# Patient Record
Sex: Male | Born: 1940
Health system: Southern US, Community
[De-identification: ages and names within clinical notes are randomized; demographics above are authoritative.]

## PROBLEM LIST (undated history)

## (undated) DIAGNOSIS — K219 Gastro-esophageal reflux disease without esophagitis: Secondary | ICD-10-CM

## (undated) DIAGNOSIS — E8809 Other disorders of plasma-protein metabolism, not elsewhere classified: Secondary | ICD-10-CM

## (undated) DIAGNOSIS — Z8719 Personal history of other diseases of the digestive system: Secondary | ICD-10-CM

## (undated) DIAGNOSIS — K2270 Barrett's esophagus without dysplasia: Secondary | ICD-10-CM

## (undated) DIAGNOSIS — R3915 Urgency of urination: Secondary | ICD-10-CM

## (undated) DIAGNOSIS — R3129 Other microscopic hematuria: Secondary | ICD-10-CM

## (undated) DIAGNOSIS — E785 Hyperlipidemia, unspecified: Secondary | ICD-10-CM

## (undated) DIAGNOSIS — R1319 Other dysphagia: Secondary | ICD-10-CM

## (undated) DIAGNOSIS — D099 Carcinoma in situ, unspecified: Secondary | ICD-10-CM

## (undated) DIAGNOSIS — R9389 Abnormal findings on diagnostic imaging of other specified body structures: Secondary | ICD-10-CM

## (undated) DIAGNOSIS — F0390 Unspecified dementia without behavioral disturbance: Secondary | ICD-10-CM

## (undated) DIAGNOSIS — N289 Disorder of kidney and ureter, unspecified: Secondary | ICD-10-CM

## (undated) DIAGNOSIS — D801 Nonfamilial hypogammaglobulinemia: Secondary | ICD-10-CM

## (undated) DIAGNOSIS — D494 Neoplasm of unspecified behavior of bladder: Secondary | ICD-10-CM

## (undated) DIAGNOSIS — C4491 Basal cell carcinoma of skin, unspecified: Secondary | ICD-10-CM

## (undated) DIAGNOSIS — R351 Nocturia: Secondary | ICD-10-CM

## (undated) DIAGNOSIS — Z972 Presence of dental prosthetic device (complete) (partial): Secondary | ICD-10-CM

## (undated) DIAGNOSIS — E538 Deficiency of other specified B group vitamins: Secondary | ICD-10-CM

## (undated) DIAGNOSIS — B370 Candidal stomatitis: Secondary | ICD-10-CM

## (undated) HISTORY — PX: ESOPHAGOGASTRODUODENOSCOPY: SHX1529

## (undated) HISTORY — DX: Nonfamilial hypogammaglobulinemia: D80.1

## (undated) HISTORY — PX: KNEE ARTHROSCOPY: SUR90

## (undated) HISTORY — DX: Gastro-esophageal reflux disease without esophagitis: K21.9

## (undated) HISTORY — PX: COLONOSCOPY: SHX174

## (undated) HISTORY — DX: Basal cell carcinoma of skin, unspecified: C44.91

## (undated) HISTORY — DX: Unspecified dementia, unspecified severity, without behavioral disturbance, psychotic disturbance, mood disturbance, and anxiety: F03.90

## (undated) HISTORY — DX: Hyperlipidemia, unspecified: E78.5

## (undated) HISTORY — DX: Other disorders of plasma-protein metabolism, not elsewhere classified: E88.09

## (undated) HISTORY — DX: Barrett's esophagus without dysplasia: K22.70

## (undated) HISTORY — PX: TONSILLECTOMY: SUR1361

## (undated) HISTORY — DX: Carcinoma in situ, unspecified: D09.9

## (undated) HISTORY — DX: Candidal stomatitis: B37.0

## (undated) HISTORY — DX: Disorder of kidney and ureter, unspecified: N28.9

## (undated) HISTORY — DX: Presence of dental prosthetic device (complete) (partial): Z97.2

---

## 2000-02-19 ENCOUNTER — Encounter (INDEPENDENT_AMBULATORY_CARE_PROVIDER_SITE_OTHER): Payer: Self-pay | Admitting: Specialist

## 2000-02-19 ENCOUNTER — Ambulatory Visit (HOSPITAL_COMMUNITY): Admission: RE | Admit: 2000-02-19 | Discharge: 2000-02-19 | Payer: Self-pay | Admitting: Gastroenterology

## 2000-05-13 ENCOUNTER — Emergency Department (HOSPITAL_COMMUNITY): Admission: EM | Admit: 2000-05-13 | Discharge: 2000-05-13 | Payer: Self-pay | Admitting: Emergency Medicine

## 2000-05-15 ENCOUNTER — Encounter: Payer: Self-pay | Admitting: Family Medicine

## 2000-05-15 ENCOUNTER — Encounter: Admission: RE | Admit: 2000-05-15 | Discharge: 2000-05-15 | Payer: Self-pay | Admitting: Family Medicine

## 2001-12-20 ENCOUNTER — Encounter: Payer: Self-pay | Admitting: Family Medicine

## 2001-12-20 ENCOUNTER — Encounter: Admission: RE | Admit: 2001-12-20 | Discharge: 2001-12-20 | Payer: Self-pay | Admitting: Family Medicine

## 2005-09-02 ENCOUNTER — Encounter (INDEPENDENT_AMBULATORY_CARE_PROVIDER_SITE_OTHER): Payer: Self-pay | Admitting: Specialist

## 2005-09-02 ENCOUNTER — Ambulatory Visit (HOSPITAL_COMMUNITY): Admission: RE | Admit: 2005-09-02 | Discharge: 2005-09-02 | Payer: Self-pay | Admitting: Gastroenterology

## 2006-10-06 ENCOUNTER — Encounter: Admission: RE | Admit: 2006-10-06 | Discharge: 2006-10-06 | Payer: Self-pay | Admitting: Family Medicine

## 2006-10-08 ENCOUNTER — Encounter: Admission: RE | Admit: 2006-10-08 | Discharge: 2006-10-08 | Payer: Self-pay | Admitting: Family Medicine

## 2006-10-14 ENCOUNTER — Encounter: Admission: RE | Admit: 2006-10-14 | Discharge: 2006-10-14 | Payer: Self-pay | Admitting: Family Medicine

## 2006-10-16 ENCOUNTER — Ambulatory Visit: Payer: Self-pay | Admitting: Critical Care Medicine

## 2006-11-02 ENCOUNTER — Ambulatory Visit: Payer: Self-pay | Admitting: Critical Care Medicine

## 2006-11-12 ENCOUNTER — Encounter: Admission: RE | Admit: 2006-11-12 | Discharge: 2006-11-12 | Payer: Self-pay | Admitting: Family Medicine

## 2006-12-07 ENCOUNTER — Ambulatory Visit (HOSPITAL_COMMUNITY): Admission: RE | Admit: 2006-12-07 | Discharge: 2006-12-07 | Payer: Self-pay | Admitting: Gastroenterology

## 2008-01-26 ENCOUNTER — Encounter: Admission: RE | Admit: 2008-01-26 | Discharge: 2008-01-26 | Payer: Self-pay | Admitting: Family Medicine

## 2009-05-02 ENCOUNTER — Ambulatory Visit (HOSPITAL_COMMUNITY): Admission: RE | Admit: 2009-05-02 | Discharge: 2009-05-02 | Payer: Self-pay | Admitting: Neurology

## 2009-05-02 ENCOUNTER — Ambulatory Visit: Payer: Self-pay | Admitting: Vascular Surgery

## 2009-05-02 ENCOUNTER — Encounter (INDEPENDENT_AMBULATORY_CARE_PROVIDER_SITE_OTHER): Payer: Self-pay | Admitting: Neurology

## 2010-09-01 ENCOUNTER — Encounter: Payer: Self-pay | Admitting: Neurology

## 2010-12-27 NOTE — Op Note (Signed)
NAMELANORRIS, KALISZ             ACCOUNT NO.:  [id number]   MEDICAL RECORD NO.:  [id number]          PATIENT TYPE:  AMB   LOCATION:  ENDO                         FACILITY:  [facility]   PHYSICIAN:  Petra Kuba, M.D.    DATE OF BIRTH:  [birth year]   DATE OF PROCEDURE:  09/02/2005  DATE OF DISCHARGE:                                 OPERATIVE REPORT   PROCEDURE:  Colonoscopy with biopsy.   INDICATIONS:  Family history of colon cancer.  Personal history of colon  polyps.  Abdominal pain.  Due for colonic screening.  Consent was signed  after risks, benefits, options were thoroughly discussed in the office on  multiple occasions.   PREMEDICATIONS:  Fentanyl  75 mcg, Versed 5 mg.   DESCRIPTION OF PROCEDURE:  Rectal inspection pertinent for external  hemorrhoids, small.  Digital exam was negative.  Video pediatric adjustable  colonoscope was inserted and easily advanced around the colon to the mid  ascending. At that point the scope began to loop.  With abdominal pressure  and rolling him on his back, we were able to advance to the cecum.  Interestingly he had a few right-sided diverticula on insertion as well as a  few on the left, but no other abnormalities on insertion.  Cecum was  identified by the appendiceal orifice and the ileocecal valve.  In fact, the  scope was inserted a short ways into the terminal ileum which was normal.  Further documentation was obtained.  The scope was slowly withdrawn.  Prep  was adequate.  There was some liquid stool that required washing and  suctioning.  In the cecal pole, a questionable __________ small bowel was  seen and was cold biopsied x3 and put in the first container.  The  diverticula were primarily in the cecum and the proximal ascending.  There  were no diverticula seen until the sigmoid.  He did have two other  questionable tiny polyps in both the mid descending and transverse, both of  which were cold biopsied and put in the second  container.  No other  abnormalities were seen.  We slowly withdrew back to the rectum.  Anorectal  pull-through and retroflexion confirmed some small hemorrhoids.  The scope  was straightened and readvanced a short ways up the left side of the colon.  Air was suctioned and the scope removed.  The patient tolerated the  procedure well.  There were no obvious immediate complications.   ENDOSCOPIC DIAGNOSES:  1.  Small internal and external hemorrhoids.  2.  Left sigmoid few diverticula.  3.  Cecal and ascending proximal, a few diverticula.  4.  A few questionable tiny to small polyps in the descending, transverse      and cecum, all cold biopsied.  5.  Otherwise within normal limits to the cecum and a quick look at the      terminal ileum.   PLAN:  Await pathology.  Will probably recheck colon screening in five  years.  Consider CT scan next if his pain continues either by me or Dr.  Artis Flock.  Happy to see him  back p.r.n.           ______________________________  Petra Kuba, M.D.     MEM/MEDQ  D:  09/02/2005  T:  09/02/2005  Job:  [id number]   cc:   Quita Skye. Artis Flock, M.D.  Fax: [phone]

## 2010-12-27 NOTE — Procedures (Signed)
[facility]  Patient:    Andrew Osborne, Andrew Osborne                    MRN: [id number] Proc. Date: 02/19/00 Adm. Date:  [id number] Disc. Date: [id number] Attending:  Nelda Marseille CC:         Fayne Norrie, M.D.                           Procedure Report  PROCEDURE:  Colonoscopy with biopsy.  INDICATIONS:  Family history of colon cancer and colon polyps due for colonic screening.  Consent was signed after risks, benefits, methods, and options were thoroughly discussed in the office.  MEDICINES USED:  Demerol 75, Versed 6.  DESCRIPTION OF PROCEDURE:  Rectal inspection was pertinent for external hemorrhoids.  Digital exam was negative.  Video colonoscope was inserted and easily advanced around the colon to the cecum.  This did not require any abdominal pressure or any position changes.  The cecum was identified by the appendiceal orifice and the ileocecal valve.  In fact, the scope was inserted a short ways into the terminal ileum which was normal on insertion in the distal sigmoid.  A tiny 1-2 mm polyp was seen and was cold biopsied x 2. Other than some rare left-sided and right-sided diverticula, no other abnormalities were seen.  The scope was slowly withdrawn.  The terminal ileum was normal. Prep was adequate.  There was some liquid stool and bubbles that required washing and suctioning.  On slow withdrawal back to the sigmoid, no additional findings were seen other than the diverticula.  There was an additional tiny sigmoid polyp which was cold-biopsied x 1 and put in the same container.  Once back in the rectum, the scope was retroflexed.  Pertinent for some internal hemorrhoids.  The scope was then straightened and advanced a short ways up the sigmoid.  Air was suctioned and the scope removed.  The patient tolerated the procedure well.  There was no obvious immediate complications.  ENDOSCOPIC DIAGNOSES: 1. Internal and external  hemorrhoids. 2. Occasional left and right diverticula. 3. Two tiny distal sigmoid polyps, status-post cold biopsy. 4. Otherwise within normal limits to the cecum and terminal ileum.  PLAN:  Yearly rectals and guaiacs per Dr. Ardyth Gal.  I will be happy to see pack p.r.n. but probably recheck in five years pending pathology.  I have rediscussed Gilberts with him and tried to explain it better.  If you would like for me to continue to be sure that is what he has, although that is from about 99.9% sure.  I have asked him to call Dr. Artis Flock and ask him to fax whatever old liver tests he has to me for my evaluation.  I will be happy to see back sooner, as above p.r.n. DD:  02/19/00 TD:  02/19/00 Job: [id number]

## 2010-12-27 NOTE — Assessment & Plan Note (Signed)
[facility]                             PULMONARY OFFICE NOTE   NAME:CONNOLLYBonner, Larue                    MRN:          [id number]  DATE:11/02/2006                            DOB:          11/02/1940    Mr. Palardy is a 70 year old white male who has a history of shortness  of breath, cough and dysphagia. During his last visit, we were concerned  he was perhaps micro aspirating. He was placed on a course of Cleocin  for 10 days at 300 mg three times daily and pulsed prednisone with taper  and Advair 500/50 one spray b.i.d. In the interim, his cough has  cleared. Mucus has cleared and his shortness of breath is better. He has  seen Dr. Leary Roca who investigating malabsorption issues and vitamin  B12 issues, but did not specifically address the dysphagia as we had  hoped for. We had originally referred him to [facility], but he has been referred instead to Dr. Ewing Schlein. I  will attempt to communicate to Dr. Ewing Schlein our concerns about his  dysphagia.   PHYSICAL EXAMINATION:  Temperature 98.0, blood pressure 128/84, pulse  86. Saturation is 94% on room air.  CHEST: Showed to be clear without evidence wheeze or rhonchi.  CARDIAC: Showed a regular rate and rhythm without S3. Normal S1, S2.  ABDOMEN: Soft, nontender.  EXTREMITIES: Showed no edema or clubbing or venous disease.  SKIN: Was clear.  NEUROLOGIC: Was intact.   Chest x-ray obtained today showed complete clearance of bilateral lower  lobe infiltrates.   IMPRESSION:  Is that of micro aspiration from chronic dysphagia. I worry  about esophageal stricture or some other primary esophageal disorder  with resultant chronic aspiration. He has certainly cleared his airways  now and no further indication for bronchoscopy is here.   PLAN:  The plan is to maintain Advair as currently dosed and no further  antibiotics or prednisone is necessary. Will see the patient back in a  months'  time. We will communicate our concerns with Dr. Ewing Schlein.     Charlcie Cradle Delford Field, MD, [facility]  Electronically Signed    PEW/MedQ  DD: 11/03/2006  DT: 11/03/2006  Job #: [id number]   cc:   Petra Kuba, M.D.  Quita Skye Artis Flock, M.D.

## 2011-07-15 HISTORY — PX: CATARACT EXTRACTION W/ INTRAOCULAR LENS IMPLANT: SHX1309

## 2011-08-27 DIAGNOSIS — R7301 Impaired fasting glucose: Secondary | ICD-10-CM | POA: Diagnosis not present

## 2011-08-27 DIAGNOSIS — E785 Hyperlipidemia, unspecified: Secondary | ICD-10-CM | POA: Diagnosis not present

## 2011-08-27 DIAGNOSIS — Z125 Encounter for screening for malignant neoplasm of prostate: Secondary | ICD-10-CM | POA: Diagnosis not present

## 2011-09-03 DIAGNOSIS — E785 Hyperlipidemia, unspecified: Secondary | ICD-10-CM | POA: Diagnosis not present

## 2011-09-03 DIAGNOSIS — R7301 Impaired fasting glucose: Secondary | ICD-10-CM | POA: Diagnosis not present

## 2011-09-03 DIAGNOSIS — K219 Gastro-esophageal reflux disease without esophagitis: Secondary | ICD-10-CM | POA: Diagnosis not present

## 2011-09-03 DIAGNOSIS — N183 Chronic kidney disease, stage 3 unspecified: Secondary | ICD-10-CM | POA: Diagnosis not present

## 2011-09-03 DIAGNOSIS — E538 Deficiency of other specified B group vitamins: Secondary | ICD-10-CM | POA: Diagnosis not present

## 2011-10-06 DIAGNOSIS — E538 Deficiency of other specified B group vitamins: Secondary | ICD-10-CM | POA: Diagnosis not present

## 2011-10-06 DIAGNOSIS — R609 Edema, unspecified: Secondary | ICD-10-CM | POA: Diagnosis not present

## 2011-11-11 DIAGNOSIS — E538 Deficiency of other specified B group vitamins: Secondary | ICD-10-CM | POA: Diagnosis not present

## 2011-11-17 ENCOUNTER — Other Ambulatory Visit: Payer: Self-pay | Admitting: Dermatology

## 2011-11-17 DIAGNOSIS — D485 Neoplasm of uncertain behavior of skin: Secondary | ICD-10-CM | POA: Diagnosis not present

## 2011-11-17 DIAGNOSIS — L851 Acquired keratosis [keratoderma] palmaris et plantaris: Secondary | ICD-10-CM | POA: Diagnosis not present

## 2011-11-17 DIAGNOSIS — D239 Other benign neoplasm of skin, unspecified: Secondary | ICD-10-CM | POA: Diagnosis not present

## 2011-12-12 DIAGNOSIS — E538 Deficiency of other specified B group vitamins: Secondary | ICD-10-CM | POA: Diagnosis not present

## 2012-01-09 DIAGNOSIS — E538 Deficiency of other specified B group vitamins: Secondary | ICD-10-CM | POA: Diagnosis not present

## 2012-02-11 DIAGNOSIS — E538 Deficiency of other specified B group vitamins: Secondary | ICD-10-CM | POA: Diagnosis not present

## 2012-03-10 DIAGNOSIS — E538 Deficiency of other specified B group vitamins: Secondary | ICD-10-CM | POA: Diagnosis not present

## 2012-03-12 DIAGNOSIS — R7301 Impaired fasting glucose: Secondary | ICD-10-CM | POA: Diagnosis not present

## 2012-03-12 DIAGNOSIS — E785 Hyperlipidemia, unspecified: Secondary | ICD-10-CM | POA: Diagnosis not present

## 2012-03-12 DIAGNOSIS — E538 Deficiency of other specified B group vitamins: Secondary | ICD-10-CM | POA: Diagnosis not present

## 2012-03-24 DIAGNOSIS — E785 Hyperlipidemia, unspecified: Secondary | ICD-10-CM | POA: Diagnosis not present

## 2012-03-24 DIAGNOSIS — R7301 Impaired fasting glucose: Secondary | ICD-10-CM | POA: Diagnosis not present

## 2012-03-24 DIAGNOSIS — K219 Gastro-esophageal reflux disease without esophagitis: Secondary | ICD-10-CM | POA: Diagnosis not present

## 2012-03-24 DIAGNOSIS — N183 Chronic kidney disease, stage 3 unspecified: Secondary | ICD-10-CM | POA: Diagnosis not present

## 2012-04-02 DIAGNOSIS — H04129 Dry eye syndrome of unspecified lacrimal gland: Secondary | ICD-10-CM | POA: Diagnosis not present

## 2012-04-02 DIAGNOSIS — H04209 Unspecified epiphora, unspecified lacrimal gland: Secondary | ICD-10-CM | POA: Diagnosis not present

## 2012-04-02 DIAGNOSIS — T8529XA Other mechanical complication of intraocular lens, initial encounter: Secondary | ICD-10-CM | POA: Diagnosis not present

## 2012-04-02 DIAGNOSIS — H11829 Conjunctivochalasis, unspecified eye: Secondary | ICD-10-CM | POA: Diagnosis not present

## 2012-04-02 DIAGNOSIS — H02839 Dermatochalasis of unspecified eye, unspecified eyelid: Secondary | ICD-10-CM | POA: Diagnosis not present

## 2012-04-02 DIAGNOSIS — H35379 Puckering of macula, unspecified eye: Secondary | ICD-10-CM | POA: Diagnosis not present

## 2012-04-02 DIAGNOSIS — H26499 Other secondary cataract, unspecified eye: Secondary | ICD-10-CM | POA: Diagnosis not present

## 2012-04-02 DIAGNOSIS — H2640 Unspecified secondary cataract: Secondary | ICD-10-CM | POA: Diagnosis not present

## 2012-04-02 DIAGNOSIS — H2589 Other age-related cataract: Secondary | ICD-10-CM | POA: Diagnosis not present

## 2012-04-02 DIAGNOSIS — H0289 Other specified disorders of eyelid: Secondary | ICD-10-CM | POA: Diagnosis not present

## 2012-04-21 ENCOUNTER — Other Ambulatory Visit: Payer: Self-pay | Admitting: Dermatology

## 2012-04-21 DIAGNOSIS — L259 Unspecified contact dermatitis, unspecified cause: Secondary | ICD-10-CM | POA: Diagnosis not present

## 2012-04-21 DIAGNOSIS — R1310 Dysphagia, unspecified: Secondary | ICD-10-CM | POA: Diagnosis not present

## 2012-04-21 DIAGNOSIS — K219 Gastro-esophageal reflux disease without esophagitis: Secondary | ICD-10-CM | POA: Diagnosis not present

## 2012-04-21 DIAGNOSIS — D485 Neoplasm of uncertain behavior of skin: Secondary | ICD-10-CM | POA: Diagnosis not present

## 2012-04-21 DIAGNOSIS — L570 Actinic keratosis: Secondary | ICD-10-CM | POA: Diagnosis not present

## 2012-04-21 DIAGNOSIS — L851 Acquired keratosis [keratoderma] palmaris et plantaris: Secondary | ICD-10-CM | POA: Diagnosis not present

## 2012-04-22 ENCOUNTER — Other Ambulatory Visit: Payer: Self-pay | Admitting: Otolaryngology

## 2012-04-22 DIAGNOSIS — R1310 Dysphagia, unspecified: Secondary | ICD-10-CM

## 2012-04-29 ENCOUNTER — Ambulatory Visit
Admission: RE | Admit: 2012-04-29 | Discharge: 2012-04-29 | Disposition: A | Discharge status: Home / Self Care | Payer: Medicare Other | Source: Ambulatory Visit | Attending: Otolaryngology | Admitting: Otolaryngology

## 2012-04-29 DIAGNOSIS — K449 Diaphragmatic hernia without obstruction or gangrene: Secondary | ICD-10-CM | POA: Diagnosis not present

## 2012-04-29 DIAGNOSIS — R1310 Dysphagia, unspecified: Secondary | ICD-10-CM

## 2012-05-07 DIAGNOSIS — R5381 Other malaise: Secondary | ICD-10-CM | POA: Diagnosis not present

## 2012-05-07 DIAGNOSIS — J069 Acute upper respiratory infection, unspecified: Secondary | ICD-10-CM | POA: Diagnosis not present

## 2012-05-07 DIAGNOSIS — R5383 Other fatigue: Secondary | ICD-10-CM | POA: Diagnosis not present

## 2012-05-07 DIAGNOSIS — R05 Cough: Secondary | ICD-10-CM | POA: Diagnosis not present

## 2012-05-07 DIAGNOSIS — J029 Acute pharyngitis, unspecified: Secondary | ICD-10-CM | POA: Diagnosis not present

## 2012-05-07 DIAGNOSIS — R059 Cough, unspecified: Secondary | ICD-10-CM | POA: Diagnosis not present

## 2012-05-20 ENCOUNTER — Other Ambulatory Visit: Payer: Self-pay | Admitting: Gastroenterology

## 2012-05-20 DIAGNOSIS — R1310 Dysphagia, unspecified: Secondary | ICD-10-CM | POA: Diagnosis not present

## 2012-05-20 DIAGNOSIS — K2100 Gastro-esophageal reflux disease with esophagitis, without bleeding: Secondary | ICD-10-CM | POA: Diagnosis not present

## 2012-05-20 DIAGNOSIS — K449 Diaphragmatic hernia without obstruction or gangrene: Secondary | ICD-10-CM | POA: Diagnosis not present

## 2012-05-20 DIAGNOSIS — Z86010 Personal history of colonic polyps: Secondary | ICD-10-CM | POA: Diagnosis not present

## 2012-05-20 DIAGNOSIS — Z09 Encounter for follow-up examination after completed treatment for conditions other than malignant neoplasm: Secondary | ICD-10-CM | POA: Diagnosis not present

## 2012-05-20 DIAGNOSIS — K5730 Diverticulosis of large intestine without perforation or abscess without bleeding: Secondary | ICD-10-CM | POA: Diagnosis not present

## 2012-05-20 DIAGNOSIS — Q398 Other congenital malformations of esophagus: Secondary | ICD-10-CM | POA: Diagnosis not present

## 2012-05-20 DIAGNOSIS — K2940 Chronic atrophic gastritis without bleeding: Secondary | ICD-10-CM | POA: Diagnosis not present

## 2012-05-24 DIAGNOSIS — E538 Deficiency of other specified B group vitamins: Secondary | ICD-10-CM | POA: Diagnosis not present

## 2012-06-23 DIAGNOSIS — E538 Deficiency of other specified B group vitamins: Secondary | ICD-10-CM | POA: Diagnosis not present

## 2012-07-26 DIAGNOSIS — E538 Deficiency of other specified B group vitamins: Secondary | ICD-10-CM | POA: Diagnosis not present

## 2012-07-26 DIAGNOSIS — Z23 Encounter for immunization: Secondary | ICD-10-CM | POA: Diagnosis not present

## 2012-08-17 DIAGNOSIS — L259 Unspecified contact dermatitis, unspecified cause: Secondary | ICD-10-CM | POA: Diagnosis not present

## 2012-08-17 DIAGNOSIS — D239 Other benign neoplasm of skin, unspecified: Secondary | ICD-10-CM | POA: Diagnosis not present

## 2012-08-25 DIAGNOSIS — E538 Deficiency of other specified B group vitamins: Secondary | ICD-10-CM | POA: Diagnosis not present

## 2012-08-27 DIAGNOSIS — N390 Urinary tract infection, site not specified: Secondary | ICD-10-CM | POA: Diagnosis not present

## 2012-08-27 DIAGNOSIS — E663 Overweight: Secondary | ICD-10-CM | POA: Diagnosis not present

## 2012-08-27 DIAGNOSIS — R7301 Impaired fasting glucose: Secondary | ICD-10-CM | POA: Diagnosis not present

## 2012-08-27 DIAGNOSIS — Z0000 Encounter for general adult medical examination without abnormal findings: Secondary | ICD-10-CM | POA: Diagnosis not present

## 2012-08-27 DIAGNOSIS — E538 Deficiency of other specified B group vitamins: Secondary | ICD-10-CM | POA: Diagnosis not present

## 2012-08-27 DIAGNOSIS — Z125 Encounter for screening for malignant neoplasm of prostate: Secondary | ICD-10-CM | POA: Diagnosis not present

## 2012-08-27 DIAGNOSIS — E785 Hyperlipidemia, unspecified: Secondary | ICD-10-CM | POA: Diagnosis not present

## 2012-09-08 ENCOUNTER — Other Ambulatory Visit: Payer: Self-pay | Admitting: Gastroenterology

## 2012-09-08 DIAGNOSIS — R7301 Impaired fasting glucose: Secondary | ICD-10-CM | POA: Diagnosis not present

## 2012-09-08 DIAGNOSIS — K219 Gastro-esophageal reflux disease without esophagitis: Secondary | ICD-10-CM | POA: Diagnosis not present

## 2012-09-08 DIAGNOSIS — R109 Unspecified abdominal pain: Secondary | ICD-10-CM | POA: Diagnosis not present

## 2012-09-08 DIAGNOSIS — R079 Chest pain, unspecified: Secondary | ICD-10-CM

## 2012-09-08 DIAGNOSIS — E785 Hyperlipidemia, unspecified: Secondary | ICD-10-CM | POA: Diagnosis not present

## 2012-09-08 DIAGNOSIS — E538 Deficiency of other specified B group vitamins: Secondary | ICD-10-CM | POA: Diagnosis not present

## 2012-09-08 DIAGNOSIS — R1310 Dysphagia, unspecified: Secondary | ICD-10-CM | POA: Diagnosis not present

## 2012-09-08 DIAGNOSIS — R82998 Other abnormal findings in urine: Secondary | ICD-10-CM | POA: Diagnosis not present

## 2012-09-09 ENCOUNTER — Ambulatory Visit
Admission: RE | Admit: 2012-09-09 | Discharge: 2012-09-09 | Disposition: A | Discharge status: Home / Self Care | Payer: Medicare Other | Source: Ambulatory Visit | Attending: Gastroenterology | Admitting: Gastroenterology

## 2012-09-09 DIAGNOSIS — J479 Bronchiectasis, uncomplicated: Secondary | ICD-10-CM | POA: Diagnosis not present

## 2012-09-09 DIAGNOSIS — K8020 Calculus of gallbladder without cholecystitis without obstruction: Secondary | ICD-10-CM | POA: Diagnosis not present

## 2012-09-09 DIAGNOSIS — R079 Chest pain, unspecified: Secondary | ICD-10-CM

## 2012-09-09 MED ORDER — IOHEXOL 300 MG/ML  SOLN
100.0000 mL | Freq: Once | INTRAMUSCULAR | Status: AC | PRN
  Administered 2012-09-09: 100 mL via INTRAVENOUS

## 2012-09-15 ENCOUNTER — Encounter (INDEPENDENT_AMBULATORY_CARE_PROVIDER_SITE_OTHER): Payer: Self-pay | Admitting: Surgery

## 2012-09-21 ENCOUNTER — Ambulatory Visit (INDEPENDENT_AMBULATORY_CARE_PROVIDER_SITE_OTHER): Payer: Medicare Other | Admitting: Surgery

## 2012-09-21 ENCOUNTER — Encounter (INDEPENDENT_AMBULATORY_CARE_PROVIDER_SITE_OTHER): Payer: Self-pay | Admitting: Surgery

## 2012-09-21 VITALS — BP 120/76 | HR 65 | Temp 99.2°F | Resp 18 | Ht 67.0 in | Wt 166.8 lb

## 2012-09-21 DIAGNOSIS — K8010 Calculus of gallbladder with chronic cholecystitis without obstruction: Secondary | ICD-10-CM | POA: Diagnosis not present

## 2012-09-21 DIAGNOSIS — E538 Deficiency of other specified B group vitamins: Secondary | ICD-10-CM | POA: Diagnosis not present

## 2012-09-21 NOTE — Progress Notes (Signed)
General [facility]  Chief Complaint  Patient presents with  . New Evaluation    eval gallstones - referral from Dr. Marc Magod    HISTORY: Patient is a 72-year-old white male referred by his gastroenterologist for evaluation for cholecystectomy for management of symptomatic cholelithiasis. Patient is symptomatic frequently with meals. While he does have some issues with dysphagia and gastroesophageal reflux, the patient describes epigastric abdominal pain. He denies nausea or vomiting. He denies jaundice or acholic stools. He has had no previous hepatobiliary or pancreatic disease. Patient has not had prior abdominal surgery.  Patient is under medical care by his gastroenterologist. He takes Prilosec twice daily. He has had an upper endoscopy and a barium swallow and a CT scan of the abdomen and pelvis. CT demonstrates multiple gallstones which have increased in number since the previous study in 2008.  Of note, the patient's brother recently underwent cholecystectomy.  Past Medical History  Diagnosis Date  . GERD (gastroesophageal reflux disease)   . Barrett's esophagus   . Hyperlipidemia      Current Outpatient Prescriptions  Medication Sig Dispense Refill  . omeprazole (PRILOSEC) 40 MG capsule Take 40 mg by mouth daily.      . sucralfate (CARAFATE) 1 G tablet Take 1 g by mouth 4 (four) times daily.      . Tamsulosin HCl (FLOMAX) 0.4 MG CAPS Take 0.4 mg by mouth daily.      . vitamin B-12 (CYANOCOBALAMIN) 1000 MCG tablet Take 1,000 mcg by mouth daily.      . aspirin 81 MG tablet Take 81 mg by mouth daily.      . fish oil-omega-3 fatty acids 1000 MG capsule Take 2 g by mouth daily.      . niacin 500 MG tablet Take 500 mg by mouth daily with breakfast.       No current facility-administered medications for this visit.     Allergies  Allergen Reactions  . Nizoral (Ketoconazole)      Family History  Problem Relation Age of Onset  . Cancer  Mother     Liver,Colon Cancer  . Alzheimer's disease Father      History   Social History  . Marital Status: Married    Spouse Name: N/A    Number of Children: N/A  . Years of Education: N/A   Social History Main Topics  . Smoking status: Never Smoker   . Smokeless tobacco: Never Used  . Alcohol Use: Yes  . Drug Use: No  . Sexually Active: None   Other Topics Concern  . None   Social History Narrative  . None     REVIEW OF SYSTEMS - PERTINENT POSITIVES ONLY: Denies jaundice. Denies acholic stools. Denies previous hepatobiliary or pancreatic disease.  EXAM: Filed Vitals:   09/21/12 0855  BP: 120/76  Pulse: 65  Temp: 99.2 F (37.3 C)  Resp: 18    HEENT: normocephalic; pupils equal and reactive; sclerae clear; dentition good; mucous membranes moist NECK:  symmetric on extension; no palpable anterior or posterior cervical lymphadenopathy; no supraclavicular masses; no tenderness CHEST: clear to auscultation bilaterally without rales, rhonchi, or wheezes CARDIAC: regular rate and rhythm without significant murmur; peripheral pulses are full ABDOMEN: soft without distension; bowel sounds present; no mass; no hepatosplenomegaly; no hernia EXT:  non-tender without edema; no deformity NEURO: no gross focal deficits; no sign of tremor   LABORATORY RESULTS: See [facility] (CHL-Epic) for most recent results   RADIOLOGY RESULTS: See   [facility] (CHL-Epic) for most recent results   IMPRESSION: #1 symptomatic cholelithiasis, probable chronic cholecystitis #2 gastroesophageal reflux disease #3 dysphagia  PLAN: The patient and I discussed the above findings at length. We reviewed his CT scan results. I provided him with written literature to review regarding cholecystectomy. We discussed the laparoscopic technique at length. We discussed his hospital stay to be anticipated. We discussed the possibility of symptom improvement but with persistent reflux  symptoms. Patient understands and wishes to proceed with laparoscopic cholecystectomy with intraoperative cholangiography in the near future. We will make arrangements for surgery.  The risks and benefits of the procedure have been discussed at length with the patient.  The patient understands the proposed procedure, potential alternative treatments, and the course of recovery to be expected.  All of the patient's questions have been answered at this time.  The patient wishes to proceed with surgery.  Latavia Goga M. Mell Mellott, MD, FACS General & Endocrine Surgery [facility]   Visit Diagnoses: 1. Cholelithiasis with cholecystitis     Primary Care Physician: MACKENZIE,BRIAN, MD   

## 2012-09-21 NOTE — Patient Instructions (Signed)
  [facility]  LAPAROSCOPIC SURGERY - POST-OP INSTRUCTIONS  Always review your discharge instruction sheet given to you by the facility where your surgery was performed.  A prescription for pain medication may be given to you upon discharge.  Take your pain medication as prescribed.  If narcotic pain medicine is not needed, then you may take acetaminophen (Tylenol) or ibuprofen (Advil) as needed.  Take your usually prescribed medications unless otherwise directed.  If you need a refill on your pain medication, please contact your pharmacy.  They will contact our office to request authorization. Prescriptions will not be filled after 5 P.M. or on weekends.  You should follow a light diet the first few days after arrival home, such as soup and crackers or toast.  Be sure to include plenty of fluids daily.  Most patients will experience some swelling and bruising in the area of the incisions.  Ice packs will help.  Swelling and bruising can take several days to resolve.   It is common to experience some constipation if taking pain medication after surgery.  Increasing fluid intake and taking a stool softener (such as Colace) will usually help or prevent this problem from occurring.  A mild laxative (Milk of Magnesia or Miralax) should be taken according to package instructions if there are no bowel movements after 48 hours.  Unless discharge instructions indicate otherwise, you may remove your bandages 24-48 hours after surgery, and you may shower at that time.  You may have steri-strips (small skin tapes) in place directly over the incision.  These strips should be left on the skin for 7-10 days.  If your surgeon used skin glue on the incision, you may shower in 24 hours.  The glue will flake off over the next 2-3 weeks.  Any sutures or staples will be removed at the office during your follow-up visit.  ACTIVITIES:  You may resume regular (light) daily activities beginning the  next day-such as daily self-care, walking, climbing stairs-gradually increasing activities as tolerated.  You may have sexual intercourse when it is comfortable.  Refrain from any heavy lifting or straining until approved by your doctor.  You may drive when you are no longer taking prescription pain medication, you can comfortably wear a seatbelt, and you can safely maneuver your car and apply brakes.  You should see your doctor in the office for a follow-up appointment approximately 2-3 weeks after your surgery.  Make sure that you call for this appointment within a day or two after you arrive home to insure a convenient appointment time.  WHEN TO CALL YOUR DOCTOR: 1. Fever over 101.0 2. Inability to urinate 3. Continued bleeding from incision 4. Increased pain, redness, or drainage from the incision 5. Increasing abdominal pain  The clinic staff is available to answer your questions during regular business hours.  Please don't hesitate to call and ask to speak to one of the nurses for clinical concerns.  If you have a medical emergency, go to the nearest emergency room or call 911.  A surgeon from [facility] is always on call for the hospital.  Andrew Osborne M. Andrew Herschberger, MD, FACS [facility] Office: [phone] Toll Free:  [phone] FAX [phone]  Web site: [email or web address] 

## 2012-09-28 DIAGNOSIS — R1310 Dysphagia, unspecified: Secondary | ICD-10-CM | POA: Diagnosis not present

## 2012-10-04 ENCOUNTER — Encounter (HOSPITAL_COMMUNITY): Payer: Self-pay | Admitting: Pharmacy Technician

## 2012-10-04 NOTE — Patient Instructions (Addendum)
20 Andrew Osborne  10/04/2012   Your procedure is scheduled on:  3-11 -2014  Report to [facility] at    0530    AM .  Call this number if you have problems the morning of surgery: [phone]  Or Presurgical Testing [phone](Shreeya Recendiz)      Do not eat food:After Midnight.    Take these medicines the morning of surgery with A SIP OF WATER: Omeprazole(Prilosec). Use  Systane eye drop.   Do not wear jewelry, make-up or nail polish.  Do not wear lotions, powders, or perfumes. You may wear deodorant.  Do not shave 12 hours prior to first CHG shower(legs and under arms).(face and neck okay.)  Do not bring valuables to the hospital.  Contacts, dentures or bridgework,body piercing,  may not be worn into surgery.  Leave suitcase in the car. After surgery it may be brought to your room.  For patients admitted to the hospital, checkout time is 11:00 AM the day of discharge.   Patients discharged the day of surgery will not be allowed to drive home. Must have responsible person with you x 24 hours once discharged.  Name and phone number of your driver: Rufus Beske, spouse [phone] cell  Special Instructions: CHG(Chlorhedine 4%-"Hibiclens","Betasept","Aplicare") Shower Use Special Wash: see special instructions.(avoid face and genitals)   Please read over the following fact sheets that you were given: MRSA Information.    Failure to follow these instructions may result in Cancellation of your surgery.   Patient signature_______________________________________________________

## 2012-10-05 ENCOUNTER — Encounter (HOSPITAL_COMMUNITY)
Admission: RE | Admit: 2012-10-05 | Discharge: 2012-10-05 | Disposition: A | Discharge status: Home / Self Care | Payer: Medicare Other | Source: Ambulatory Visit | Attending: Surgery | Admitting: Surgery

## 2012-10-05 ENCOUNTER — Telehealth (INDEPENDENT_AMBULATORY_CARE_PROVIDER_SITE_OTHER): Payer: Self-pay

## 2012-10-05 ENCOUNTER — Encounter (HOSPITAL_COMMUNITY): Payer: Self-pay

## 2012-10-05 ENCOUNTER — Ambulatory Visit (HOSPITAL_COMMUNITY)
Admission: RE | Admit: 2012-10-05 | Discharge: 2012-10-05 | Disposition: A | Discharge status: Home / Self Care | Payer: Medicare Other | Source: Ambulatory Visit | Attending: Surgery | Admitting: Surgery

## 2012-10-05 DIAGNOSIS — Z01812 Encounter for preprocedural laboratory examination: Secondary | ICD-10-CM | POA: Insufficient documentation

## 2012-10-05 DIAGNOSIS — E785 Hyperlipidemia, unspecified: Secondary | ICD-10-CM

## 2012-10-05 DIAGNOSIS — R0602 Shortness of breath: Secondary | ICD-10-CM | POA: Diagnosis not present

## 2012-10-05 DIAGNOSIS — Z01818 Encounter for other preprocedural examination: Secondary | ICD-10-CM | POA: Insufficient documentation

## 2012-10-05 DIAGNOSIS — R0609 Other forms of dyspnea: Secondary | ICD-10-CM | POA: Diagnosis not present

## 2012-10-05 DIAGNOSIS — R059 Cough, unspecified: Secondary | ICD-10-CM | POA: Diagnosis not present

## 2012-10-05 HISTORY — PX: CATARACT EXTRACTION: SUR2

## 2012-10-05 HISTORY — DX: Hyperlipidemia, unspecified: E78.5

## 2012-10-05 HISTORY — DX: Abnormal findings on diagnostic imaging of other specified body structures: R93.89

## 2012-10-05 LAB — CBC
Hemoglobin: 15.7 g/dL (ref 13.0–17.0)
MCH: 31.2 pg (ref 26.0–34.0)
RBC: 5.04 MIL/uL (ref 4.22–5.81)

## 2012-10-05 LAB — COMPREHENSIVE METABOLIC PANEL
ALT: 13 U/L (ref 0–53)
AST: 23 U/L (ref 0–37)
Albumin: 3.4 g/dL — ABNORMAL LOW (ref 3.5–5.2)
CO2: 27 mEq/L (ref 19–32)
Calcium: 8.8 mg/dL (ref 8.4–10.5)
Chloride: 104 mEq/L (ref 96–112)
GFR calc non Af Amer: 54 mL/min — ABNORMAL LOW (ref >90)
Sodium: 139 mEq/L (ref 135–145)

## 2012-10-05 LAB — SURGICAL PCR SCREEN
MRSA, PCR: NEGATIVE
Staphylococcus aureus: NEGATIVE

## 2012-10-05 NOTE — Pre-Procedure Instructions (Addendum)
CT Chest 1'14-noted in Epic-results reviewed by Dr. Okey Dupre. States have CXR done today-inform Dr. Gerrit Friends PFT needed preop prior to surgery. Marland Kitchen Spoke with Dr. Cathrine Muster office" Farley Ly". WKennon Portela 10-05-12 Additional note sent to Dr. Ardine Eng in basket- making him aware Dr. Okey Dupre desires PFT done prior to surgery planned 10-19-12. WKennon Portela 10-08-12 1600 2nd note to dr. Ardine Eng in basket-preop PFT desired by anesthesia preop.W. Jordi Kamm,RN 10-12-12 1630- PFT report received and placed with patients chart. W. Kennon Portela

## 2012-10-05 NOTE — Progress Notes (Signed)
Quick Note:  These results are acceptable for scheduled surgery.  Natalee Tomkiewicz M. Jadriel Saxer, MD, FACS [facility] Office: [phone]   ______ 

## 2012-10-05 NOTE — Telephone Encounter (Signed)
Per Leafy Half at [facility] pre surg called stating Dr Okey Dupre requires pt to have pulmonary function test prior to his GB surgery on 10-19-12. I called Dr Dimas Millin office since he is pts PCP. I spoke with Trula Ore at his office and she states they do this test in their office. She is sending msg to Dr Dimas Millin nurse re: setting up test asap. Pt notified of need for test prior to surgery and is awaiting call back with date and time of test.

## 2012-10-05 NOTE — Progress Notes (Signed)
10-05-12 -Saw your note Dr. Gerrit Friends- Be aware Dr. Okey Dupre, anesthesia desires patient to have a Pulmonary Function test before having surgery on 10-19-12. W. Kennon Portela

## 2012-10-05 NOTE — Telephone Encounter (Signed)
Received call from Marylene Land at Dr. Dimas Millin office; stated pt "happened to walk-in" to their office and they are performing his PFTs this afternoon.

## 2012-10-06 ENCOUNTER — Encounter (INDEPENDENT_AMBULATORY_CARE_PROVIDER_SITE_OTHER): Payer: Self-pay

## 2012-10-08 NOTE — Progress Notes (Signed)
10-08-12 Dr. Gerrit Friends- Pulmonary Function test needs to be scheduled by your office preop -Dr. Sandford Craze -desires patient to have this preop prior to 10-19-12 surgery. W.Kalup Jaquith,RN

## 2012-10-12 DIAGNOSIS — R350 Frequency of micturition: Secondary | ICD-10-CM | POA: Diagnosis not present

## 2012-10-12 DIAGNOSIS — R3129 Other microscopic hematuria: Secondary | ICD-10-CM | POA: Diagnosis not present

## 2012-10-12 DIAGNOSIS — R82998 Other abnormal findings in urine: Secondary | ICD-10-CM | POA: Diagnosis not present

## 2012-10-12 DIAGNOSIS — N402 Nodular prostate without lower urinary tract symptoms: Secondary | ICD-10-CM | POA: Diagnosis not present

## 2012-10-18 NOTE — Anesthesia Preprocedure Evaluation (Addendum)
Anesthesia Evaluation  Patient identified by MRN, date of birth, ID band Patient awake    Reviewed: Allergy & Precautions, H&P , NPO status , Patient's Chart, lab work & pertinent test results, reviewed documented beta blocker date and time   Airway Mallampati: II TM Distance: >3 FB Neck ROM: full    Dental no notable dental hx.    Pulmonary neg pulmonary ROS,  breath sounds clear to auscultation  Pulmonary exam normal       Cardiovascular Exercise Tolerance: Good negative cardio ROS  Rhythm:regular Rate:Normal     Neuro/Psych negative neurological ROS  negative psych ROS   GI/Hepatic negative GI ROS, Neg liver ROS, GERD-  Medicated,  Endo/Other  negative endocrine ROS  Renal/GU Renal diseaseBright's disease.  negative genitourinary   Musculoskeletal   Abdominal   Peds  Hematology negative hematology ROS (+)   Anesthesia Other Findings   Reproductive/Obstetrics negative OB ROS                           Anesthesia Physical Anesthesia Plan  ASA: II  Anesthesia Plan: General ETT   Post-op Pain Management:    Induction: Intravenous  Airway Management Planned:   Additional Equipment:   Intra-op Plan:   Post-operative Plan: Extubation in OR  Informed Consent: I have reviewed the patients History and Physical, chart, labs and discussed the procedure including the risks, benefits and alternatives for the proposed anesthesia with the patient or authorized representative who has indicated his/her understanding and acceptance.   Dental Advisory Given  Plan Discussed with: CRNA  Anesthesia Plan Comments:         Anesthesia Quick Evaluation

## 2012-10-19 ENCOUNTER — Ambulatory Visit (HOSPITAL_COMMUNITY): Payer: Medicare Other

## 2012-10-19 ENCOUNTER — Encounter (HOSPITAL_COMMUNITY): Payer: Self-pay | Admitting: *Deleted

## 2012-10-19 ENCOUNTER — Ambulatory Visit (HOSPITAL_COMMUNITY): Payer: Medicare Other | Admitting: Anesthesiology

## 2012-10-19 ENCOUNTER — Encounter (HOSPITAL_COMMUNITY): Payer: Self-pay | Admitting: Anesthesiology

## 2012-10-19 ENCOUNTER — Encounter (HOSPITAL_COMMUNITY)
Admission: RE | Disposition: A | Discharge status: Home / Self Care | Payer: Self-pay | Source: Ambulatory Visit | Attending: Surgery

## 2012-10-19 ENCOUNTER — Observation Stay (HOSPITAL_COMMUNITY)
Admission: RE | Admit: 2012-10-19 | Discharge: 2012-10-19 | Disposition: A | Discharge status: Home / Self Care | Payer: Medicare Other | Source: Ambulatory Visit | Attending: Surgery | Admitting: Surgery

## 2012-10-19 DIAGNOSIS — Z79899 Other long term (current) drug therapy: Secondary | ICD-10-CM | POA: Diagnosis not present

## 2012-10-19 DIAGNOSIS — K219 Gastro-esophageal reflux disease without esophagitis: Secondary | ICD-10-CM | POA: Diagnosis not present

## 2012-10-19 DIAGNOSIS — K8020 Calculus of gallbladder without cholecystitis without obstruction: Secondary | ICD-10-CM | POA: Diagnosis not present

## 2012-10-19 DIAGNOSIS — K2270 Barrett's esophagus without dysplasia: Secondary | ICD-10-CM | POA: Diagnosis not present

## 2012-10-19 DIAGNOSIS — R1310 Dysphagia, unspecified: Secondary | ICD-10-CM | POA: Insufficient documentation

## 2012-10-19 DIAGNOSIS — K8010 Calculus of gallbladder with chronic cholecystitis without obstruction: Secondary | ICD-10-CM | POA: Diagnosis not present

## 2012-10-19 DIAGNOSIS — N059 Unspecified nephritic syndrome with unspecified morphologic changes: Secondary | ICD-10-CM | POA: Insufficient documentation

## 2012-10-19 DIAGNOSIS — R1011 Right upper quadrant pain: Secondary | ICD-10-CM | POA: Diagnosis not present

## 2012-10-19 DIAGNOSIS — E785 Hyperlipidemia, unspecified: Secondary | ICD-10-CM | POA: Diagnosis not present

## 2012-10-19 HISTORY — PX: CHOLECYSTECTOMY: SHX55

## 2012-10-19 SURGERY — LAPAROSCOPIC CHOLECYSTECTOMY WITH INTRAOPERATIVE CHOLANGIOGRAM
Anesthesia: General | Site: Abdomen | Wound class: Clean

## 2012-10-19 MED ORDER — HYDROMORPHONE HCL PF 1 MG/ML IJ SOLN
0.2500 mg | INTRAMUSCULAR | Status: DC | PRN
  Administered 2012-10-19 (×2): 0.5 mg via INTRAVENOUS

## 2012-10-19 MED ORDER — FENTANYL CITRATE 0.05 MG/ML IJ SOLN
INTRAMUSCULAR | Status: DC | PRN
  Administered 2012-10-19 (×3): 50 ug via INTRAVENOUS

## 2012-10-19 MED ORDER — LIDOCAINE HCL (CARDIAC) 20 MG/ML IV SOLN
INTRAVENOUS | Status: DC | PRN
  Administered 2012-10-19: 80 mg via INTRAVENOUS

## 2012-10-19 MED ORDER — EPHEDRINE SULFATE 50 MG/ML IJ SOLN
INTRAMUSCULAR | Status: DC | PRN
  Administered 2012-10-19: 5 mg via INTRAVENOUS

## 2012-10-19 MED ORDER — HYDROCODONE-ACETAMINOPHEN 5-325 MG PO TABS: 1.0000 | ORAL_TABLET | ORAL | Status: DC | PRN

## 2012-10-19 MED ORDER — LACTATED RINGERS IV SOLN
INTRAVENOUS | Status: DC | PRN
  Administered 2012-10-19: 07:00:00 via INTRAVENOUS

## 2012-10-19 MED ORDER — PROMETHAZINE HCL 25 MG/ML IJ SOLN: 12.5000 mg | INTRAMUSCULAR | Status: DC | PRN

## 2012-10-19 MED ORDER — GLYCOPYRROLATE 0.2 MG/ML IJ SOLN
INTRAMUSCULAR | Status: DC | PRN
  Administered 2012-10-19: 0.6 mg via INTRAVENOUS

## 2012-10-19 MED ORDER — TAMSULOSIN HCL 0.4 MG PO CAPS
0.8000 mg | ORAL_CAPSULE | Freq: Every day | ORAL | Status: DC
  Filled 2012-10-19 (×2): qty 2

## 2012-10-19 MED ORDER — MIDAZOLAM HCL 5 MG/5ML IJ SOLN
INTRAMUSCULAR | Status: DC | PRN
  Administered 2012-10-19: 2 mg via INTRAVENOUS

## 2012-10-19 MED ORDER — ACETAMINOPHEN 10 MG/ML IV SOLN
INTRAVENOUS | Status: DC | PRN
  Administered 2012-10-19: 1000 mg via INTRAVENOUS

## 2012-10-19 MED ORDER — PROMETHAZINE HCL 25 MG/ML IJ SOLN: 6.2500 mg | INTRAMUSCULAR | Status: DC | PRN

## 2012-10-19 MED ORDER — HYDROMORPHONE HCL PF 1 MG/ML IJ SOLN: 1.0000 mg | INTRAMUSCULAR | Status: DC | PRN

## 2012-10-19 MED ORDER — PROPOFOL 10 MG/ML IV BOLUS
INTRAVENOUS | Status: DC | PRN
  Administered 2012-10-19: 160 mg via INTRAVENOUS

## 2012-10-19 MED ORDER — PANTOPRAZOLE SODIUM 40 MG PO TBEC
40.0000 mg | DELAYED_RELEASE_TABLET | Freq: Every day | ORAL | Status: DC
  Filled 2012-10-19: qty 1

## 2012-10-19 MED ORDER — BUPIVACAINE-EPINEPHRINE 0.5% -1:200000 IJ SOLN
INTRAMUSCULAR | Status: DC | PRN
  Administered 2012-10-19: 20 mL

## 2012-10-19 MED ORDER — ONDANSETRON HCL 4 MG/2ML IJ SOLN
INTRAMUSCULAR | Status: DC | PRN
  Administered 2012-10-19: 4 mg via INTRAVENOUS

## 2012-10-19 MED ORDER — BUPIVACAINE-EPINEPHRINE (PF) 0.5% -1:200000 IJ SOLN
INTRAMUSCULAR | Status: AC
  Filled 2012-10-19: qty 10

## 2012-10-19 MED ORDER — ACETAMINOPHEN 325 MG PO TABS: 650.0000 mg | ORAL_TABLET | ORAL | Status: DC | PRN

## 2012-10-19 MED ORDER — LACTATED RINGERS IR SOLN
Status: DC | PRN
  Administered 2012-10-19: 1000 mL

## 2012-10-19 MED ORDER — HYDROMORPHONE HCL PF 1 MG/ML IJ SOLN
INTRAMUSCULAR | Status: AC
  Filled 2012-10-19: qty 1

## 2012-10-19 MED ORDER — NEOSTIGMINE METHYLSULFATE 1 MG/ML IJ SOLN
INTRAMUSCULAR | Status: DC | PRN
  Administered 2012-10-19: 4 mg via INTRAVENOUS

## 2012-10-19 MED ORDER — 0.9 % SODIUM CHLORIDE (POUR BTL) OPTIME
TOPICAL | Status: DC | PRN
  Administered 2012-10-19: 1000 mL

## 2012-10-19 MED ORDER — IOHEXOL 300 MG/ML  SOLN
INTRAMUSCULAR | Status: AC
  Filled 2012-10-19: qty 1

## 2012-10-19 MED ORDER — IOHEXOL 300 MG/ML  SOLN
INTRAMUSCULAR | Status: DC | PRN
  Administered 2012-10-19: 6.5 mL via INTRAVENOUS

## 2012-10-19 MED ORDER — ROCURONIUM BROMIDE 100 MG/10ML IV SOLN
INTRAVENOUS | Status: DC | PRN
  Administered 2012-10-19: 10 mg via INTRAVENOUS
  Administered 2012-10-19: 40 mg via INTRAVENOUS

## 2012-10-19 MED ORDER — KCL IN DEXTROSE-NACL 20-5-0.45 MEQ/L-%-% IV SOLN
INTRAVENOUS | Status: DC
  Administered 2012-10-19: 12:00:00 via INTRAVENOUS
  Filled 2012-10-19: qty 1000

## 2012-10-19 MED ORDER — CEFAZOLIN SODIUM-DEXTROSE 2-3 GM-% IV SOLR
2.0000 g | INTRAVENOUS | Status: AC
  Administered 2012-10-19: 2 g via INTRAVENOUS

## 2012-10-19 SURGICAL SUPPLY — 41 items
APL SKNCLS STERI-STRIP NONHPOA (GAUZE/BANDAGES/DRESSINGS) ×1
APPLIER CLIP ROT 10 11.4 M/L (STAPLE) ×2
APR CLP MED LRG 11.4X10 (STAPLE) ×1
BAG SPEC RTRVL LRG 6X4 10 (ENDOMECHANICALS) ×1
BENZOIN TINCTURE PRP APPL 2/3 (GAUZE/BANDAGES/DRESSINGS) ×2 IMPLANT
CABLE HIGH FREQUENCY MONO STRZ (ELECTRODE) ×2 IMPLANT
CANISTER SUCTION 2500CC (MISCELLANEOUS) ×2 IMPLANT
CHLORAPREP W/TINT 26ML (MISCELLANEOUS) ×2 IMPLANT
CLIP APPLIE ROT 10 11.4 M/L (STAPLE) ×1 IMPLANT
CLOTH BEACON ORANGE TIMEOUT ST (SAFETY) ×2 IMPLANT
COVER MAYO STAND STRL (DRAPES) ×2 IMPLANT
DECANTER SPIKE VIAL GLASS SM (MISCELLANEOUS) ×2 IMPLANT
DRAPE C-ARM 42X72 X-RAY (DRAPES) ×2 IMPLANT
DRAPE LAPAROSCOPIC ABDOMINAL (DRAPES) ×2 IMPLANT
DRAPE UTILITY 15X26 (DRAPE) ×2 IMPLANT
ELECT REM PT RETURN 9FT ADLT (ELECTROSURGICAL) ×2
ELECTRODE REM PT RTRN 9FT ADLT (ELECTROSURGICAL) ×1 IMPLANT
GAUZE SPONGE 2X2 8PLY STRL LF (GAUZE/BANDAGES/DRESSINGS) IMPLANT
GLOVE BIOGEL PI IND STRL 7.0 (GLOVE) ×1 IMPLANT
GLOVE BIOGEL PI INDICATOR 7.0 (GLOVE) ×1
GLOVE SURG ORTHO 8.0 STRL STRW (GLOVE) ×2 IMPLANT
GOWN STRL NON-REIN LRG LVL3 (GOWN DISPOSABLE) ×2 IMPLANT
GOWN STRL REIN XL XLG (GOWN DISPOSABLE) ×4 IMPLANT
HEMOSTAT SURGICEL 4X8 (HEMOSTASIS) IMPLANT
KIT BASIN OR (CUSTOM PROCEDURE TRAY) ×2 IMPLANT
NS IRRIG 1000ML POUR BTL (IV SOLUTION) ×2 IMPLANT
POUCH SPECIMEN RETRIEVAL 10MM (ENDOMECHANICALS) ×2 IMPLANT
SCISSORS LAP 5X35 DISP (ENDOMECHANICALS) ×1 IMPLANT
SET CHOLANGIOGRAPH MIX (MISCELLANEOUS) ×2 IMPLANT
SET IRRIG TUBING LAPAROSCOPIC (IRRIGATION / IRRIGATOR) ×2 IMPLANT
SLEEVE Z-THREAD 5X100MM (TROCAR) ×2 IMPLANT
SOLUTION ANTI FOG 6CC (MISCELLANEOUS) ×2 IMPLANT
SPONGE GAUZE 2X2 STER 10/PKG (GAUZE/BANDAGES/DRESSINGS) ×1
STRIP CLOSURE SKIN 1/2X4 (GAUZE/BANDAGES/DRESSINGS) ×2 IMPLANT
SUT MNCRL AB 4-0 PS2 18 (SUTURE) ×2 IMPLANT
TOWEL OR 17X26 10 PK STRL BLUE (TOWEL DISPOSABLE) ×6 IMPLANT
TRAY LAP CHOLE (CUSTOM PROCEDURE TRAY) ×2 IMPLANT
TROCAR XCEL BLUNT TIP 100MML (ENDOMECHANICALS) ×2 IMPLANT
TROCAR Z-THREAD FIOS 11X100 BL (TROCAR) ×2 IMPLANT
TROCAR Z-THREAD FIOS 5X100MM (TROCAR) ×4 IMPLANT
TUBING INSUFFLATION 10FT LAP (TUBING) ×2 IMPLANT

## 2012-10-19 NOTE — Anesthesia Postprocedure Evaluation (Signed)
  Anesthesia Post-op Note  Patient: Andrew Osborne  Procedure(s) Performed: Procedure(s) (LRB): LAPAROSCOPIC CHOLECYSTECTOMY WITH INTRAOPERATIVE CHOLANGIOGRAM (N/A)  Patient Location: PACU  Anesthesia Type: General  Level of Consciousness: awake and alert   Airway and Oxygen Therapy: Patient Spontanous Breathing  Post-op Pain: mild  Post-op Assessment: Post-op Vital signs reviewed, Patient's Cardiovascular Status Stable, Respiratory Function Stable, Patent Airway and No signs of Nausea or vomiting  Last Vitals:  Filed Vitals:   10/19/12 1000  BP: 121/74  Pulse: 59  Temp: 36.4 C  Resp: 14    Post-op Vital Signs: stable   Complications: No apparent anesthesia complications

## 2012-10-19 NOTE — H&P (View-Only) (Signed)
General Surgery [facility]  Chief Complaint  Patient presents with  . New Evaluation    eval gallstones - referral from Dr. Vida Rigger    HISTORY: Patient is a 72 year old white male referred by his gastroenterologist for evaluation for cholecystectomy for management of symptomatic cholelithiasis. Patient is symptomatic frequently with meals. While he does have some issues with dysphagia and gastroesophageal reflux, the patient describes epigastric abdominal pain. He denies nausea or vomiting. He denies jaundice or acholic stools. He has had no previous hepatobiliary or pancreatic disease. Patient has not had prior abdominal surgery.  Patient is under medical care by his gastroenterologist. He takes Prilosec twice daily. He has had an upper endoscopy and a barium swallow and a CT scan of the abdomen and pelvis. CT demonstrates multiple gallstones which have increased in number since the previous study in 2008.  Of note, the patient's brother recently underwent cholecystectomy.  Past Medical History  Diagnosis Date  . GERD (gastroesophageal reflux disease)   . Barrett's esophagus   . Hyperlipidemia      Current Outpatient Prescriptions  Medication Sig Dispense Refill  . omeprazole (PRILOSEC) 40 MG capsule Take 40 mg by mouth daily.      . sucralfate (CARAFATE) 1 G tablet Take 1 g by mouth 4 (four) times daily.      . Tamsulosin HCl (FLOMAX) 0.4 MG CAPS Take 0.4 mg by mouth daily.      . vitamin B-12 (CYANOCOBALAMIN) 1000 MCG tablet Take 1,000 mcg by mouth daily.      Marland Kitchen aspirin 81 MG tablet Take 81 mg by mouth daily.      . fish oil-omega-3 fatty acids 1000 MG capsule Take 2 g by mouth daily.      . niacin 500 MG tablet Take 500 mg by mouth daily with breakfast.       No current facility-administered medications for this visit.     Allergies  Allergen Reactions  . Nizoral (Ketoconazole)      Family History  Problem Relation Age of Onset  . Cancer  Mother     Liver,Colon Cancer  . Alzheimer's disease Father      History   Social History  . Marital Status: Married    Spouse Name: N/A    Number of Children: N/A  . Years of Education: N/A   Social History Main Topics  . Smoking status: Never Smoker   . Smokeless tobacco: Never Used  . Alcohol Use: Yes  . Drug Use: No  . Sexually Active: None   Other Topics Concern  . None   Social History Narrative  . None     REVIEW OF SYSTEMS - PERTINENT POSITIVES ONLY: Denies jaundice. Denies acholic stools. Denies previous hepatobiliary or pancreatic disease.  EXAM: Filed Vitals:   09/21/12 0855  BP: 120/76  Pulse: 65  Temp: 99.2 F (37.3 C)  Resp: 18    HEENT: normocephalic; pupils equal and reactive; sclerae clear; dentition good; mucous membranes moist NECK:  symmetric on extension; no palpable anterior or posterior cervical lymphadenopathy; no supraclavicular masses; no tenderness CHEST: clear to auscultation bilaterally without rales, rhonchi, or wheezes CARDIAC: regular rate and rhythm without significant murmur; peripheral pulses are full ABDOMEN: soft without distension; bowel sounds present; no mass; no hepatosplenomegaly; no hernia EXT:  non-tender without edema; no deformity NEURO: no gross focal deficits; no sign of tremor   LABORATORY RESULTS: See [facility] (CHL-Epic) for most recent results   RADIOLOGY RESULTS: See  [facility] (CHL-Epic) for most recent results   IMPRESSION: #1 symptomatic cholelithiasis, probable chronic cholecystitis #2 gastroesophageal reflux disease #3 dysphagia  PLAN: The patient and I discussed the above findings at length. We reviewed his CT scan results. I provided him with written literature to review regarding cholecystectomy. We discussed the laparoscopic technique at length. We discussed his hospital stay to be anticipated. We discussed the possibility of symptom improvement but with persistent reflux  symptoms. Patient understands and wishes to proceed with laparoscopic cholecystectomy with intraoperative cholangiography in the near future. We will make arrangements for surgery.  The risks and benefits of the procedure have been discussed at length with the patient.  The patient understands the proposed procedure, potential alternative treatments, and the course of recovery to be expected.  All of the patient's questions have been answered at this time.  The patient wishes to proceed with surgery.  Velora Heckler, MD, FACS General & Endocrine Surgery [facility]   Visit Diagnoses: 1. Cholelithiasis with cholecystitis     Primary Care Physician: Thayer Headings, MD

## 2012-10-19 NOTE — Progress Notes (Signed)
Patient states he feels good and requested to be discharged. Dr. Carolynne Edouard paged but is not on this campus and unable to come at this time. Awaiting call from PA or MD for discharge orders/instructions. Informed patient about delay.C.Foecking,RN

## 2012-10-19 NOTE — Op Note (Signed)
Procedure Note  Pre-operative Diagnosis: Calculus of gallbladder with other cholecystitis, without mention of obstruction  Post-operative Diagnosis: Same  Surgeon:  Velora Heckler, MD, FACS  Procedure:  Laparoscopic cholecystectomy with intra-operative cholangiography  Assistant:  None   Anesthesia:  General  Indications: This patient presents with symptomatic gallbladder disease and will undergo laparoscopic cholecystectomy with intraoperative cholangiography.  Procedure Details: The patient was seen in the pre-op holding area. The risks, benefits, complications, treatment options, and expected outcomes have been discussed with the patient. The patient and/or family agreed with the proposed plan and signed the informed consent form.  The patient was taken to Operating Room, identified as Andrew Osborne and the procedure verified as Laparoscopic Cholecystectomy with Intraoperative Cholangiogram. A "time out" was completed and the above information confirmed.  Prior to the induction of general anesthesia, antibiotic prophylaxis was administered. General endotracheal anesthesia was then administered and tolerated well. After the induction, the abdomen was prepped in the usual strict aseptic fashion. The patient was in the supine position.  An incision was made in the skin near the umbilicus. The midline fascia was incised and the peritoneal cavity entered and the Hasson canula was introduced under direct vision. Hasson canula was secured with a pursestring 0-Vicryl suture. Pneumoperitoneum was then established with carbon dioxide and tolerated well without any adverse changes in the patient's vital signs. Additional trocars were introduced under direct vision along the right costal margin in the midline, mid-clavicular line, and anterior axillary line.  The gallbladder was identified and the fundus grasped and retracted cephalad. Adhesions were taken down bluntly and with the  electrocautery as needed, taking care not to injure any adjacent structures. The infundibulum was grasped and retracted laterally, exposing the peritoneum overlying the triangle of Calot. This was incised and structures exposed in a blunt fashion. The cystic duct was clearly identified and bluntly dissected circumferentially and clipped at the neck of the gallbladder.  An incision was made in the cystic duct and the cholangiogram catheter introduced. The catheter was secured using an ligaclip.  Real-time cholangiography was performed using the C-arm.  There was rapid filling of a normal caliber common bile duct.  There was reflux of contrast into the left and right hepatic ductal systems.  There was free flow distally into the duodenum without filling defect or obstruction.  Catheter was removed from the peritoneal cavity.  The cystic duct was then triply ligated with surgical clips and divided. The cystic artery was identified, dissected circumferentially, ligated with ligaclips, and divided.   The gallbladder was dissected from the liver bed with the electrocautery used for hemostasis. The gallbladder was completely removed and placed into an endocatch bag. The right upper quadrant was irrigated and inspected. Hemostasis was achieved with the electrocautery. Warm saline irrigation was utilized and was repeatedly aspirated until clear.  Pneumoperitoneum was released after viewing removal of the trocars with good hemostasis noted. The umbilical wound was irrigated and the fascia was then closed with the pursestring suture.  The skin was then closed with 4-0 Monocril subcuticular sutures and sterile dressings were applied.  Instrument, sponge, and needle counts were correct at the conclusion of the case.  The patient tolerated the procedure well.  Estimated Blood Loss: Minimal         Drains: None         Specimens: Gallbladder to pathology         Disposition: PACU - hemodynamically stable.  Condition: stable   Velora Heckler, MD, [facility] Office: [phone]

## 2012-10-19 NOTE — Transfer of Care (Signed)
Immediate Anesthesia Transfer of Care Note  Patient: Andrew Osborne  Procedure(s) Performed: Procedure(s): LAPAROSCOPIC CHOLECYSTECTOMY WITH INTRAOPERATIVE CHOLANGIOGRAM (N/A)  Patient Location: PACU  Anesthesia Type:General  Level of Consciousness: awake, alert , oriented and patient cooperative  Airway & Oxygen Therapy: Patient Spontanous Breathing and Patient connected to face mask oxygen  Post-op Assessment: Report given to PACU RN, Post -op Vital signs reviewed and stable and Patient moving all extremities  Post vital signs: Reviewed and stable  Complications: No apparent anesthesia complications

## 2012-10-19 NOTE — Preoperative (Signed)
Beta Blockers   Reason not to administer Beta Blockers:Not Applicable 

## 2012-10-19 NOTE — Progress Notes (Signed)
Patient discharged home. Verbalized d/c instructions. Left via wheelchair. IV d/c'd cath intact.

## 2012-10-19 NOTE — Interval H&P Note (Signed)
History and Physical Interval Note:  10/19/2012 7:51 AM  Andrew Osborne  has presented today for surgery, with the diagnosis of symptomatic cholelithiasis, chronic cholecystitis.  The various methods of treatment have been discussed with the patient and family. After consideration of risks, benefits and other options for treatment, the patient has consented to    Procedure(s): LAPAROSCOPIC CHOLECYSTECTOMY WITH INTRAOPERATIVE CHOLANGIOGRAM (N/A) as a surgical intervention .    The patient's history has been reviewed, patient examined, no change in status, stable for surgery.  I have reviewed the patient's chart and labs.  Questions were answered to the patient's satisfaction.    Velora Heckler, MD, [facility] Office: [phone]    GERKIN,[name]

## 2012-10-20 ENCOUNTER — Encounter (HOSPITAL_COMMUNITY): Payer: Self-pay | Admitting: Surgery

## 2012-10-21 NOTE — Discharge Summary (Signed)
Physician Discharge Summary [facility]  Patient ID: Andrew Osborne MRN: [id number] DOB/AGE: May 17, 1941 72 y.o.  Admit date: 10/19/2012 Discharge date: 10/19/2012   Admission Diagnoses:  Symptomatic cholelithiasis  Discharge Diagnoses:  Active Problems:   * No active hospital problems. *   Discharged Condition: good  Hospital Course: patient admitted for observation after lap chole with IOC.  Did well.  Tolerated diet.  Ambulated.  Voided.  Prepared for discharge on day of surgery.  Consults: None  Significant Diagnostic Studies: none  Treatments: surgery: lap chole with IOC  Discharge Exam: Blood pressure 128/74, pulse 57, temperature 97.6 F (36.4 C), temperature source Oral, resp. rate 20, height 5\' 7"  (1.702 m), weight 167 lb 8 oz (75.978 kg), SpO2 97.00%. HEENT - clear Chest - clear Abd - dressings dry and intact  Disposition: Home with family  Discharge Orders   Future Appointments Provider Department Dept Phone   11/03/2012 9:30 AM Velora Heckler, MD [facility], [place] [phone]   Future Orders Complete By Expires     Call MD for:  difficulty breathing, headache or visual disturbances  As directed     Call MD for:  extreme fatigue  As directed     Call MD for:  hives  As directed     Call MD for:  persistant dizziness or light-headedness  As directed     Call MD for:  persistant nausea and vomiting  As directed     Call MD for:  redness, tenderness, or signs of infection (pain, swelling, redness, odor or green/yellow discharge around incision site)  As directed     Call MD for:  severe uncontrolled pain  As directed     Call MD for:  temperature >100.4  As directed     Diet - low sodium heart healthy  As directed     Increase activity slowly  As directed     No wound care  As directed         Medication List    TAKE these medications       aspirin 81 MG tablet  Take 81 mg by mouth daily.     FISH OIL PO  Take 2  capsules by mouth 2 (two) times daily.     naproxen sodium 220 MG tablet  Commonly known as:  ANAPROX  Take 220 mg by mouth as needed (pain).     niacin 500 MG tablet  Take 500 mg by mouth daily with breakfast.     omeprazole 40 MG capsule  Commonly known as:  PRILOSEC  Take 40 mg by mouth 2 (two) times daily.     sucralfate 1 G tablet  Commonly known as:  CARAFATE  Take 1 g by mouth 4 (four) times daily.     SYSTANE BALANCE 0.6 % Soln  Generic drug:  Propylene Glycol  Place 1 drop into both eyes daily.     tamsulosin 0.4 MG Caps  Commonly known as:  FLOMAX  Take 0.8 mg by mouth daily after supper.     VITAMIN B-12 IJ  Inject as directed every 21 ( twenty-one) days.           Follow-up Information   Follow up with Velora Heckler, MD. Schedule an appointment as soon as possible for a visit in 3 weeks.   Contact information:   [address] [place] [address] [phone]       Velora Heckler, MD,  [facility] Office: [phone]   Signed: Velora Heckler 10/21/2012, 11:48 AM

## 2012-10-26 ENCOUNTER — Other Ambulatory Visit: Payer: Self-pay | Admitting: Gastroenterology

## 2012-10-26 DIAGNOSIS — R1310 Dysphagia, unspecified: Secondary | ICD-10-CM

## 2012-10-27 ENCOUNTER — Ambulatory Visit
Admission: RE | Admit: 2012-10-27 | Discharge: 2012-10-27 | Disposition: A | Discharge status: Home / Self Care | Payer: Medicare Other | Source: Ambulatory Visit | Attending: Gastroenterology | Admitting: Gastroenterology

## 2012-10-27 DIAGNOSIS — E538 Deficiency of other specified B group vitamins: Secondary | ICD-10-CM | POA: Diagnosis not present

## 2012-10-27 DIAGNOSIS — R1310 Dysphagia, unspecified: Secondary | ICD-10-CM

## 2012-10-27 DIAGNOSIS — K219 Gastro-esophageal reflux disease without esophagitis: Secondary | ICD-10-CM | POA: Diagnosis not present

## 2012-10-28 DIAGNOSIS — N323 Diverticulum of bladder: Secondary | ICD-10-CM | POA: Diagnosis not present

## 2012-10-28 DIAGNOSIS — R82998 Other abnormal findings in urine: Secondary | ICD-10-CM | POA: Diagnosis not present

## 2012-10-28 DIAGNOSIS — R3129 Other microscopic hematuria: Secondary | ICD-10-CM | POA: Diagnosis not present

## 2012-10-28 DIAGNOSIS — D414 Neoplasm of uncertain behavior of bladder: Secondary | ICD-10-CM | POA: Diagnosis not present

## 2012-10-29 ENCOUNTER — Other Ambulatory Visit: Payer: Medicare Other

## 2012-10-29 ENCOUNTER — Other Ambulatory Visit: Payer: Self-pay | Admitting: Urology

## 2012-11-03 ENCOUNTER — Encounter (INDEPENDENT_AMBULATORY_CARE_PROVIDER_SITE_OTHER): Payer: Self-pay | Admitting: Surgery

## 2012-11-03 ENCOUNTER — Ambulatory Visit (INDEPENDENT_AMBULATORY_CARE_PROVIDER_SITE_OTHER): Payer: Medicare Other | Admitting: Surgery

## 2012-11-03 VITALS — BP 128/72 | HR 59 | Temp 98.9°F | Resp 18 | Ht 67.0 in | Wt 160.6 lb

## 2012-11-03 DIAGNOSIS — K8010 Calculus of gallbladder with chronic cholecystitis without obstruction: Secondary | ICD-10-CM

## 2012-11-03 NOTE — Patient Instructions (Signed)
  COCOA BUTTER & VITAMIN E CREAM  (Palmer's or other brand)  Apply cocoa butter/vitamin E cream to your incision 2 - 3 times daily.  Massage cream into incision for one minute with each application.  Use sunscreen (50 SPF or higher) for first 6 months after surgery if area is exposed to sun.  You may substitute Mederma or other scar reducing creams as desired.   

## 2012-11-03 NOTE — Progress Notes (Signed)
General Surgery [facility]  Visit Diagnoses: 1. Cholelithiasis with cholecystitis     HISTORY: The patient returns for first postoperative visit having undergone laparoscopic cholecystectomy with intraoperative cholangiography 2 weeks ago. Her postoperative recovery has been uneventful. He continues to have symptoms of gastroesophageal reflux and will see his gastroenterologist about this tomorrow. Final pathology showed chronic cholecystitis and cholelithiasis.  EXAM: Abdomen is soft non-tender without distention. Surgical wounds are healing nicely. No sign of infection. No sign of herniation. Right upper quadrant is soft and non-tender.  IMPRESSION: Status post laparoscopic cholecystectomy with intraoperative cholangiography  PLAN: The patient is released to full activity without restriction. He will begin applying topical creams on his incisions. He will return as needed.  Velora Heckler, MD, FACS General & Endocrine Surgery [facility]

## 2012-11-04 ENCOUNTER — Other Ambulatory Visit: Payer: Self-pay | Admitting: Gastroenterology

## 2012-11-04 ENCOUNTER — Other Ambulatory Visit (HOSPITAL_COMMUNITY)
Admission: RE | Admit: 2012-11-04 | Discharge: 2012-11-04 | Disposition: A | Discharge status: Home / Self Care | Payer: Medicare Other | Source: Ambulatory Visit | Attending: Gastroenterology | Admitting: Gastroenterology

## 2012-11-04 DIAGNOSIS — K2940 Chronic atrophic gastritis without bleeding: Secondary | ICD-10-CM | POA: Diagnosis not present

## 2012-11-04 DIAGNOSIS — R1310 Dysphagia, unspecified: Secondary | ICD-10-CM | POA: Diagnosis not present

## 2012-11-04 DIAGNOSIS — N401 Enlarged prostate with lower urinary tract symptoms: Secondary | ICD-10-CM | POA: Diagnosis not present

## 2012-11-04 DIAGNOSIS — B3781 Candidal esophagitis: Secondary | ICD-10-CM | POA: Diagnosis not present

## 2012-11-04 DIAGNOSIS — N323 Diverticulum of bladder: Secondary | ICD-10-CM | POA: Diagnosis not present

## 2012-11-04 DIAGNOSIS — N281 Cyst of kidney, acquired: Secondary | ICD-10-CM | POA: Diagnosis not present

## 2012-11-04 DIAGNOSIS — K219 Gastro-esophageal reflux disease without esophagitis: Secondary | ICD-10-CM | POA: Diagnosis not present

## 2012-11-04 DIAGNOSIS — D414 Neoplasm of uncertain behavior of bladder: Secondary | ICD-10-CM | POA: Diagnosis not present

## 2012-11-10 ENCOUNTER — Encounter (HOSPITAL_BASED_OUTPATIENT_CLINIC_OR_DEPARTMENT_OTHER): Payer: Self-pay | Admitting: *Deleted

## 2012-11-10 NOTE — Progress Notes (Signed)
Npo after mn. Arrives at 0615. Needs hg. Will take prilosec am of surg w/ sip of water.

## 2012-11-11 ENCOUNTER — Encounter (HOSPITAL_BASED_OUTPATIENT_CLINIC_OR_DEPARTMENT_OTHER): Payer: Self-pay | Admitting: *Deleted

## 2012-11-15 NOTE — H&P (Signed)
History of Present Illness        F/u abnormal urinalysis referred by Dr. Thea Silversmith March 2014. Patient was seen for routine care January 2014 one urinalysis was noted to contain 30-50 white cells. Culture was negative. Repeat urinalysis showed 3-5 white cells and 3-5 red cells per high-powered field. He had no dysuria or gross hematuria. He was not treated with abx for the bladder. He did finish an abx recently for sinuses.   He has nocturia x 2-3 for many years. He has an adequate stream and occasionally doesnt feel empty. He has frequency and urgency. He drinks a lot of tea.   He has a h/o of BPH and is on tamsulosin. He has no stones or GU surgery. No smoking history.   Labs January 2014: PSA 1.55, TSH 2.7, BUN 20, creatinine 1.4, LFTs and alkaline phosphatase normal apart from elevated total bilirubin    Interval Hx He had his gallbladder removed recently.    Past Medical History Problems  1. History of  Esophageal Reflux 530.81 2. History of  Hypercholesterolemia 272.0 3. History of  Vitamin B12 Deficiency 266.2  Surgical History Problems  1. History of  Knee Surgery Left 2. History of  Tonsillectomy  Current Meds 1. Aspirin 81 MG Oral Tablet; Therapy: (Recorded:04Mar2014) to 2. Fish Oil CAPS; Therapy: (Recorded:04Mar2014) to 3. Niacin ER (Antihyperlipidemic) 500 MG Oral Tablet Extended Release; Therapy: 19Aug2013 to 4. Omeprazole 40 MG Oral Capsule Delayed Release; Therapy: (Recorded:04Mar2014) to 5. Tamsulosin HCl 0.4 MG Oral Capsule; Therapy: (Recorded:04Mar2014) to 6. Vitamin B-12 SOLN; Therapy: (Recorded:04Mar2014) to  Allergies Medication  1. Ketoconazole CREA  Family History Problems  1. Paternal history of  Alzheimer's Disease 2. Maternal history of  Colon Cancer V16.0 3. Family history of  Death In The Family Father 4. Family history of  Death In The Family Mother 5. Family history of  Family Health Status Number Of Children 1 son 1 daughter  Social  History Problems  1. Alcohol Use 2. Caffeine Use 3-4 per day 3. Marital History - Currently Married 4. Never A Smoker 5. Occupation: Airline pilot  Physical Exam Constitutional: Well nourished and well developed . No acute distress.  Pulmonary: No respiratory distress and normal respiratory rhythm and effort.  Cardiovascular: Heart rate and rhythm are normal . No peripheral edema.  Neuro/Psych:. Mood and affect are appropriate.    Results/Data Urine [Data Includes: Last 1 Day]   20Mar2014  COLOR AMBER   APPEARANCE CLEAR   SPECIFIC GRAVITY 1.025   pH 6.0   GLUCOSE NEG mg/dL  BILIRUBIN NEG   KETONE NEG mg/dL  BLOOD NEG   PROTEIN NEG mg/dL  UROBILINOGEN 1 mg/dL  NITRITE NEG   LEUKOCYTE ESTERASE NEG    Procedure  Procedure: Cystoscopy   Indication: Hematuria.  Informed Consent: Risks, benefits, and potential adverse events were discussed and informed consent was obtained from the patient.  Prep: The patient was prepped with betadine.  Procedure Note:  Urethral meatus:. No abnormalities.  Anterior urethra: No abnormalities.  Prostatic urethra: No abnormalities . There was visual obstruction of the prostatic urethra. The lateral prostatic lobes were enlarged.  Bladder: Visulization was clear. The ureteral orifices were in the normal anatomic position bilaterally and had clear efflux of urine. A systematic survey of the bladder demonstrated no bladder tumors or stones. The mucosa was smooth without abnormalities. Examination of the bladder demonstrated mild trabeculation and a diverticulum located on the posterior aspect, on the right side of the bladder.  The interior  of the diverticulum had quite a bit of erythema, possibly a raised area. The patient tolerated the procedure well.  He had a rather large bladder capacity and had urge incontinence after the cystoscopy.  Complications: None.    Assessment Assessed  1. Pyuria 791.9 2. Microscopic Hematuria 599.72 3. 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 AU CT-HEMATURIA PROTOCOL  Requested for: 20Mar2014 2. Follow-up Schedule Surgery Office  Follow-up  Requested for: 20Mar2014 Health Maintenance (V70.0)  3. UA With REFLEX  Done: 20Mar2014 11:09AM Microscopic Hematuria (599.72)  4. URINE CYTOLOGY  Requested for: 20Mar2014  Discussion/Summary        I discussed the cystoscopic findings with the patient I would recommend we changed the CT to a CT hematuria protocol with delayed imaging and proceed to the OR for cystoscopy bladder biopsy and fulguration. We discussed the nature risks benefits and alternatives to these evaluations. All questions answered. he elects to proceed. urine sent for Cytology.   PSA and BMP sent with pre-op labs.       Signatures Electronically signed by : Jerilee Field, M.D.; Oct 28 2012  1:05PM  Cytology negative PSA 1.55 CT scan with IV contrast showed 2 right, posterior bladder diverticulum and no mass.right renal cyst. No worrisome findings.

## 2012-11-16 ENCOUNTER — Ambulatory Visit (HOSPITAL_BASED_OUTPATIENT_CLINIC_OR_DEPARTMENT_OTHER)
Admission: RE | Admit: 2012-11-16 | Discharge: 2012-11-16 | Disposition: A | Discharge status: Home / Self Care | Payer: Medicare Other | Source: Ambulatory Visit | Attending: Urology | Admitting: Urology

## 2012-11-16 ENCOUNTER — Encounter (HOSPITAL_BASED_OUTPATIENT_CLINIC_OR_DEPARTMENT_OTHER)
Admission: RE | Disposition: A | Discharge status: Home / Self Care | Payer: Self-pay | Source: Ambulatory Visit | Attending: Urology

## 2012-11-16 ENCOUNTER — Encounter (HOSPITAL_BASED_OUTPATIENT_CLINIC_OR_DEPARTMENT_OTHER): Payer: Self-pay | Admitting: Anesthesiology

## 2012-11-16 ENCOUNTER — Ambulatory Visit (HOSPITAL_BASED_OUTPATIENT_CLINIC_OR_DEPARTMENT_OTHER): Payer: Medicare Other | Admitting: Anesthesiology

## 2012-11-16 DIAGNOSIS — D494 Neoplasm of unspecified behavior of bladder: Secondary | ICD-10-CM | POA: Insufficient documentation

## 2012-11-16 DIAGNOSIS — N3080 Other cystitis without hematuria: Secondary | ICD-10-CM | POA: Diagnosis not present

## 2012-11-16 DIAGNOSIS — N323 Diverticulum of bladder: Secondary | ICD-10-CM | POA: Diagnosis not present

## 2012-11-16 DIAGNOSIS — K219 Gastro-esophageal reflux disease without esophagitis: Secondary | ICD-10-CM | POA: Insufficient documentation

## 2012-11-16 DIAGNOSIS — N3289 Other specified disorders of bladder: Secondary | ICD-10-CM | POA: Insufficient documentation

## 2012-11-16 DIAGNOSIS — E538 Deficiency of other specified B group vitamins: Secondary | ICD-10-CM | POA: Insufficient documentation

## 2012-11-16 DIAGNOSIS — R351 Nocturia: Secondary | ICD-10-CM | POA: Diagnosis not present

## 2012-11-16 DIAGNOSIS — E7800 Pure hypercholesterolemia, unspecified: Secondary | ICD-10-CM | POA: Diagnosis not present

## 2012-11-16 DIAGNOSIS — Z79899 Other long term (current) drug therapy: Secondary | ICD-10-CM | POA: Diagnosis not present

## 2012-11-16 DIAGNOSIS — R3129 Other microscopic hematuria: Secondary | ICD-10-CM | POA: Diagnosis not present

## 2012-11-16 DIAGNOSIS — N400 Enlarged prostate without lower urinary tract symptoms: Secondary | ICD-10-CM | POA: Insufficient documentation

## 2012-11-16 HISTORY — DX: Deficiency of other specified B group vitamins: E53.8

## 2012-11-16 HISTORY — DX: Personal history of other diseases of the digestive system: Z87.19

## 2012-11-16 HISTORY — DX: Gilbert syndrome: E80.4

## 2012-11-16 HISTORY — DX: Urgency of urination: R39.15

## 2012-11-16 HISTORY — PX: CYSTOSCOPY WITH BIOPSY: SHX5122

## 2012-11-16 HISTORY — DX: Nocturia: R35.1

## 2012-11-16 HISTORY — DX: Other microscopic hematuria: R31.29

## 2012-11-16 HISTORY — DX: Neoplasm of unspecified behavior of bladder: D49.4

## 2012-11-16 HISTORY — DX: Other dysphagia: R13.19

## 2012-11-16 LAB — POCT HEMOGLOBIN-HEMACUE: Hemoglobin: 15.6 g/dL (ref 13.0–17.0)

## 2012-11-16 SURGERY — CYSTOSCOPY, WITH BIOPSY
Anesthesia: General | Site: Bladder | Wound class: Clean Contaminated

## 2012-11-16 MED ORDER — CEFAZOLIN SODIUM 1-5 GM-% IV SOLN
1.0000 g | INTRAVENOUS | Status: DC
  Filled 2012-11-16: qty 50

## 2012-11-16 MED ORDER — KETOROLAC TROMETHAMINE 30 MG/ML IJ SOLN
INTRAMUSCULAR | Status: DC | PRN
  Administered 2012-11-16: 30 mg via INTRAVENOUS

## 2012-11-16 MED ORDER — FENTANYL CITRATE 0.05 MG/ML IJ SOLN
INTRAMUSCULAR | Status: DC | PRN
  Administered 2012-11-16 (×4): 25 ug via INTRAVENOUS

## 2012-11-16 MED ORDER — PROMETHAZINE HCL 25 MG/ML IJ SOLN
6.2500 mg | INTRAMUSCULAR | Status: DC | PRN
  Filled 2012-11-16: qty 1

## 2012-11-16 MED ORDER — OXYCODONE-ACETAMINOPHEN 5-325 MG PO TABS
1.0000 | ORAL_TABLET | ORAL | Status: DC | PRN
Start: 2012-11-16 — End: 2013-01-17

## 2012-11-16 MED ORDER — CEFAZOLIN SODIUM-DEXTROSE 2-3 GM-% IV SOLR
2.0000 g | INTRAVENOUS | Status: DC
  Filled 2012-11-16: qty 50

## 2012-11-16 MED ORDER — OXYCODONE HCL 5 MG/5ML PO SOLN
5.0000 mg | Freq: Once | ORAL | Status: DC | PRN
  Filled 2012-11-16: qty 5

## 2012-11-16 MED ORDER — LIDOCAINE HCL (CARDIAC) 20 MG/ML IV SOLN
INTRAVENOUS | Status: DC | PRN
  Administered 2012-11-16: 60 mg via INTRAVENOUS

## 2012-11-16 MED ORDER — ONDANSETRON HCL 4 MG/2ML IJ SOLN
INTRAMUSCULAR | Status: DC | PRN
  Administered 2012-11-16: 4 mg via INTRAVENOUS

## 2012-11-16 MED ORDER — HYDROMORPHONE HCL PF 1 MG/ML IJ SOLN
0.2500 mg | INTRAMUSCULAR | Status: DC | PRN
  Filled 2012-11-16: qty 1

## 2012-11-16 MED ORDER — MEPERIDINE HCL 25 MG/ML IJ SOLN
6.2500 mg | INTRAMUSCULAR | Status: DC | PRN
  Filled 2012-11-16: qty 1

## 2012-11-16 MED ORDER — DEXAMETHASONE SODIUM PHOSPHATE 4 MG/ML IJ SOLN
INTRAMUSCULAR | Status: DC | PRN
  Administered 2012-11-16: 10 mg via INTRAVENOUS

## 2012-11-16 MED ORDER — OXYCODONE HCL 5 MG PO TABS
5.0000 mg | ORAL_TABLET | Freq: Once | ORAL | Status: DC | PRN
  Filled 2012-11-16: qty 1

## 2012-11-16 MED ORDER — LACTATED RINGERS IV SOLN
INTRAVENOUS | Status: DC
  Filled 2012-11-16: qty 1000

## 2012-11-16 MED ORDER — LACTATED RINGERS IV SOLN
INTRAVENOUS | Status: DC | PRN
  Administered 2012-11-16 (×2): via INTRAVENOUS

## 2012-11-16 MED ORDER — ACETAMINOPHEN 10 MG/ML IV SOLN
1000.0000 mg | Freq: Once | INTRAVENOUS | Status: DC | PRN
  Filled 2012-11-16: qty 100

## 2012-11-16 MED ORDER — PROPOFOL 10 MG/ML IV BOLUS
INTRAVENOUS | Status: DC | PRN
  Administered 2012-11-16: 160 mg via INTRAVENOUS

## 2012-11-16 MED ORDER — OXYCODONE-ACETAMINOPHEN 5-325 MG PO TABS
1.0000 | ORAL_TABLET | Freq: Four times a day (QID) | ORAL | Status: DC | PRN
  Administered 2012-11-16: 1 via ORAL
  Filled 2012-11-16: qty 2

## 2012-11-16 MED ORDER — STERILE WATER FOR IRRIGATION IR SOLN
Status: DC | PRN
  Administered 2012-11-16: 3000 mL

## 2012-11-16 MED ORDER — URIBEL 118 MG PO CAPS: 1.0000 | ORAL_CAPSULE | Freq: Four times a day (QID) | ORAL | Status: DC | PRN

## 2012-11-16 MED ORDER — CEFAZOLIN SODIUM-DEXTROSE 2-3 GM-% IV SOLR
INTRAVENOUS | Status: DC | PRN
  Administered 2012-11-16: 2 g via INTRAVENOUS

## 2012-11-16 MED ORDER — TAMSULOSIN HCL 0.4 MG PO CAPS
0.4000 mg | ORAL_CAPSULE | Freq: Every day | ORAL | Status: DC
  Administered 2012-11-16: 0.4 mg via ORAL
  Filled 2012-11-16: qty 1

## 2012-11-16 SURGICAL SUPPLY — 15 items
BAG DRAIN URO-CYSTO SKYTR STRL (DRAIN) ×2 IMPLANT
BAG DRN UROCATH (DRAIN) ×1
CANISTER SUCT LVC 12 LTR MEDI- (MISCELLANEOUS) ×1 IMPLANT
CLOTH BEACON ORANGE TIMEOUT ST (SAFETY) ×2 IMPLANT
DRAPE CAMERA CLOSED 9X96 (DRAPES) ×2 IMPLANT
ELECT REM PT RETURN 9FT ADLT (ELECTROSURGICAL) ×2
ELECTRODE REM PT RTRN 9FT ADLT (ELECTROSURGICAL) ×1 IMPLANT
GLOVE BIO SURGEON STRL SZ7.5 (GLOVE) ×2 IMPLANT
GLOVE SKINSENSE NS SZ7.0 (GLOVE) ×3
GLOVE SKINSENSE STRL SZ7.0 (GLOVE) IMPLANT
GOWN STRL REIN XL XLG (GOWN DISPOSABLE) ×3 IMPLANT
NEEDLE HYPO 22GX1.5 SAFETY (NEEDLE) IMPLANT
NS IRRIG 500ML POUR BTL (IV SOLUTION) IMPLANT
PACK CYSTOSCOPY (CUSTOM PROCEDURE TRAY) ×2 IMPLANT
WATER STERILE IRR 3000ML UROMA (IV SOLUTION) ×2 IMPLANT

## 2012-11-16 NOTE — Anesthesia Preprocedure Evaluation (Signed)
Anesthesia Evaluation  Patient identified by MRN, date of birth, ID band Patient awake    Reviewed: Allergy & Precautions, H&P , NPO status , Patient's Chart, lab work & pertinent test results, reviewed documented beta blocker date and time   Airway Mallampati: II TM Distance: >3 FB Neck ROM: full    Dental no notable dental hx. (+) Dental Advisory Given   Pulmonary neg pulmonary ROS,  breath sounds clear to auscultation  Pulmonary exam normal       Cardiovascular Exercise Tolerance: Good negative cardio ROS  Rhythm:regular Rate:Normal     Neuro/Psych negative neurological ROS  negative psych ROS   GI/Hepatic negative GI ROS, Neg liver ROS, GERD-  Medicated,  Endo/Other  negative endocrine ROS  Renal/GU Renal diseaseBright's disease.     Musculoskeletal   Abdominal   Peds  Hematology negative hematology ROS (+)   Anesthesia Other Findings   Reproductive/Obstetrics negative OB ROS                           Anesthesia Physical  Anesthesia Plan  ASA: II  Anesthesia Plan: General   Post-op Pain Management:    Induction: Intravenous  Airway Management Planned: LMA  Additional Equipment:   Intra-op Plan:   Post-operative Plan: Extubation in OR  Informed Consent: I have reviewed the patients History and Physical, chart, labs and discussed the procedure including the risks, benefits and alternatives for the proposed anesthesia with the patient or authorized representative who has indicated his/her understanding and acceptance.   Dental Advisory Given  Plan Discussed with: CRNA  Anesthesia Plan Comments:         Anesthesia Quick Evaluation

## 2012-11-16 NOTE — Anesthesia Procedure Notes (Signed)
Procedure Name: LMA Insertion Date/Time: 11/16/2012 7:48 AM Performed by: Jessica Priest Pre-anesthesia Checklist: Patient identified, Emergency Drugs available, Suction available and Patient being monitored Patient Re-evaluated:Patient Re-evaluated prior to inductionOxygen Delivery Method: Circle System Utilized Preoxygenation: Pre-oxygenation with 100% oxygen Intubation Type: IV induction Ventilation: Mask ventilation without difficulty LMA: LMA inserted LMA Size: 4.0 Number of attempts: 1 Airway Equipment and Method: bite block Placement Confirmation: positive ETCO2 Tube secured with: Tape Dental Injury: Teeth and Oropharynx as per pre-operative assessment

## 2012-11-16 NOTE — Progress Notes (Signed)
Dr. Mena Goes informed of bp 178/78 hr 55.  Pain 6/10.  No pain med ordered.  X uribel for d/c.  Script obtained for roxicet.  Pt and wife informed.

## 2012-11-16 NOTE — Op Note (Signed)
Preoperative diagnosis: Bladder diverticulum, bladder erythema/neoplasm Postoperative diagnosis: Same  Procedure: Exam under anesthesia Cystoscopy bladder biopsy and fulguration  Surgeon: Milford Cilento  Type of anesthesia: Gen.  Findings: On exam under anesthesia the penis was circumcised and normal without mass or lesion. Testicles were descended bilaterally and had no hard area or mass. On digital rectal exam the prostate at some mild BPH but was smooth without hard area or nodule.  Cystoscopy - the anterior urethra was normal. The prostatic urethra showed some mild BPH and an elevated bladder neck. The trigone and ureteral orifice these were in their normal orthotopic position and there was good clear eflux of urine.  Exam of the bladder mucosa was normal apart from the mucosa of 2 right posterior diverticula that exhibited some flat erythema. There were no papillary tumors. There were no stones or foreign bodies.  Description of procedure: After consent was obtained patient brought to the operating room. After adequate anesthesia he was placed in lithotomy position and prepped and draped in the usual sterile fashion. A timeout was performed to confirm the patient and procedure. Using sterile gloves and exam under anesthesia was performed. Gloves were changed and cystoscope was passed per urethra and the bladder examined with a 12 and 70 lens in its entirety. The 2 posterior diverticula are approximately 2 cm across in each. The mucosa of each diverticula was biopsied x2 and and fulgurated. There was good hemostasis at low-pressure. There was no evidence of any perforation.  The bladder was drained and the scope removed. The patient was awakened taken to recovery room in stable condition.  Specimens: #1 right bladder superior diverticulum biopsy #2 right bladder inferior diverticulum biopsy Specimens sent to pathology.  Complications: None  Blood loss: Minimal  Drains: None

## 2012-11-16 NOTE — Anesthesia Postprocedure Evaluation (Signed)
Anesthesia Post Note  Patient: Andrew Osborne  Procedure(s) Performed: Procedure(s) (LRB): CYSTOSCOPY WITH BLADDER BIOPSY AND FULGERATION (N/A)  Anesthesia type: General  Patient location: PACU  Post pain: Pain level controlled  Post assessment: Post-op Vital signs reviewed  Last Vitals: BP 123/86  Pulse 71  Temp(Src) 36.5 C (Oral)  Resp 13  Ht 5\' 7"  (1.702 m)  Wt 165 lb (74.844 kg)  BMI 25.84 kg/m2  SpO2 99%  Post vital signs: Reviewed  Level of consciousness: sedated  Complications: No apparent anesthesia complications

## 2012-11-16 NOTE — Interval H&P Note (Signed)
History and Physical Interval Note:  11/16/2012 7:34 AM  Andrew Osborne  has presented today for surgery, with the diagnosis of BLADDER NEOPLASM AND MICRO HEMATURIA  The various methods of treatment have been discussed with the patient and family. After consideration of risks, benefits and other options for treatment, the patient has consented to  Procedure(s): CYSTOSCOPY WITH BLADDER BIOPSY AND FULGERATION (N/A) as a surgical intervention .  The patient's history has been reviewed, patient examined, no change in status, stable for surgery.  I have reviewed the patient's chart and labs. He is here with his wife and daughter. They tell me his mother-in-law recently died from bladder cancer and naturally they have a lot of questions. We discussed [name] has no obvious tumor but erythema and we want to make sure is not something like CIS. We discussed potential benefits, risks and alternatives to bladder biopsy, including side effects of the proposed treatment, the likelihood of the patient achieving the goals of the procedure, and any potential problems that might occur during the procedure or recuperation. We discussed the thin-walled nature of bladder tics and risk of bladder perf among others. Questions were answered to the patient's satisfaction.  Pt elects to proceed.    Antony Haste

## 2012-11-16 NOTE — Transfer of Care (Signed)
Immediate Anesthesia Transfer of Care Note  Patient: Andrew Osborne  Procedure(s) Performed: Procedure(s) (LRB): CYSTOSCOPY WITH BLADDER BIOPSY AND FULGERATION (N/A)  Patient Location: PACU  Anesthesia Type: General  Level of Consciousness: awake, sedated, patient cooperative and responds to stimulation  Airway & Oxygen Therapy: Patient Spontanous Breathing and Patient connected to face mask oxygen  Post-op Assessment: Report given to PACU RN, Post -op Vital signs reviewed and stable and Patient moving all extremities  Post vital signs: Reviewed and stable  Complications: No apparent anesthesia complications

## 2012-11-17 ENCOUNTER — Encounter (HOSPITAL_BASED_OUTPATIENT_CLINIC_OR_DEPARTMENT_OTHER): Payer: Self-pay | Admitting: Urology

## 2012-11-22 DIAGNOSIS — E538 Deficiency of other specified B group vitamins: Secondary | ICD-10-CM | POA: Diagnosis not present

## 2012-12-15 DIAGNOSIS — E538 Deficiency of other specified B group vitamins: Secondary | ICD-10-CM | POA: Diagnosis not present

## 2012-12-22 DIAGNOSIS — R634 Abnormal weight loss: Secondary | ICD-10-CM | POA: Diagnosis not present

## 2012-12-22 DIAGNOSIS — R1310 Dysphagia, unspecified: Secondary | ICD-10-CM | POA: Diagnosis not present

## 2012-12-22 DIAGNOSIS — B3781 Candidal esophagitis: Secondary | ICD-10-CM | POA: Diagnosis not present

## 2013-01-10 ENCOUNTER — Ambulatory Visit: Payer: Medicare Other | Admitting: Internal Medicine

## 2013-01-10 ENCOUNTER — Encounter: Payer: Self-pay | Admitting: Internal Medicine

## 2013-01-11 ENCOUNTER — Encounter: Payer: Self-pay | Admitting: Internal Medicine

## 2013-01-11 ENCOUNTER — Ambulatory Visit (INDEPENDENT_AMBULATORY_CARE_PROVIDER_SITE_OTHER): Payer: Medicare Other | Admitting: Internal Medicine

## 2013-01-11 VITALS — BP 132/83 | HR 64 | Temp 98.0°F | Wt 164.0 lb

## 2013-01-11 DIAGNOSIS — K219 Gastro-esophageal reflux disease without esophagitis: Secondary | ICD-10-CM | POA: Insufficient documentation

## 2013-01-11 DIAGNOSIS — R634 Abnormal weight loss: Secondary | ICD-10-CM | POA: Diagnosis not present

## 2013-01-11 DIAGNOSIS — E785 Hyperlipidemia, unspecified: Secondary | ICD-10-CM | POA: Diagnosis not present

## 2013-01-11 DIAGNOSIS — R319 Hematuria, unspecified: Secondary | ICD-10-CM | POA: Insufficient documentation

## 2013-01-11 NOTE — Progress Notes (Signed)
Patient ID: Andrew Osborne, male   DOB: 1941-06-24, 72 y.o.   MRN: [id number]         [facility] for Infectious Disease  Reason for Consult: Possible recurrent esophageal candidiasis Referring Physician: Dr. Leary Roca  Patient Active Problem List   Diagnosis Date Noted  . Acid reflux 01/11/2013  . Dyslipidemia 01/11/2013  . Hematuria 01/11/2013  . Weight loss, unintentional 01/11/2013  . Cholelithiasis with cholecystitis 09/21/2012    Patient's Medications  New Prescriptions   No medications on file  Previous Medications   ASPIRIN 81 MG TABLET    Take 81 mg by mouth daily.   CYANOCOBALAMIN (VITAMIN B-12 IJ)    Inject as directed every 21 ( twenty-one) days.   METH-HYO-M BL-NA PHOS-PH SAL (URIBEL) 118 MG CAPS    Take 1 capsule (118 mg total) by mouth every 6 (six) hours as needed.   NAPROXEN SODIUM (ANAPROX) 220 MG TABLET    Take 220 mg by mouth as needed (pain).   NIACIN 500 MG TABLET    Take 500 mg by mouth daily with breakfast.   OMEGA-3 FATTY ACIDS (FISH OIL PO)    Take 2 capsules by mouth 2 (two) times daily.   OMEPRAZOLE (PRILOSEC) 40 MG CAPSULE    Take 40 mg by mouth 2 (two) times daily.    OXYCODONE-ACETAMINOPHEN (ROXICET) 5-325 MG PER TABLET    Take 1 tablet by mouth every 4 (four) hours as needed for pain.   PROPYLENE GLYCOL (SYSTANE BALANCE) 0.6 % SOLN    Place 1 drop into both eyes 2 (two) times daily.    TAMSULOSIN HCL (FLOMAX) 0.4 MG CAPS    Take 0.8 mg by mouth daily after supper.   Modified Medications   No medications on file  Discontinued Medications   No medications on file    Recommendations: 1. Continue nystatin for now 2. Obtain more complete records from Dr. Ewing Schlein and Dr. Shary Decamp  3. Followup in 2 weeks  Assessment: He does not have any known risk factors for her persistent or recurrent esophageal candidiasis. His HIV antibody studies are negative recently and he's not on any immunosuppressive therapies. Before making a final  recommendation I will need to get complete records of his endoscopy findings and complete copies of exam and blood work done by Dr. Ronne Binning, his primary care physician.  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 Ewing Schlein for evaluation of possible esophageal candidiasis. Mr. Andrew Osborne states that he cannot remember many details about his history but thinks that he had developed some difficulty swallowing 3-4 years ago. He has undergone 3 upper endoscopies and 2 dilations of esophageal strictures. He was found to have some evidence of esophageal candidiasis although I do not have any culture results for endoscopy reports. About 12 years ago he had some short of diffuse rash and was put on oral ketoconazole and developed what was thought to be a severe allergic reaction. Several years ago when he was found to have evidence of esophageal candidiasis he refuses to take fluconazole and was started on nystatin.  He has had persistent problems with swallowing difficulties that he has difficulty describing. It sounds like he has both component of dysphagia and odynophgia. He does not seem to notice any difference between solids and liquids. He is bothered by acid indigestion and has to watch what he eats. He says that he has thrush but when asked what  he means by that he says that he feels like he has thick secretions in his mouth that he has to spit out. He is not aware of ever seeing any white coating in his mouth. He states that he feels better if he takes nystatin swish and swallow on a daily basis. He has been losing his appetite and has lost some weight over the past year. This is not completely improved after his last endoscopy and dilatation several months ago or after having an cholecystectomy several months ago.  Review of Systems: Pertinent items are noted in HPI.    Past Medical History  Diagnosis Date  . GERD (gastroesophageal reflux disease)   .  Barrett's esophagus   . Hyperlipidemia 10-05-12    Gilbert's syndrome"-benign hereditary elevated bilirubin".  . Abnormal chest CT 10-05-12 -- last cxr clear    Epic 1'14-results noted by Dr. Okey Dupre.-  chronic aspiration secondary to gerd  . Gilbert syndrome     benign hereditary elevated bilirubin  . Bladder neoplasm   . Microhematuria   . Nocturia   . Urgency of urination   . Other dysphagia monitored by dr Ewing Schlein    chronic --  monitors eating habits and takes carafate  . H/O hiatal hernia   . B12 deficiency     History  Substance Use Topics  . Smoking status: Never Smoker   . Smokeless tobacco: Never Used  . Alcohol Use: 3.0 oz/week    5 Shots of liquor per week     Comment: x5 dks per week    Family History  Problem Relation Age of Onset  . Cancer Mother     Liver,Colon Cancer  . Alzheimer's disease Father    Allergies  Allergen Reactions  . Nizoral (Ketoconazole) Other (See Comments)    "Made fluid come through skin"    OBJECTIVE: Blood pressure 132/83, pulse 64, temperature 98 F (36.7 C), temperature source Oral, weight 164 lb (74.39 kg). General: He is well dressed and in good spirits Skin: No rash Oral: No oropharyngeal lesions. Dentures. Lymph nodes: No palpable adenopathy Lungs: Clear Cor: Regular S1 and S2 no murmurs Abdomen: Soft and nontender with no palpable masses Mood and affect normal Microbiology: No results found for this or any previous visit (from the past 240 hour(s)).  Cliffton Asters, MD [facility] for Infectious Disease [facility] [phone] pager   [phone] cell 01/11/2013, 5:06 PM

## 2013-01-12 DIAGNOSIS — E538 Deficiency of other specified B group vitamins: Secondary | ICD-10-CM | POA: Diagnosis not present

## 2013-01-14 ENCOUNTER — Telehealth: Payer: Self-pay | Admitting: Licensed Clinical Social Worker

## 2013-01-14 NOTE — Telephone Encounter (Signed)
Patient was seen by Dr. Orvan Falconer for oral yeast and prescribed nystatin. He currently has developed in the past 24 hours black sores on his legs, shoulders, back, and one on his abdomen. He denies drainage from the sites or pain. Per Dr. Luciana Axe he that this is something that could wait until early next week to see Dr. Orvan Falconer or another ID provider. I will notify the patient.

## 2013-01-17 ENCOUNTER — Ambulatory Visit (INDEPENDENT_AMBULATORY_CARE_PROVIDER_SITE_OTHER): Payer: Medicare Other | Admitting: Internal Medicine

## 2013-01-17 ENCOUNTER — Encounter: Payer: Self-pay | Admitting: Internal Medicine

## 2013-01-17 VITALS — BP 130/82 | HR 82 | Temp 97.6°F | Ht 67.0 in | Wt 164.4 lb

## 2013-01-17 DIAGNOSIS — Z8619 Personal history of other infectious and parasitic diseases: Secondary | ICD-10-CM | POA: Diagnosis not present

## 2013-01-17 DIAGNOSIS — R21 Rash and other nonspecific skin eruption: Secondary | ICD-10-CM

## 2013-01-17 DIAGNOSIS — L259 Unspecified contact dermatitis, unspecified cause: Secondary | ICD-10-CM | POA: Diagnosis not present

## 2013-01-17 NOTE — Progress Notes (Signed)
Patient ID: Andrew Osborne, male   DOB: 01/17/1941, 72 y.o.   MRN: [id number]         [facility] for Infectious Disease  Patient Active Problem List   Diagnosis Date Noted  . History of thrush 01/17/2013  . Rash and nonspecific skin eruption 01/17/2013  . Acid reflux 01/11/2013  . Dyslipidemia 01/11/2013  . Hematuria 01/11/2013  . Weight loss, unintentional 01/11/2013  . Cholelithiasis with cholecystitis 09/21/2012    Patient's Medications  New Prescriptions   No medications on file  Previous Medications   ASPIRIN 81 MG TABLET    Take 81 mg by mouth daily.   CYANOCOBALAMIN (VITAMIN B-12 IJ)    Inject as directed every 21 ( twenty-one) days.   NIACIN 500 MG TABLET    Take 500 mg by mouth daily with breakfast.   NYSTATIN (MYCOSTATIN) 100000 UNIT/ML SUSPENSION    Take 500,000 Units by mouth 4 (four) times daily.   OMEGA-3 FATTY ACIDS (FISH OIL PO)    Take 2 capsules by mouth 2 (two) times daily.   OMEPRAZOLE (PRILOSEC) 40 MG CAPSULE    Take 40 mg by mouth 2 (two) times daily.    PROPYLENE GLYCOL (SYSTANE BALANCE) 0.6 % SOLN    Place 1 drop into both eyes 2 (two) times daily.    TAMSULOSIN HCL (FLOMAX) 0.4 MG CAPS    Take 0.8 mg by mouth daily after supper.   Modified Medications   No medications on file  Discontinued Medications   METH-HYO-M BL-NA PHOS-PH SAL (URIBEL) 118 MG CAPS    Take 1 capsule (118 mg total) by mouth every 6 (six) hours as needed.   NAPROXEN SODIUM (ANAPROX) 220 MG TABLET    Take 220 mg by mouth as needed (pain).   OXYCODONE-ACETAMINOPHEN (ROXICET) 5-325 MG PER TABLET    Take 1 tablet by mouth every 4 (four) hours as needed for pain.    Subjective: Andrew Osborne is seen on a work in basis. He called late last week stating that he had suddenly developed a rash and was worried. He says that he noticed about 5 bad sores all of a sudden on Thursday, June 5. He says that he noticed all 5 at the same time. There was one each on his posterior calves and  several on his upper back. He says that the lesions look like "raw skin". The lesions were not painful and did not itch. He recalls that he did play tennis and golf several times last week and was outside a great deal. He is not sure he had any insect bites. Other than noting the spots he did not feel bad in any other way. He has not had any fever, chills or sweats. He says that he still notices the thick white exudate when he rinses his mouth.  Review of Systems: Pertinent items are noted in HPI.  Past Medical History  Diagnosis Date  . GERD (gastroesophageal reflux disease)   . Barrett's esophagus   . Hyperlipidemia 10-05-12    Gilbert's syndrome"-benign hereditary elevated bilirubin".  . Abnormal chest CT 10-05-12 -- last cxr clear    Epic 1'14-results noted by Dr. Okey Dupre.-  chronic aspiration secondary to gerd  . Gilbert syndrome     benign hereditary elevated bilirubin  . Bladder neoplasm   . Microhematuria   . Nocturia   . Urgency of urination   . Other dysphagia monitored by dr Ewing Schlein    chronic --  monitors eating habits and takes  carafate  . H/O hiatal hernia   . B12 deficiency     History  Substance Use Topics  . Smoking status: Never Smoker   . Smokeless tobacco: Never Used  . Alcohol Use: 3.0 oz/week    5 Shots of liquor per week     Comment: x5 dks per week    Family History  Problem Relation Age of Onset  . Cancer Mother     Liver,Colon Cancer  . Alzheimer's disease Father     Allergies  Allergen Reactions  . Nizoral (Ketoconazole) Other (See Comments)    "Made fluid come through skin"    Objective: Temp: 97.6 F (36.4 C) (06/09 1558) Temp src: Oral (06/09 1558) BP: 130/82 mmHg (06/09 1558) Pulse Rate: 82 (06/09 1558)  General: He appears healthy and in good spirits Skin: He can only show me 2 of the 5 lesions. There is a small scabbed area on his left posterior calf and one small brown macule on his right posterior calf. His wife pointed out that that  lesion looked like an "age spot". There were no obvious acute lesions on his back. Oral: Thrush or other lesions. Full dentures. Lungs: Clear Cor: Regular S1 and S2 no murmurs Abdomen: Soft and nontender   Assessment: I cannot determine the cause of his recent rash but the lesions appear to be resolving quickly and are not causing any symptoms. I would not do anything differently than continued observation at this time.  When I saw him last week I requested records from Dr. Shary Decamp and additional records from Dr. Leary Roca to see if I can learn more about his history of possible recurrent/persistent esophageal candidiasis. He does not have any obvious risk factors for this and I do not see any evidence of thrush.  Plan: 1. I will call him after about a chance to review records from his other physicians   Cliffton Asters, MD [facility] for Infectious Disease [facility] [phone] pager   [phone] cell 01/17/2013, 4:38 PM

## 2013-01-25 ENCOUNTER — Ambulatory Visit: Payer: Medicare Other | Admitting: Internal Medicine

## 2013-02-16 DIAGNOSIS — E538 Deficiency of other specified B group vitamins: Secondary | ICD-10-CM | POA: Diagnosis not present

## 2013-03-30 DIAGNOSIS — R7301 Impaired fasting glucose: Secondary | ICD-10-CM | POA: Diagnosis not present

## 2013-03-30 DIAGNOSIS — E538 Deficiency of other specified B group vitamins: Secondary | ICD-10-CM | POA: Diagnosis not present

## 2013-03-30 DIAGNOSIS — E785 Hyperlipidemia, unspecified: Secondary | ICD-10-CM | POA: Diagnosis not present

## 2013-03-30 DIAGNOSIS — N183 Chronic kidney disease, stage 3 unspecified: Secondary | ICD-10-CM | POA: Diagnosis not present

## 2013-04-06 DIAGNOSIS — K219 Gastro-esophageal reflux disease without esophagitis: Secondary | ICD-10-CM | POA: Diagnosis not present

## 2013-04-06 DIAGNOSIS — N183 Chronic kidney disease, stage 3 unspecified: Secondary | ICD-10-CM | POA: Diagnosis not present

## 2013-04-06 DIAGNOSIS — E785 Hyperlipidemia, unspecified: Secondary | ICD-10-CM | POA: Diagnosis not present

## 2013-04-06 DIAGNOSIS — R7301 Impaired fasting glucose: Secondary | ICD-10-CM | POA: Diagnosis not present

## 2013-04-22 DIAGNOSIS — E538 Deficiency of other specified B group vitamins: Secondary | ICD-10-CM | POA: Diagnosis not present

## 2013-05-23 DIAGNOSIS — E538 Deficiency of other specified B group vitamins: Secondary | ICD-10-CM | POA: Diagnosis not present

## 2013-06-23 DIAGNOSIS — E538 Deficiency of other specified B group vitamins: Secondary | ICD-10-CM | POA: Diagnosis not present

## 2013-07-13 DIAGNOSIS — H04209 Unspecified epiphora, unspecified lacrimal gland: Secondary | ICD-10-CM | POA: Diagnosis not present

## 2013-07-13 DIAGNOSIS — H04129 Dry eye syndrome of unspecified lacrimal gland: Secondary | ICD-10-CM | POA: Diagnosis not present

## 2013-07-13 DIAGNOSIS — H2589 Other age-related cataract: Secondary | ICD-10-CM | POA: Diagnosis not present

## 2013-07-13 DIAGNOSIS — H02839 Dermatochalasis of unspecified eye, unspecified eyelid: Secondary | ICD-10-CM | POA: Diagnosis not present

## 2013-07-13 DIAGNOSIS — Z5189 Encounter for other specified aftercare: Secondary | ICD-10-CM | POA: Diagnosis not present

## 2013-07-13 DIAGNOSIS — H11829 Conjunctivochalasis, unspecified eye: Secondary | ICD-10-CM | POA: Diagnosis not present

## 2013-07-13 DIAGNOSIS — H0289 Other specified disorders of eyelid: Secondary | ICD-10-CM | POA: Diagnosis not present

## 2013-07-13 DIAGNOSIS — H26499 Other secondary cataract, unspecified eye: Secondary | ICD-10-CM | POA: Diagnosis not present

## 2013-07-13 DIAGNOSIS — H35379 Puckering of macula, unspecified eye: Secondary | ICD-10-CM | POA: Diagnosis not present

## 2013-07-13 DIAGNOSIS — H2640 Unspecified secondary cataract: Secondary | ICD-10-CM | POA: Diagnosis not present

## 2013-07-27 DIAGNOSIS — E538 Deficiency of other specified B group vitamins: Secondary | ICD-10-CM | POA: Diagnosis not present

## 2013-08-12 ENCOUNTER — Telehealth: Payer: Self-pay | Admitting: *Deleted

## 2013-08-12 NOTE — Telephone Encounter (Signed)
Received call from [facility] at Dr. Perley Jain office; they have been refilling his Nystatin oral solution and patient has been told he needs a follow up with Dr. Megan Salon. Last seen 01/2013, and Dr. Megan Salon was to review previous MD notes. Does patient need a follow up at this clinic as Dr. Perley Jain office no longer wants to refill Nystatin that patient is requesting? Dr. Perley Jain # [phone]. Myrtis Hopping CMA

## 2013-08-18 NOTE — Telephone Encounter (Signed)
Please call Andrew Osborne and add him onto my schedule for a followup visit next week.

## 2013-08-18 NOTE — Telephone Encounter (Signed)
Patient scheduled for 08/23/13. Andrew Osborne

## 2013-08-23 ENCOUNTER — Ambulatory Visit (INDEPENDENT_AMBULATORY_CARE_PROVIDER_SITE_OTHER): Payer: Medicare Other | Admitting: Infectious Diseases

## 2013-08-23 ENCOUNTER — Encounter: Payer: Self-pay | Admitting: Infectious Diseases

## 2013-08-23 VITALS — BP 134/80 | HR 68 | Temp 97.6°F | Ht 67.0 in | Wt 170.0 lb

## 2013-08-23 DIAGNOSIS — R1906 Epigastric swelling, mass or lump: Secondary | ICD-10-CM

## 2013-08-23 DIAGNOSIS — K5901 Slow transit constipation: Secondary | ICD-10-CM

## 2013-08-23 DIAGNOSIS — B370 Candidal stomatitis: Secondary | ICD-10-CM | POA: Insufficient documentation

## 2013-08-23 DIAGNOSIS — R739 Hyperglycemia, unspecified: Secondary | ICD-10-CM

## 2013-08-23 DIAGNOSIS — R7309 Other abnormal glucose: Secondary | ICD-10-CM | POA: Diagnosis not present

## 2013-08-23 LAB — CBC WITH DIFFERENTIAL/PLATELET
BASOS PCT: 1 % (ref 0–1)
Basophils Absolute: 0 10*3/uL (ref 0.0–0.1)
EOS ABS: 0.1 10*3/uL (ref 0.0–0.7)
EOS PCT: 2 % (ref 0–5)
HCT: 44.5 % (ref 39.0–52.0)
Hemoglobin: 15.5 g/dL (ref 13.0–17.0)
LYMPHS ABS: 1.2 10*3/uL (ref 0.7–4.0)
Lymphocytes Relative: 22 % (ref 12–46)
MCH: 31.6 pg (ref 26.0–34.0)
MCHC: 34.8 g/dL (ref 30.0–36.0)
MCV: 90.8 fL (ref 78.0–100.0)
MONOS PCT: 9 % (ref 3–12)
Monocytes Absolute: 0.5 10*3/uL (ref 0.1–1.0)
NEUTROS PCT: 66 % (ref 43–77)
Neutro Abs: 3.5 10*3/uL (ref 1.7–7.7)
PLATELETS: 177 10*3/uL (ref 150–400)
RBC: 4.9 MIL/uL (ref 4.22–5.81)
RDW: 14 % (ref 11.5–15.5)
WBC: 5.4 10*3/uL (ref 4.0–10.5)

## 2013-08-23 LAB — HEMOGLOBIN A1C
Hgb A1c MFr Bld: 5.8 % — ABNORMAL HIGH (ref <5.7)
MEAN PLASMA GLUCOSE: 120 mg/dL — AB (ref <117)

## 2013-08-23 NOTE — Progress Notes (Signed)
   Subjective:    Patient ID: Andrew Osborne, male    DOB: 1940-09-22, 73 y.o.   MRN: [id number]  HPI 73 yo M with hx of oral lesions, thrush seen on EGD 11-04-12. He has been having repeat episodes for over 1 year. Has been using nystain suspension.  Has been staying away from spicy food.  His only other chronic medical problem is increased lipids, Gilbert's and GRD.   Review of Systems  Constitutional: Negative for appetite change and unexpected weight change.  Gastrointestinal: Positive for abdominal pain. Negative for diarrhea and constipation.       Dysphagia. Feels like food/liquids get stuck in esophagus.   Genitourinary: Negative for difficulty urinating.  Hematological: Does not bruise/bleed easily.  takes stool softerner/laxative qow.      Objective:   Physical Exam  Constitutional: He appears well-developed and well-nourished.  HENT:  Mouth/Throat: No oropharyngeal exudate.  Eyes: EOM are normal. Pupils are equal, round, and reactive to light.  Cardiovascular: Normal rate, regular rhythm and normal heart sounds.   Pulmonary/Chest: Effort normal and breath sounds normal.  Abdominal: Soft. Bowel sounds are normal. He exhibits no distension. There is no tenderness.          Assessment & Plan:

## 2013-08-23 NOTE — Addendum Note (Signed)
Addended by: Dolan Amen D on: 08/23/2013 12:32 PM   Modules accepted: Orders

## 2013-08-23 NOTE — Assessment & Plan Note (Signed)
The etiology of this is unclear- would query if it could be related to his GERD? He has a negative HIV test last year. Would consider repeating this and screening him for immune deficiency syndromes (very rare). Will see him back in 2-3 weeks.

## 2013-08-24 LAB — HIV ANTIBODY (ROUTINE TESTING W REFLEX): HIV: NONREACTIVE

## 2013-08-24 LAB — IGE: IGE (IMMUNOGLOBULIN E), SERUM: 5.6 [IU]/mL (ref 0.0–180.0)

## 2013-08-24 LAB — IGG, IGA, IGM
IGM, SERUM: 29 mg/dL — AB (ref 41–251)
IgA: 20 mg/dL — ABNORMAL LOW (ref 68–379)
IgG (Immunoglobin G), Serum: 1180 mg/dL (ref 650–1600)

## 2013-08-24 LAB — T-HELPER CELL (CD4) - (RCID CLINIC ONLY)
CD4 % Helper T Cell: 71 % — ABNORMAL HIGH (ref 33–55)
CD4 T Cell Abs: 970 /uL (ref 400–2700)

## 2013-08-24 LAB — TSH: TSH: 3.274 u[IU]/mL (ref 0.350–4.500)

## 2013-08-25 LAB — COMPLEMENT, TOTAL: Compl, Total (CH50): 58 U/mL (ref 31–60)

## 2013-08-26 ENCOUNTER — Other Ambulatory Visit: Payer: Self-pay | Admitting: Infectious Diseases

## 2013-08-26 ENCOUNTER — Other Ambulatory Visit: Payer: Self-pay | Admitting: *Deleted

## 2013-08-26 DIAGNOSIS — R799 Abnormal finding of blood chemistry, unspecified: Secondary | ICD-10-CM

## 2013-08-26 DIAGNOSIS — R771 Abnormality of globulin: Secondary | ICD-10-CM

## 2013-08-26 NOTE — Progress Notes (Signed)
Sent referral attention [facility] so that it would appear in their workqueue

## 2013-08-31 ENCOUNTER — Encounter: Payer: Self-pay | Admitting: Hematology and Oncology

## 2013-08-31 ENCOUNTER — Ambulatory Visit (HOSPITAL_BASED_OUTPATIENT_CLINIC_OR_DEPARTMENT_OTHER): Payer: Medicare Other | Admitting: Hematology and Oncology

## 2013-08-31 ENCOUNTER — Telehealth: Payer: Self-pay | Admitting: Hematology and Oncology

## 2013-08-31 ENCOUNTER — Ambulatory Visit (HOSPITAL_BASED_OUTPATIENT_CLINIC_OR_DEPARTMENT_OTHER): Payer: Medicare Other

## 2013-08-31 ENCOUNTER — Ambulatory Visit: Payer: Medicare Other

## 2013-08-31 VITALS — BP 142/81 | HR 72 | Temp 97.7°F | Resp 18 | Ht 67.0 in | Wt 166.9 lb

## 2013-08-31 DIAGNOSIS — D801 Nonfamilial hypogammaglobulinemia: Secondary | ICD-10-CM | POA: Diagnosis not present

## 2013-08-31 DIAGNOSIS — E538 Deficiency of other specified B group vitamins: Secondary | ICD-10-CM

## 2013-08-31 DIAGNOSIS — B370 Candidal stomatitis: Secondary | ICD-10-CM

## 2013-08-31 DIAGNOSIS — E8809 Other disorders of plasma-protein metabolism, not elsewhere classified: Secondary | ICD-10-CM

## 2013-08-31 DIAGNOSIS — F1010 Alcohol abuse, uncomplicated: Secondary | ICD-10-CM

## 2013-08-31 HISTORY — DX: Nonfamilial hypogammaglobulinemia: D80.1

## 2013-08-31 HISTORY — DX: Other disorders of plasma-protein metabolism, not elsewhere classified: E88.09

## 2013-08-31 HISTORY — DX: Deficiency of other specified B group vitamins: E53.8

## 2013-08-31 LAB — CBC WITH DIFFERENTIAL/PLATELET
BASO%: 0.4 % (ref 0.0–2.0)
BASOS ABS: 0 10*3/uL (ref 0.0–0.1)
EOS%: 1.2 % (ref 0.0–7.0)
Eosinophils Absolute: 0.1 10*3/uL (ref 0.0–0.5)
HEMATOCRIT: 44.3 % (ref 38.4–49.9)
HEMOGLOBIN: 15.1 g/dL (ref 13.0–17.1)
LYMPH%: 12.1 % — AB (ref 14.0–49.0)
MCH: 31.7 pg (ref 27.2–33.4)
MCHC: 34.1 g/dL (ref 32.0–36.0)
MCV: 93.1 fL (ref 79.3–98.0)
MONO#: 0.6 10*3/uL (ref 0.1–0.9)
MONO%: 7 % (ref 0.0–14.0)
NEUT#: 6.5 10*3/uL (ref 1.5–6.5)
NEUT%: 79.3 % — ABNORMAL HIGH (ref 39.0–75.0)
Platelets: 143 10*3/uL (ref 140–400)
RBC: 4.76 10*6/uL (ref 4.20–5.82)
RDW: 13.7 % (ref 11.0–14.6)
WBC: 8.2 10*3/uL (ref 4.0–10.3)
lymph#: 1 10*3/uL (ref 0.9–3.3)

## 2013-08-31 LAB — UA PROTEIN, DIPSTICK - CHCC: PROTEIN: NEGATIVE mg/dL

## 2013-08-31 NOTE — Progress Notes (Signed)
Checked in new patient with no financial issues. He has no one for emerg contact or release of info

## 2013-08-31 NOTE — Telephone Encounter (Signed)
gv adn printed appt sched and avs for pt for Jan °

## 2013-08-31 NOTE — Telephone Encounter (Signed)
S/w pt and gve np appt 01/21 @ 9:30 w/Dr. Alvy Bimler Dx-Hypoglobulinemia

## 2013-08-31 NOTE — Progress Notes (Signed)
[place] NOTE  Patient Care Team: Thressa Sheller, MD as PCP - General (Internal Medicine) Jeryl Columbia, MD as Attending Physician (Gastroenterology) Jodi Marble, MD as Attending Physician (Otolaryngology)  CHIEF COMPLAINTS/PURPOSE OF CONSULTATION:  Hyppogammaglobulinemia with recurrent yeast infection  HISTORY OF PRESENTING ILLNESS:  Andrew Osborne 73 y.o. male is here because of low immunoglobulin levels. The patient had recurrent yeast infections for more than 6 months. He was referred to infectious disease department for evaluation of this and blood work revealed global hypogammaglobulinemia. He also had background history of B12 deficiency. He denies severe, recurrent infection requiring hospitalization. He has history of septicemia as a child. Denies history of shingles infection or atypical meningitis. He drink significant amounts of alcohol about 8 shots of liquor per week for many years. He denies history of abnormal bone pain or bone fracture. Patient denies history of recurrent infection or atypical infections such as shingles of meningitis. Denies chills, night sweats, anorexia or abnormal weight loss.  MEDICAL HISTORY:  Past Medical History  Diagnosis Date  . GERD (gastroesophageal reflux disease)   . Barrett's esophagus   . Hyperlipidemia 10-05-12    Gilbert's syndrome"-benign hereditary elevated bilirubin".  . Abnormal chest CT 10-05-12 -- last cxr clear    Epic 1'14-results noted by Dr. Kalman Shan.-  chronic aspiration secondary to gerd  . Gilbert syndrome     benign hereditary elevated bilirubin  . Bladder neoplasm   . Microhematuria   . Nocturia   . Urgency of urination   . Other dysphagia monitored by dr Watt Climes    chronic --  monitors eating habits and takes carafate  . H/O hiatal hernia   . B12 deficiency   . B12 deficiency 08/31/2013  . Proteins serum plasma low 08/31/2013  . Hypogammaglobulinaemia, unspecified 08/31/2013     SURGICAL HISTORY: Past Surgical History  Procedure Laterality Date  . Tonsillectomy    . Colonoscopy    . Esophagogastroduodenoscopy    . Cataract extraction  10-05-12    left eye "'remains with blurred vision"   . Cholecystectomy N/A 10/19/2012    Procedure: LAPAROSCOPIC CHOLECYSTECTOMY WITH INTRAOPERATIVE CHOLANGIOGRAM;  Surgeon: Earnstine Regal, MD;  Location: [facility];  Service: General;  Laterality: N/A;  . Knee arthroscopy Left 1980's  . Cataract extraction w/ intraocular lens implant Left 07-15-2011    post op --  blurred vision  . Cystoscopy with biopsy N/A 11/16/2012    Procedure: CYSTOSCOPY WITH BLADDER BIOPSY AND FULGERATION;  Surgeon: Fredricka Bonine, MD;  Location: [facility];  Service: Urology;  Laterality: N/A;    SOCIAL HISTORY: History   Social History  . Marital Status: Married    Spouse Name: N/A    Number of Children: N/A  . Years of Education: N/A   Occupational History  . Not on file.   Social History Main Topics  . Smoking status: Never Smoker   . Smokeless tobacco: Never Used  . Alcohol Use: 3.0 oz/week    5 Shots of liquor per week     Comment: x5 dks per week  . Drug Use: No  . Sexual Activity: Yes    Partners: Female   Other Topics Concern  . Not on file   Social History Narrative  . No narrative on file    FAMILY HISTORY: Family History  Problem Relation Age of Onset  . Cancer Mother     Liver,Colon Cancer  . Alzheimer's disease Father     ALLERGIES:  is allergic to nizoral.  MEDICATIONS:  Current Outpatient Prescriptions  Medication Sig Dispense Refill  . aspirin 81 MG tablet Take 81 mg by mouth daily.      . Cyanocobalamin (VITAMIN B-12 IJ) Inject as directed every 21 ( twenty-one) days.      Marland Kitchen omeprazole (PRILOSEC) 40 MG capsule Take 40 mg by mouth 2 (two) times daily.       Marland Kitchen Propylene Glycol (SYSTANE BALANCE) 0.6 % SOLN Place 1 drop into both eyes 2 (two) times daily.       . Tamsulosin HCl (FLOMAX)  0.4 MG CAPS Take 0.8 mg by mouth daily after supper.       . niacin 500 MG tablet Take 500 mg by mouth daily with breakfast.       No current facility-administered medications for this visit.    REVIEW OF SYSTEMS:   Eyes: Denies blurriness of vision, double vision or watery eyes Ears, nose, mouth, throat, and face: Denies mucositis or sore throat Respiratory: Denies cough, dyspnea or wheezes Cardiovascular: Denies palpitation, chest discomfort or lower extremity swelling Gastrointestinal:  Denies nausea, heartburn or change in bowel habits Skin: Denies abnormal skin rashes Lymphatics: Denies new lymphadenopathy or easy bruising Neurological:Denies numbness, tingling or new weaknesses Behavioral/Psych: Mood is stable, no new changes  All other systems were reviewed with the patient and are negative.  PHYSICAL EXAMINATION: ECOG PERFORMANCE STATUS: 0 - Asymptomatic  Filed Vitals:   08/31/13 0959  BP: 142/81  Pulse: 72  Temp: 97.7 F (36.5 C)  Resp: 18   Filed Weights   08/31/13 0959  Weight: 166 lb 14.4 oz (75.705 kg)    GENERAL:alert, no distress and comfortable SKIN: skin color, texture, turgor are normal, no rashes or significant lesions EYES: normal, conjunctiva are pink and non-injected, sclera clear OROPHARYNX:no exudate, no erythema and lips, buccal mucosa, and tongue normal. There are evidence of yeast infection in the back of his throat NECK: supple, thyroid normal size, non-tender, without abnormalities LYMPH:  no palpable lymphadenopathy in the cervical, axillary or inguinal LUNGS: clear to auscultation and percussion with normal breathing effort HEART: regular rate & rhythm and no murmurs and no lower extremity edema ABDOMEN:abdomen soft, non-tender and normal bowel sounds  Musculoskeletal:no cyanosis of digits and no clubbing  PSYCH: alert & oriented x 3 with fluent speech NEURO: no focal motor/sensory deficits  LABORATORY DATA:  I have reviewed the data as  listed Lab Results  Component Value Date   WBC 8.2 08/31/2013   HGB 15.1 08/31/2013   HCT 44.3 08/31/2013   MCV 93.1 08/31/2013   PLT 143 08/31/2013   serum IgA and IgM levels were low  ASSESSMENT:  We discussed the approach for low immunoglobulin levels  PLAN:  #1 recurrent yeast infection #2 low immunoglobulin levels #3 significant alcoholism I will proceed to order additional workup today. I will order a urine protein level to screen whether the patient had proteinuria as a cause of low immunoglobulin levels With his background history of chronic B12 deficiency, I am wondering whether this could be related to some form of autoimmune phenomena In the meantime, I recommend the patient to stop his alcohol intake  Orders Placed This Encounter  Procedures  . CBC with Differential    Standing Status: Future     Number of Occurrences: 1     Standing Expiration Date: 08/31/2014  . Hepatic function panel    Standing Status: Future     Number of Occurrences: 1  Standing Expiration Date: 08/31/2014  . SPEP & IFE with QIG    Standing Status: Future     Number of Occurrences: 1     Standing Expiration Date: 08/31/2014  . Kappa/lambda light chains    Standing Status: Future     Number of Occurrences: 1     Standing Expiration Date: 08/31/2014  . Sedimentation rate    Standing Status: Future     Number of Occurrences: 1     Standing Expiration Date: 08/31/2014  . ANA    Standing Status: Future     Number of Occurrences: 1     Standing Expiration Date: 08/31/2014  . UA Protein, Dipstick - [facility]    Standing Status: Future     Number of Occurrences: 1     Standing Expiration Date: 08/31/2014    All questions were answered. The patient knows to call the clinic with any problems, questions or concerns. I spent 40 minutes counseling the patient face to face. The total time spent in the appointment was 55 minutes and more than 50% was on counseling.     [facility], [place], MD 08/31/2013 8:40  PM

## 2013-09-02 LAB — HEPATIC FUNCTION PANEL
ALBUMIN: 3.9 g/dL (ref 3.5–5.2)
ALK PHOS: 63 U/L (ref 39–117)
ALT: 23 U/L (ref 0–53)
AST: 28 U/L (ref 0–37)
Bilirubin, Direct: 0.2 mg/dL (ref 0.0–0.3)
Indirect Bilirubin: 1 mg/dL — ABNORMAL HIGH (ref 0.0–0.9)
Total Bilirubin: 1.2 mg/dL (ref 0.3–1.2)
Total Protein: 6 g/dL (ref 6.0–8.3)

## 2013-09-02 LAB — SPEP & IFE WITH QIG
ALPHA-1-GLOBULIN: 4.2 % (ref 2.9–4.9)
Albumin ELP: 62.5 % (ref 55.8–66.1)
Alpha-2-Globulin: 8 % (ref 7.1–11.8)
BETA GLOBULIN: 5.4 % (ref 4.7–7.2)
Beta 2: 3.3 % (ref 3.2–6.5)
GAMMA GLOBULIN: 16.6 % (ref 11.1–18.8)
IgA: 19 mg/dL — ABNORMAL LOW (ref 68–379)
IgG (Immunoglobin G), Serum: 1130 mg/dL (ref 650–1600)
IgM, Serum: 32 mg/dL — ABNORMAL LOW (ref 41–251)
Total Protein, Serum Electrophoresis: 6 g/dL (ref 6.0–8.3)

## 2013-09-02 LAB — KAPPA/LAMBDA LIGHT CHAINS
Kappa free light chain: 1.3 mg/dL (ref 0.33–1.94)
Kappa:Lambda Ratio: 0.62 (ref 0.26–1.65)
LAMBDA FREE LGHT CHN: 2.1 mg/dL (ref 0.57–2.63)

## 2013-09-02 LAB — ANA: Anti Nuclear Antibody(ANA): NEGATIVE

## 2013-09-02 LAB — SEDIMENTATION RATE: Sed Rate: 1 mm/hr (ref 0–16)

## 2013-09-08 ENCOUNTER — Ambulatory Visit (HOSPITAL_BASED_OUTPATIENT_CLINIC_OR_DEPARTMENT_OTHER): Payer: Medicare Other | Admitting: Hematology and Oncology

## 2013-09-08 VITALS — BP 149/84 | HR 56 | Temp 97.2°F | Resp 20 | Ht 67.0 in | Wt 165.9 lb

## 2013-09-08 DIAGNOSIS — D801 Nonfamilial hypogammaglobulinemia: Secondary | ICD-10-CM

## 2013-09-08 DIAGNOSIS — B379 Candidiasis, unspecified: Secondary | ICD-10-CM

## 2013-09-08 DIAGNOSIS — F1010 Alcohol abuse, uncomplicated: Secondary | ICD-10-CM

## 2013-09-08 MED ORDER — NYSTATIN 100000 UNIT/ML MT SUSP: 5.0000 mL | Freq: Four times a day (QID) | OROMUCOSAL | Status: DC

## 2013-09-08 NOTE — Progress Notes (Signed)
Blair, MD DIAGNOSIS:  hypogammaglobulinemia with recurrent yeast infection  SUMMARY OF HEMATOLOGIC HISTORY: This is a pleasant 47 your gentleman with recurrent yeast infection. He was seen by infectious disease and was noted to have global hypogammaglobulin anemia. The patient also disclosed history of ill fitting dentures and history of septicemia as a child INTERVAL HISTORY: Andrew Osborne 73 y.o. male returns for further followup. He still had persistent mucositis for recurrent yeast infection. Otherwise he feels well  I have reviewed the past medical history, past surgical history, social history and family history with the patient and they are unchanged from previous note.  ALLERGIES:  is allergic to nizoral.  MEDICATIONS:  Current Outpatient Prescriptions  Medication Sig Dispense Refill  . aspirin 81 MG tablet Take 81 mg by mouth daily.      . Cyanocobalamin (VITAMIN B-12 IJ) Inject as directed every 21 ( twenty-one) days.      . niacin 500 MG tablet Take 500 mg by mouth daily with breakfast.      . nystatin (MYCOSTATIN) 100000 UNIT/ML suspension Take 5 mLs (500,000 Units total) by mouth 4 (four) times daily.  480 mL  0  . omeprazole (PRILOSEC) 40 MG capsule Take 40 mg by mouth 2 (two) times daily.       Marland Kitchen Propylene Glycol (SYSTANE BALANCE) 0.6 % SOLN Place 1 drop into both eyes 2 (two) times daily.       . Tamsulosin HCl (FLOMAX) 0.4 MG CAPS Take 0.8 mg by mouth daily after supper.        No current facility-administered medications for this visit.     REVIEW OF SYSTEMS:   Constitutional: Denies fevers, chills or night sweats Behavioral/Psych: Mood is stable, no new changes  All other systems were reviewed with the patient and are negative.  PHYSICAL EXAMINATION: ECOG PERFORMANCE STATUS: 0 - Asymptomatic  Filed Vitals:   09/08/13 1445  BP: 149/84  Pulse: 56  Temp: 97.2 F (36.2 C)  Resp: 20   Filed  Weights   09/08/13 1445  Weight: 165 lb 14.4 oz (75.252 kg)    GENERAL:alert, no distress and comfortable ABDOMEN:abdomen soft, non-tender and normal bowel sounds Musculoskeletal:no cyanosis of digits and no clubbing  NEURO: alert & oriented x 3 with fluent speech, no focal motor/sensory deficits  LABORATORY DATA:  I have reviewed the data as listed No results found for this or any previous visit (from the past 48 hour(s)).  Lab Results  Component Value Date   WBC 8.2 08/31/2013   HGB 15.1 08/31/2013   HCT 44.3 08/31/2013   MCV 93.1 08/31/2013   PLT 143 08/31/2013   ASSESSMENT & PLAN:  #1 hypogammaglobulinemia #2 recurrent yeast infection #3 chronic alcoholism I do not understand why he would have low gammaglobulin levels I recommend second opinion at [facility] and he agreed. In the meantime we will continue nystatin suspension for yeast infection. I also recommend he visit with his dentist about his ill fitting dentures I also discussed with him the importance of staying abstinence from alcohol as this could contribute to low immunity against infection. All questions were answered. The patient knows to call the clinic with any problems, questions or concerns. No barriers to learning was detected.  I spent 15 minutes counseling the patient face to face. The total time spent in the appointment was 20 minutes and more than 50% was on counseling.     [facility], [place], MD 09/08/2013 5:06  PM

## 2013-09-09 ENCOUNTER — Telehealth: Payer: Self-pay | Admitting: Hematology and Oncology

## 2013-09-09 NOTE — Telephone Encounter (Signed)
s.w. pt and advised on Jan 2016 appt....pt ok and aware °

## 2013-09-12 ENCOUNTER — Telehealth: Payer: Self-pay | Admitting: Hematology and Oncology

## 2013-09-12 NOTE — Telephone Encounter (Signed)
Pt appt. To see Dr. Tracey Harries @ [facility] is 09/30/13@2 :00.  Medical records faxed. [facility] from [facility] will call pt with appt.

## 2013-09-14 ENCOUNTER — Encounter: Payer: Self-pay | Admitting: Infectious Diseases

## 2013-09-14 ENCOUNTER — Ambulatory Visit (INDEPENDENT_AMBULATORY_CARE_PROVIDER_SITE_OTHER): Payer: Medicare Other | Admitting: Infectious Diseases

## 2013-09-14 VITALS — BP 158/87 | HR 63 | Temp 97.5°F | Ht 67.0 in | Wt 165.0 lb

## 2013-09-14 DIAGNOSIS — B370 Candidal stomatitis: Secondary | ICD-10-CM

## 2013-09-14 DIAGNOSIS — D801 Nonfamilial hypogammaglobulinemia: Secondary | ICD-10-CM | POA: Diagnosis not present

## 2013-09-14 DIAGNOSIS — I10 Essential (primary) hypertension: Secondary | ICD-10-CM

## 2013-09-14 DIAGNOSIS — K5901 Slow transit constipation: Secondary | ICD-10-CM | POA: Diagnosis not present

## 2013-09-14 DIAGNOSIS — R7309 Other abnormal glucose: Secondary | ICD-10-CM | POA: Diagnosis not present

## 2013-09-14 DIAGNOSIS — R1906 Epigastric swelling, mass or lump: Secondary | ICD-10-CM | POA: Diagnosis not present

## 2013-09-14 NOTE — Assessment & Plan Note (Signed)
Have asked him to f/u with his PCP. His headaches are a reflection of this.

## 2013-09-14 NOTE — Assessment & Plan Note (Signed)
He will continue to take nystatin. If he does not feel this is working, will change to diflucan. Will see him back in 2 months.

## 2013-09-14 NOTE — Progress Notes (Signed)
   Subjective:    Patient ID: Andrew Osborne, male    DOB: 1941-06-12, 73 y.o.   MRN: [id number]  HPI 74 yo M with recurrent thrush.. Found on lab to have low IgA/M levels. Has been seen by H/O. Has eval on 10-05-13. Has been religious about using nystatin TID but on skipping has had severe recurrence. Has been eating well, no change in wt. No dysphagia.  Has not taken diflucan prior.  States that his sister in [place] has the same sx. Is going to be eval by [facility] there.   Review of Systems  Constitutional: Negative for appetite change and unexpected weight change.  HENT: Positive for mouth sores. Negative for sore throat.   Respiratory: Negative for shortness of breath.   Neurological: Positive for headaches.       Objective:   Physical Exam  Constitutional: He appears well-developed and well-nourished.  Eyes: EOM are normal. Pupils are equal, round, and reactive to light.  Neck: Neck supple.  Cardiovascular: Normal rate, regular rhythm and normal heart sounds.   Pulmonary/Chest: Effort normal and breath sounds normal.  Abdominal: Soft. Bowel sounds are normal. There is no tenderness.  Lymphadenopathy:    He has no cervical adenopathy.       Assessment & Plan:

## 2013-09-14 NOTE — Assessment & Plan Note (Addendum)
Await his eval at [facility]. May need BMBx, cytogenetics/flow. He will let us know what his sisters w/u shows.

## 2013-09-19 DIAGNOSIS — E538 Deficiency of other specified B group vitamins: Secondary | ICD-10-CM | POA: Diagnosis not present

## 2013-09-30 DIAGNOSIS — B370 Candidal stomatitis: Secondary | ICD-10-CM | POA: Diagnosis not present

## 2013-09-30 DIAGNOSIS — D801 Nonfamilial hypogammaglobulinemia: Secondary | ICD-10-CM | POA: Diagnosis not present

## 2013-10-03 DIAGNOSIS — R7301 Impaired fasting glucose: Secondary | ICD-10-CM | POA: Diagnosis not present

## 2013-10-03 DIAGNOSIS — Z125 Encounter for screening for malignant neoplasm of prostate: Secondary | ICD-10-CM | POA: Diagnosis not present

## 2013-10-03 DIAGNOSIS — E785 Hyperlipidemia, unspecified: Secondary | ICD-10-CM | POA: Diagnosis not present

## 2013-10-03 DIAGNOSIS — E559 Vitamin D deficiency, unspecified: Secondary | ICD-10-CM | POA: Diagnosis not present

## 2013-10-03 DIAGNOSIS — E538 Deficiency of other specified B group vitamins: Secondary | ICD-10-CM | POA: Diagnosis not present

## 2013-10-05 ENCOUNTER — Other Ambulatory Visit: Payer: Self-pay | Admitting: Dermatology

## 2013-10-05 DIAGNOSIS — B070 Plantar wart: Secondary | ICD-10-CM | POA: Diagnosis not present

## 2013-10-05 DIAGNOSIS — D485 Neoplasm of uncertain behavior of skin: Secondary | ICD-10-CM | POA: Diagnosis not present

## 2013-10-05 DIAGNOSIS — L259 Unspecified contact dermatitis, unspecified cause: Secondary | ICD-10-CM | POA: Diagnosis not present

## 2013-10-24 DIAGNOSIS — R7301 Impaired fasting glucose: Secondary | ICD-10-CM | POA: Diagnosis not present

## 2013-10-24 DIAGNOSIS — N183 Chronic kidney disease, stage 3 unspecified: Secondary | ICD-10-CM | POA: Diagnosis not present

## 2013-10-24 DIAGNOSIS — K219 Gastro-esophageal reflux disease without esophagitis: Secondary | ICD-10-CM | POA: Diagnosis not present

## 2013-10-24 DIAGNOSIS — E538 Deficiency of other specified B group vitamins: Secondary | ICD-10-CM | POA: Diagnosis not present

## 2013-10-24 DIAGNOSIS — E785 Hyperlipidemia, unspecified: Secondary | ICD-10-CM | POA: Diagnosis not present

## 2013-11-10 ENCOUNTER — Ambulatory Visit: Payer: Medicare Other | Admitting: Infectious Diseases

## 2013-11-15 DIAGNOSIS — E538 Deficiency of other specified B group vitamins: Secondary | ICD-10-CM | POA: Diagnosis not present

## 2013-11-17 DIAGNOSIS — W57XXXA Bitten or stung by nonvenomous insect and other nonvenomous arthropods, initial encounter: Secondary | ICD-10-CM | POA: Diagnosis not present

## 2013-11-17 DIAGNOSIS — T148 Other injury of unspecified body region: Secondary | ICD-10-CM | POA: Diagnosis not present

## 2013-11-21 ENCOUNTER — Encounter: Payer: Self-pay | Admitting: Infectious Diseases

## 2013-11-21 ENCOUNTER — Ambulatory Visit (INDEPENDENT_AMBULATORY_CARE_PROVIDER_SITE_OTHER): Payer: Medicare Other | Admitting: Infectious Diseases

## 2013-11-21 VITALS — BP 125/86 | HR 63 | Temp 97.7°F | Ht 67.0 in | Wt 164.0 lb

## 2013-11-21 DIAGNOSIS — D801 Nonfamilial hypogammaglobulinemia: Secondary | ICD-10-CM | POA: Diagnosis not present

## 2013-11-21 DIAGNOSIS — R7309 Other abnormal glucose: Secondary | ICD-10-CM | POA: Diagnosis not present

## 2013-11-21 DIAGNOSIS — K5901 Slow transit constipation: Secondary | ICD-10-CM | POA: Diagnosis not present

## 2013-11-21 DIAGNOSIS — R1906 Epigastric swelling, mass or lump: Secondary | ICD-10-CM | POA: Diagnosis not present

## 2013-11-21 DIAGNOSIS — I10 Essential (primary) hypertension: Secondary | ICD-10-CM | POA: Diagnosis not present

## 2013-11-21 DIAGNOSIS — B370 Candidal stomatitis: Secondary | ICD-10-CM | POA: Diagnosis not present

## 2013-11-21 NOTE — Assessment & Plan Note (Signed)
He is doing well on his current regimen of topical rx. Await his eval at [facility] for completion of his w/u. Will rtc prn.

## 2013-11-21 NOTE — Progress Notes (Signed)
   Subjective:    Patient ID: Andrew Osborne, male    DOB: 1940/08/20, 73 y.o.   MRN: [id number]  HPI 73 yo M with recurrent thrush.. Found on lab to have low IgA/M levels. Has been seen by H/O. Has been eval at [facility] as well as [facility].  Has been mor regimented about his medications as well as using a denture cleaner BID. Has minimal dryness in his mouth.  Has been playing golf/tennis 3-4/wk.   Has f/u appt with [facility] pending.   Review of Systems  Constitutional: Negative for fever, chills, appetite change and unexpected weight change.  Respiratory: Negative for shortness of breath.   Gastrointestinal: Negative for diarrhea and constipation.  Genitourinary: Negative for difficulty urinating.  prn laxative (rare).      Objective:   Physical Exam  Constitutional: He appears well-developed and well-nourished.  HENT:  Mouth/Throat: No oropharyngeal exudate.  Eyes: EOM are normal. Pupils are equal, round, and reactive to light.  Neck: Neck supple.  Cardiovascular: Normal rate, regular rhythm and normal heart sounds.   Pulmonary/Chest: Effort normal and breath sounds normal.  Abdominal: Soft. Bowel sounds are normal. He exhibits no distension. There is no tenderness.  Lymphadenopathy:    He has no cervical adenopathy.          Assessment & Plan:

## 2013-11-25 DIAGNOSIS — K117 Disturbances of salivary secretion: Secondary | ICD-10-CM | POA: Diagnosis not present

## 2013-11-25 DIAGNOSIS — R51 Headache: Secondary | ICD-10-CM | POA: Diagnosis not present

## 2013-11-25 DIAGNOSIS — B370 Candidal stomatitis: Secondary | ICD-10-CM | POA: Diagnosis not present

## 2013-11-25 DIAGNOSIS — D801 Nonfamilial hypogammaglobulinemia: Secondary | ICD-10-CM | POA: Diagnosis not present

## 2013-12-01 ENCOUNTER — Other Ambulatory Visit: Payer: Self-pay | Admitting: Hematology and Oncology

## 2013-12-01 NOTE — Telephone Encounter (Signed)
No further refill after this

## 2013-12-07 DIAGNOSIS — E538 Deficiency of other specified B group vitamins: Secondary | ICD-10-CM | POA: Diagnosis not present

## 2013-12-15 DIAGNOSIS — R930 Abnormal findings on diagnostic imaging of skull and head, not elsewhere classified: Secondary | ICD-10-CM | POA: Diagnosis not present

## 2013-12-15 DIAGNOSIS — R51 Headache: Secondary | ICD-10-CM | POA: Diagnosis not present

## 2014-01-03 DIAGNOSIS — E538 Deficiency of other specified B group vitamins: Secondary | ICD-10-CM | POA: Diagnosis not present

## 2014-01-17 ENCOUNTER — Other Ambulatory Visit: Payer: Self-pay | Admitting: Hematology and Oncology

## 2014-01-19 DIAGNOSIS — E538 Deficiency of other specified B group vitamins: Secondary | ICD-10-CM | POA: Diagnosis not present

## 2014-01-20 DIAGNOSIS — R5383 Other fatigue: Secondary | ICD-10-CM | POA: Diagnosis not present

## 2014-01-20 DIAGNOSIS — R5381 Other malaise: Secondary | ICD-10-CM | POA: Diagnosis not present

## 2014-01-20 DIAGNOSIS — R51 Headache: Secondary | ICD-10-CM | POA: Diagnosis not present

## 2014-01-20 DIAGNOSIS — E538 Deficiency of other specified B group vitamins: Secondary | ICD-10-CM | POA: Diagnosis not present

## 2014-01-20 DIAGNOSIS — E559 Vitamin D deficiency, unspecified: Secondary | ICD-10-CM | POA: Diagnosis not present

## 2014-02-14 DIAGNOSIS — Z862 Personal history of diseases of the blood and blood-forming organs and certain disorders involving the immune mechanism: Secondary | ICD-10-CM | POA: Diagnosis not present

## 2014-02-14 DIAGNOSIS — Z8639 Personal history of other endocrine, nutritional and metabolic disease: Secondary | ICD-10-CM | POA: Diagnosis not present

## 2014-02-15 DIAGNOSIS — IMO0002 Reserved for concepts with insufficient information to code with codable children: Secondary | ICD-10-CM | POA: Diagnosis not present

## 2014-02-20 DIAGNOSIS — L080 Pyoderma: Secondary | ICD-10-CM | POA: Diagnosis not present

## 2014-02-20 DIAGNOSIS — L089 Local infection of the skin and subcutaneous tissue, unspecified: Secondary | ICD-10-CM | POA: Diagnosis not present

## 2014-02-20 DIAGNOSIS — IMO0002 Reserved for concepts with insufficient information to code with codable children: Secondary | ICD-10-CM | POA: Diagnosis not present

## 2014-03-08 DIAGNOSIS — E538 Deficiency of other specified B group vitamins: Secondary | ICD-10-CM | POA: Diagnosis not present

## 2014-03-21 ENCOUNTER — Other Ambulatory Visit: Payer: Self-pay | Admitting: Hematology and Oncology

## 2014-03-24 ENCOUNTER — Other Ambulatory Visit: Payer: Self-pay | Admitting: *Deleted

## 2014-03-24 DIAGNOSIS — B370 Candidal stomatitis: Secondary | ICD-10-CM

## 2014-03-24 MED ORDER — NYSTATIN 100000 UNIT/ML MT SUSP: OROMUCOSAL | Status: DC

## 2014-03-29 ENCOUNTER — Encounter: Payer: Self-pay | Admitting: Infectious Diseases

## 2014-03-29 ENCOUNTER — Ambulatory Visit (INDEPENDENT_AMBULATORY_CARE_PROVIDER_SITE_OTHER): Payer: Medicare Other | Admitting: Infectious Diseases

## 2014-03-29 VITALS — BP 149/81 | HR 63 | Temp 97.6°F | Ht 67.0 in | Wt 165.0 lb

## 2014-03-29 DIAGNOSIS — D801 Nonfamilial hypogammaglobulinemia: Secondary | ICD-10-CM | POA: Diagnosis not present

## 2014-03-29 DIAGNOSIS — B370 Candidal stomatitis: Secondary | ICD-10-CM

## 2014-03-29 MED ORDER — NYSTATIN 100000 UNIT/ML MT SUSP: OROMUCOSAL | Status: DC

## 2014-03-29 NOTE — Progress Notes (Signed)
   Subjective:    Patient ID: TEJUAN GHOLSON, male    DOB: 01/28/1941, 73 y.o.   MRN: [id number]  HPI 73 yo M with recurrent thrush.. Found on lab to have low IgA/M levels. Has been seen by H/O. Has been eval at [facility] as well as [facility]. Has been told no "earthly thing" about his dx at [facility].  Has had hip and knee pain for last 2 weeks. Has had to quit playing golf and tennis.   No further oral sores. Has tried x2 to stop nystatin suspension and each time has recurrence. Has been swishing and swallowing.   Review of Systems     Objective:   Physical Exam  Constitutional: He appears well-developed and well-nourished.  HENT:  Mouth/Throat: No oropharyngeal exudate.  Eyes: EOM are normal. Pupils are equal, round, and reactive to light.  Neck: Neck supple.  Cardiovascular: Normal rate, regular rhythm and normal heart sounds.   Pulmonary/Chest: Effort normal and breath sounds normal.  Abdominal: Soft. Bowel sounds are normal. He exhibits no distension. There is no tenderness.  Lymphadenopathy:    He has no cervical adenopathy.          Assessment & Plan:

## 2014-03-29 NOTE — Assessment & Plan Note (Signed)
This seems to be controlled on nystatin. We spoke about this not being absorbed and therefore having a low toxicity profile. There is a very low chance that his yeast could become resistant to this but his immunodeficiency is not severe enough for that to happen (typically). I will refill his nystatin as needed. Will see him back as needed.

## 2014-03-29 NOTE — Assessment & Plan Note (Signed)
He has f/u with immunology at [facility]. I greatly appreciate their assistance.

## 2014-04-06 DIAGNOSIS — E538 Deficiency of other specified B group vitamins: Secondary | ICD-10-CM | POA: Diagnosis not present

## 2014-05-03 DIAGNOSIS — E538 Deficiency of other specified B group vitamins: Secondary | ICD-10-CM | POA: Diagnosis not present

## 2014-05-25 DIAGNOSIS — N401 Enlarged prostate with lower urinary tract symptoms: Secondary | ICD-10-CM | POA: Diagnosis not present

## 2014-05-25 DIAGNOSIS — R350 Frequency of micturition: Secondary | ICD-10-CM | POA: Diagnosis not present

## 2014-06-02 DIAGNOSIS — E538 Deficiency of other specified B group vitamins: Secondary | ICD-10-CM | POA: Diagnosis not present

## 2014-06-27 DIAGNOSIS — D801 Nonfamilial hypogammaglobulinemia: Secondary | ICD-10-CM | POA: Diagnosis not present

## 2014-06-27 DIAGNOSIS — E538 Deficiency of other specified B group vitamins: Secondary | ICD-10-CM | POA: Diagnosis not present

## 2014-06-27 DIAGNOSIS — Z9049 Acquired absence of other specified parts of digestive tract: Secondary | ICD-10-CM | POA: Diagnosis not present

## 2014-06-27 DIAGNOSIS — D809 Immunodeficiency with predominantly antibody defects, unspecified: Secondary | ICD-10-CM | POA: Diagnosis not present

## 2014-06-29 DIAGNOSIS — N289 Disorder of kidney and ureter, unspecified: Secondary | ICD-10-CM | POA: Diagnosis not present

## 2014-06-29 DIAGNOSIS — D809 Immunodeficiency with predominantly antibody defects, unspecified: Secondary | ICD-10-CM | POA: Diagnosis not present

## 2014-06-29 DIAGNOSIS — N281 Cyst of kidney, acquired: Secondary | ICD-10-CM | POA: Diagnosis not present

## 2014-06-29 DIAGNOSIS — Z9049 Acquired absence of other specified parts of digestive tract: Secondary | ICD-10-CM | POA: Diagnosis not present

## 2014-06-29 DIAGNOSIS — Z862 Personal history of diseases of the blood and blood-forming organs and certain disorders involving the immune mechanism: Secondary | ICD-10-CM | POA: Diagnosis not present

## 2014-06-29 DIAGNOSIS — R935 Abnormal findings on diagnostic imaging of other abdominal regions, including retroperitoneum: Secondary | ICD-10-CM | POA: Diagnosis not present

## 2014-07-20 DIAGNOSIS — E538 Deficiency of other specified B group vitamins: Secondary | ICD-10-CM | POA: Diagnosis not present

## 2014-08-16 DIAGNOSIS — E538 Deficiency of other specified B group vitamins: Secondary | ICD-10-CM | POA: Diagnosis not present

## 2014-08-23 DIAGNOSIS — K219 Gastro-esophageal reflux disease without esophagitis: Secondary | ICD-10-CM | POA: Diagnosis not present

## 2014-09-01 ENCOUNTER — Other Ambulatory Visit: Payer: Medicare Other

## 2014-09-06 DIAGNOSIS — N401 Enlarged prostate with lower urinary tract symptoms: Secondary | ICD-10-CM | POA: Diagnosis not present

## 2014-09-06 DIAGNOSIS — R350 Frequency of micturition: Secondary | ICD-10-CM | POA: Diagnosis not present

## 2014-09-07 DIAGNOSIS — E538 Deficiency of other specified B group vitamins: Secondary | ICD-10-CM | POA: Diagnosis not present

## 2014-09-08 ENCOUNTER — Telehealth: Payer: Self-pay | Admitting: Hematology and Oncology

## 2014-09-08 ENCOUNTER — Ambulatory Visit: Payer: Medicare Other | Admitting: Hematology and Oncology

## 2014-09-08 NOTE — Telephone Encounter (Signed)
pt came in late today - NG gone - r/s appt from 1/29 tp 3/4 - pt has new d/t

## 2014-09-12 ENCOUNTER — Other Ambulatory Visit: Payer: Self-pay | Admitting: Dermatology

## 2014-09-12 DIAGNOSIS — L309 Dermatitis, unspecified: Secondary | ICD-10-CM | POA: Diagnosis not present

## 2014-09-12 DIAGNOSIS — D485 Neoplasm of uncertain behavior of skin: Secondary | ICD-10-CM | POA: Diagnosis not present

## 2014-09-12 DIAGNOSIS — L430 Hypertrophic lichen planus: Secondary | ICD-10-CM | POA: Diagnosis not present

## 2014-10-12 ENCOUNTER — Telehealth: Payer: Self-pay | Admitting: Hematology and Oncology

## 2014-10-12 DIAGNOSIS — E538 Deficiency of other specified B group vitamins: Secondary | ICD-10-CM | POA: Diagnosis not present

## 2014-10-12 NOTE — Telephone Encounter (Signed)
pt came in today and cxd 3/4 appt due to wife being d/c from hosp early (today) - per pt he will call back to r/s when therapy for his wife has been set up.

## 2014-10-13 ENCOUNTER — Ambulatory Visit: Payer: Medicare Other | Admitting: Hematology and Oncology

## 2014-11-08 DIAGNOSIS — E538 Deficiency of other specified B group vitamins: Secondary | ICD-10-CM | POA: Diagnosis not present

## 2014-12-07 DIAGNOSIS — E538 Deficiency of other specified B group vitamins: Secondary | ICD-10-CM | POA: Diagnosis not present

## 2014-12-14 DIAGNOSIS — J309 Allergic rhinitis, unspecified: Secondary | ICD-10-CM | POA: Diagnosis not present

## 2014-12-27 DIAGNOSIS — L570 Actinic keratosis: Secondary | ICD-10-CM | POA: Diagnosis not present

## 2014-12-27 DIAGNOSIS — D239 Other benign neoplasm of skin, unspecified: Secondary | ICD-10-CM | POA: Diagnosis not present

## 2014-12-29 DIAGNOSIS — E538 Deficiency of other specified B group vitamins: Secondary | ICD-10-CM | POA: Diagnosis not present

## 2015-01-17 DIAGNOSIS — E538 Deficiency of other specified B group vitamins: Secondary | ICD-10-CM | POA: Diagnosis not present

## 2015-02-05 DIAGNOSIS — E538 Deficiency of other specified B group vitamins: Secondary | ICD-10-CM | POA: Diagnosis not present

## 2015-03-02 DIAGNOSIS — E538 Deficiency of other specified B group vitamins: Secondary | ICD-10-CM | POA: Diagnosis not present

## 2015-03-15 DIAGNOSIS — N401 Enlarged prostate with lower urinary tract symptoms: Secondary | ICD-10-CM | POA: Diagnosis not present

## 2015-03-15 DIAGNOSIS — R3915 Urgency of urination: Secondary | ICD-10-CM | POA: Diagnosis not present

## 2015-03-21 DIAGNOSIS — L821 Other seborrheic keratosis: Secondary | ICD-10-CM | POA: Diagnosis not present

## 2015-03-21 DIAGNOSIS — D239 Other benign neoplasm of skin, unspecified: Secondary | ICD-10-CM | POA: Diagnosis not present

## 2015-03-23 DIAGNOSIS — E538 Deficiency of other specified B group vitamins: Secondary | ICD-10-CM | POA: Diagnosis not present

## 2015-04-20 DIAGNOSIS — E538 Deficiency of other specified B group vitamins: Secondary | ICD-10-CM | POA: Diagnosis not present

## 2015-04-27 ENCOUNTER — Other Ambulatory Visit: Payer: Self-pay | Admitting: Infectious Diseases

## 2015-05-18 DIAGNOSIS — E538 Deficiency of other specified B group vitamins: Secondary | ICD-10-CM | POA: Diagnosis not present

## 2015-06-14 DIAGNOSIS — E538 Deficiency of other specified B group vitamins: Secondary | ICD-10-CM | POA: Diagnosis not present

## 2015-06-20 ENCOUNTER — Other Ambulatory Visit: Payer: Self-pay | Admitting: Dermatology

## 2015-06-20 DIAGNOSIS — N401 Enlarged prostate with lower urinary tract symptoms: Secondary | ICD-10-CM | POA: Diagnosis not present

## 2015-06-20 DIAGNOSIS — D239 Other benign neoplasm of skin, unspecified: Secondary | ICD-10-CM | POA: Diagnosis not present

## 2015-06-20 DIAGNOSIS — R350 Frequency of micturition: Secondary | ICD-10-CM | POA: Diagnosis not present

## 2015-06-20 DIAGNOSIS — D485 Neoplasm of uncertain behavior of skin: Secondary | ICD-10-CM | POA: Diagnosis not present

## 2015-06-20 DIAGNOSIS — D225 Melanocytic nevi of trunk: Secondary | ICD-10-CM | POA: Diagnosis not present

## 2015-06-20 DIAGNOSIS — L739 Follicular disorder, unspecified: Secondary | ICD-10-CM | POA: Diagnosis not present

## 2015-06-20 DIAGNOSIS — D692 Other nonthrombocytopenic purpura: Secondary | ICD-10-CM | POA: Diagnosis not present

## 2015-06-21 DIAGNOSIS — M26621 Arthralgia of right temporomandibular joint: Secondary | ICD-10-CM | POA: Diagnosis not present

## 2015-06-21 DIAGNOSIS — H9201 Otalgia, right ear: Secondary | ICD-10-CM | POA: Diagnosis not present

## 2015-07-03 DIAGNOSIS — E538 Deficiency of other specified B group vitamins: Secondary | ICD-10-CM | POA: Diagnosis not present

## 2015-07-26 ENCOUNTER — Other Ambulatory Visit: Payer: Self-pay | Admitting: Dermatology

## 2015-07-26 DIAGNOSIS — L089 Local infection of the skin and subcutaneous tissue, unspecified: Secondary | ICD-10-CM | POA: Diagnosis not present

## 2015-07-26 DIAGNOSIS — B029 Zoster without complications: Secondary | ICD-10-CM | POA: Diagnosis not present

## 2015-07-26 DIAGNOSIS — D485 Neoplasm of uncertain behavior of skin: Secondary | ICD-10-CM | POA: Diagnosis not present

## 2015-07-31 DIAGNOSIS — E538 Deficiency of other specified B group vitamins: Secondary | ICD-10-CM | POA: Diagnosis not present

## 2015-08-22 DIAGNOSIS — E538 Deficiency of other specified B group vitamins: Secondary | ICD-10-CM | POA: Diagnosis not present

## 2015-09-17 DIAGNOSIS — E538 Deficiency of other specified B group vitamins: Secondary | ICD-10-CM | POA: Diagnosis not present

## 2015-09-22 DIAGNOSIS — J40 Bronchitis, not specified as acute or chronic: Secondary | ICD-10-CM | POA: Diagnosis not present

## 2015-10-04 ENCOUNTER — Other Ambulatory Visit: Payer: Self-pay | Admitting: Infectious Diseases

## 2015-10-06 ENCOUNTER — Other Ambulatory Visit: Payer: Self-pay | Admitting: Infectious Diseases

## 2015-10-06 DIAGNOSIS — B370 Candidal stomatitis: Secondary | ICD-10-CM

## 2015-10-24 DIAGNOSIS — L910 Hypertrophic scar: Secondary | ICD-10-CM | POA: Diagnosis not present

## 2015-10-24 DIAGNOSIS — D239 Other benign neoplasm of skin, unspecified: Secondary | ICD-10-CM | POA: Diagnosis not present

## 2015-10-25 DIAGNOSIS — J309 Allergic rhinitis, unspecified: Secondary | ICD-10-CM | POA: Diagnosis not present

## 2015-11-26 DIAGNOSIS — D239 Other benign neoplasm of skin, unspecified: Secondary | ICD-10-CM | POA: Diagnosis not present

## 2015-11-26 DIAGNOSIS — L910 Hypertrophic scar: Secondary | ICD-10-CM | POA: Diagnosis not present

## 2015-12-07 DIAGNOSIS — E538 Deficiency of other specified B group vitamins: Secondary | ICD-10-CM | POA: Diagnosis not present

## 2015-12-12 DIAGNOSIS — R0781 Pleurodynia: Secondary | ICD-10-CM | POA: Diagnosis not present

## 2015-12-12 DIAGNOSIS — R0789 Other chest pain: Secondary | ICD-10-CM | POA: Diagnosis not present

## 2016-01-04 DIAGNOSIS — E538 Deficiency of other specified B group vitamins: Secondary | ICD-10-CM | POA: Diagnosis not present

## 2016-01-23 DIAGNOSIS — E538 Deficiency of other specified B group vitamins: Secondary | ICD-10-CM | POA: Diagnosis not present

## 2016-02-07 DIAGNOSIS — L821 Other seborrheic keratosis: Secondary | ICD-10-CM | POA: Diagnosis not present

## 2016-02-07 DIAGNOSIS — L259 Unspecified contact dermatitis, unspecified cause: Secondary | ICD-10-CM | POA: Diagnosis not present

## 2016-02-07 DIAGNOSIS — L910 Hypertrophic scar: Secondary | ICD-10-CM | POA: Diagnosis not present

## 2016-02-12 ENCOUNTER — Encounter (HOSPITAL_COMMUNITY): Payer: Self-pay

## 2016-02-12 ENCOUNTER — Emergency Department (HOSPITAL_COMMUNITY)
Admission: EM | Admit: 2016-02-12 | Discharge: 2016-02-12 | Disposition: A | Discharge status: Home / Self Care | Payer: No Typology Code available for payment source | Attending: Emergency Medicine | Admitting: Emergency Medicine

## 2016-02-12 DIAGNOSIS — S0181XA Laceration without foreign body of other part of head, initial encounter: Secondary | ICD-10-CM | POA: Diagnosis not present

## 2016-02-12 DIAGNOSIS — Y999 Unspecified external cause status: Secondary | ICD-10-CM | POA: Insufficient documentation

## 2016-02-12 DIAGNOSIS — Z79899 Other long term (current) drug therapy: Secondary | ICD-10-CM | POA: Insufficient documentation

## 2016-02-12 DIAGNOSIS — E785 Hyperlipidemia, unspecified: Secondary | ICD-10-CM | POA: Insufficient documentation

## 2016-02-12 DIAGNOSIS — S0990XA Unspecified injury of head, initial encounter: Secondary | ICD-10-CM | POA: Diagnosis not present

## 2016-02-12 DIAGNOSIS — Z8551 Personal history of malignant neoplasm of bladder: Secondary | ICD-10-CM | POA: Diagnosis not present

## 2016-02-12 DIAGNOSIS — S0121XA Laceration without foreign body of nose, initial encounter: Secondary | ICD-10-CM | POA: Diagnosis not present

## 2016-02-12 DIAGNOSIS — Z792 Long term (current) use of antibiotics: Secondary | ICD-10-CM | POA: Insufficient documentation

## 2016-02-12 DIAGNOSIS — Y9389 Activity, other specified: Secondary | ICD-10-CM | POA: Insufficient documentation

## 2016-02-12 DIAGNOSIS — Y92410 Unspecified street and highway as the place of occurrence of the external cause: Secondary | ICD-10-CM | POA: Insufficient documentation

## 2016-02-12 NOTE — Discharge Instructions (Signed)
If you were given medicines take as directed.  If you are on coumadin or contraceptives realize their levels and effectiveness is altered by many different medicines.  If you have any reaction (rash, tongues swelling, other) to the medicines stop taking and see a physician.   Keep wound dry for 12 hrs.  Watch for signs of infection  If your blood pressure was elevated in the ER make sure you follow up for management with a primary doctor or return for chest pain, shortness of breath or stroke symptoms.  Please follow up as directed and return to the ER or see a physician for new or worsening symptoms.  Thank you. Filed Vitals:   02/12/16 1726  BP: 134/95  Pulse: 78  Temp: 97.8 F (36.6 C)  TempSrc: Oral  Resp: 18  SpO2: 99%

## 2016-02-12 NOTE — ED Notes (Signed)
Provider at bedside
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
## 2016-02-12 NOTE — ED Notes (Signed)
Dermabond at bedside. Notified provider.
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
## 2016-02-13 ENCOUNTER — Emergency Department (HOSPITAL_COMMUNITY): Payer: No Typology Code available for payment source

## 2016-02-13 ENCOUNTER — Encounter (HOSPITAL_COMMUNITY): Payer: Self-pay | Admitting: Emergency Medicine

## 2016-02-13 ENCOUNTER — Emergency Department (HOSPITAL_COMMUNITY)
Admission: EM | Admit: 2016-02-13 | Discharge: 2016-02-13 | Disposition: A | Discharge status: Home / Self Care | Payer: No Typology Code available for payment source | Attending: Emergency Medicine | Admitting: Emergency Medicine

## 2016-02-13 DIAGNOSIS — S0031XA Abrasion of nose, initial encounter: Secondary | ICD-10-CM | POA: Diagnosis not present

## 2016-02-13 DIAGNOSIS — E785 Hyperlipidemia, unspecified: Secondary | ICD-10-CM | POA: Diagnosis not present

## 2016-02-13 DIAGNOSIS — S0512XA Contusion of eyeball and orbital tissues, left eye, initial encounter: Secondary | ICD-10-CM

## 2016-02-13 DIAGNOSIS — Y92410 Unspecified street and highway as the place of occurrence of the external cause: Secondary | ICD-10-CM | POA: Insufficient documentation

## 2016-02-13 DIAGNOSIS — Y939 Activity, unspecified: Secondary | ICD-10-CM | POA: Diagnosis not present

## 2016-02-13 DIAGNOSIS — S0510XA Contusion of eyeball and orbital tissues, unspecified eye, initial encounter: Secondary | ICD-10-CM | POA: Diagnosis not present

## 2016-02-13 DIAGNOSIS — Y999 Unspecified external cause status: Secondary | ICD-10-CM | POA: Insufficient documentation

## 2016-02-13 DIAGNOSIS — S0012XA Contusion of left eyelid and periocular area, initial encounter: Secondary | ICD-10-CM | POA: Diagnosis not present

## 2016-02-13 DIAGNOSIS — R51 Headache: Secondary | ICD-10-CM | POA: Diagnosis not present

## 2016-02-13 DIAGNOSIS — R519 Headache, unspecified: Secondary | ICD-10-CM

## 2016-02-13 DIAGNOSIS — S00211A Abrasion of right eyelid and periocular area, initial encounter: Secondary | ICD-10-CM | POA: Diagnosis not present

## 2016-02-13 DIAGNOSIS — S0990XA Unspecified injury of head, initial encounter: Secondary | ICD-10-CM | POA: Diagnosis not present
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
## 2016-02-14 DIAGNOSIS — H9113 Presbycusis, bilateral: Secondary | ICD-10-CM | POA: Diagnosis not present

## 2016-02-14 DIAGNOSIS — R51 Headache: Secondary | ICD-10-CM | POA: Diagnosis not present

## 2016-02-14 DIAGNOSIS — H9121 Sudden idiopathic hearing loss, right ear: Secondary | ICD-10-CM | POA: Diagnosis not present

## 2016-02-14 DIAGNOSIS — H903 Sensorineural hearing loss, bilateral: Secondary | ICD-10-CM | POA: Diagnosis not present

## 2016-02-18 DIAGNOSIS — E538 Deficiency of other specified B group vitamins: Secondary | ICD-10-CM | POA: Diagnosis not present

## 2016-03-11 ENCOUNTER — Encounter (HOSPITAL_COMMUNITY): Payer: Self-pay | Admitting: Emergency Medicine

## 2016-03-11 ENCOUNTER — Emergency Department (HOSPITAL_COMMUNITY)
Admission: EM | Admit: 2016-03-11 | Discharge: 2016-03-11 | Disposition: A | Discharge status: Home / Self Care | Payer: No Typology Code available for payment source | Attending: Emergency Medicine | Admitting: Emergency Medicine

## 2016-03-11 DIAGNOSIS — S060X9A Concussion with loss of consciousness of unspecified duration, initial encounter: Secondary | ICD-10-CM | POA: Insufficient documentation

## 2016-03-11 DIAGNOSIS — Y92410 Unspecified street and highway as the place of occurrence of the external cause: Secondary | ICD-10-CM | POA: Diagnosis not present

## 2016-03-11 DIAGNOSIS — I10 Essential (primary) hypertension: Secondary | ICD-10-CM | POA: Insufficient documentation

## 2016-03-11 DIAGNOSIS — Y939 Activity, unspecified: Secondary | ICD-10-CM | POA: Insufficient documentation

## 2016-03-11 DIAGNOSIS — Y999 Unspecified external cause status: Secondary | ICD-10-CM | POA: Insufficient documentation

## 2016-03-11 DIAGNOSIS — Z8551 Personal history of malignant neoplasm of bladder: Secondary | ICD-10-CM | POA: Insufficient documentation

## 2016-03-11 DIAGNOSIS — Z79899 Other long term (current) drug therapy: Secondary | ICD-10-CM | POA: Insufficient documentation

## 2016-03-11 DIAGNOSIS — R51 Headache: Secondary | ICD-10-CM | POA: Diagnosis present
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
## 2016-03-17 DIAGNOSIS — E538 Deficiency of other specified B group vitamins: Secondary | ICD-10-CM | POA: Diagnosis not present

## 2016-04-09 DIAGNOSIS — E538 Deficiency of other specified B group vitamins: Secondary | ICD-10-CM | POA: Diagnosis not present

## 2016-05-23 DIAGNOSIS — Z23 Encounter for immunization: Secondary | ICD-10-CM | POA: Diagnosis not present

## 2016-05-23 DIAGNOSIS — E538 Deficiency of other specified B group vitamins: Secondary | ICD-10-CM | POA: Diagnosis not present

## 2016-07-09 DIAGNOSIS — E538 Deficiency of other specified B group vitamins: Secondary | ICD-10-CM | POA: Diagnosis not present

## 2016-07-21 DIAGNOSIS — D2330 Other benign neoplasm of skin of unspecified part of face: Secondary | ICD-10-CM | POA: Diagnosis not present

## 2016-07-21 DIAGNOSIS — B070 Plantar wart: Secondary | ICD-10-CM | POA: Diagnosis not present

## 2016-08-18 DIAGNOSIS — N400 Enlarged prostate without lower urinary tract symptoms: Secondary | ICD-10-CM | POA: Diagnosis not present

## 2016-08-18 DIAGNOSIS — N401 Enlarged prostate with lower urinary tract symptoms: Secondary | ICD-10-CM | POA: Diagnosis not present

## 2016-10-28 ENCOUNTER — Other Ambulatory Visit: Payer: Self-pay | Admitting: Dermatology

## 2016-10-28 DIAGNOSIS — L570 Actinic keratosis: Secondary | ICD-10-CM | POA: Diagnosis not present

## 2016-10-28 DIAGNOSIS — C44319 Basal cell carcinoma of skin of other parts of face: Secondary | ICD-10-CM | POA: Diagnosis not present

## 2016-10-28 DIAGNOSIS — L821 Other seborrheic keratosis: Secondary | ICD-10-CM | POA: Diagnosis not present

## 2016-10-29 DIAGNOSIS — B353 Tinea pedis: Secondary | ICD-10-CM | POA: Diagnosis not present

## 2016-10-29 DIAGNOSIS — E538 Deficiency of other specified B group vitamins: Secondary | ICD-10-CM | POA: Diagnosis not present

## 2016-10-29 DIAGNOSIS — D229 Melanocytic nevi, unspecified: Secondary | ICD-10-CM | POA: Diagnosis not present

## 2016-10-29 DIAGNOSIS — D492 Neoplasm of unspecified behavior of bone, soft tissue, and skin: Secondary | ICD-10-CM | POA: Diagnosis not present

## 2016-10-29 DIAGNOSIS — B359 Dermatophytosis, unspecified: Secondary | ICD-10-CM | POA: Diagnosis not present

## 2016-11-06 ENCOUNTER — Other Ambulatory Visit: Payer: Self-pay | Admitting: Infectious Diseases

## 2016-11-06 DIAGNOSIS — B370 Candidal stomatitis: Secondary | ICD-10-CM

## 2016-12-24 DIAGNOSIS — C44319 Basal cell carcinoma of skin of other parts of face: Secondary | ICD-10-CM | POA: Diagnosis not present

## 2017-01-13 DIAGNOSIS — J069 Acute upper respiratory infection, unspecified: Secondary | ICD-10-CM | POA: Diagnosis not present

## 2017-01-15 DIAGNOSIS — E538 Deficiency of other specified B group vitamins: Secondary | ICD-10-CM | POA: Diagnosis not present

## 2017-03-23 DIAGNOSIS — E538 Deficiency of other specified B group vitamins: Secondary | ICD-10-CM | POA: Diagnosis not present

## 2017-04-08 DIAGNOSIS — M71571 Other bursitis, not elsewhere classified, right ankle and foot: Secondary | ICD-10-CM | POA: Diagnosis not present

## 2017-04-08 DIAGNOSIS — E538 Deficiency of other specified B group vitamins: Secondary | ICD-10-CM | POA: Diagnosis not present

## 2017-04-08 DIAGNOSIS — M7731 Calcaneal spur, right foot: Secondary | ICD-10-CM | POA: Diagnosis not present

## 2017-04-08 DIAGNOSIS — M722 Plantar fascial fibromatosis: Secondary | ICD-10-CM | POA: Diagnosis not present

## 2017-04-15 DIAGNOSIS — M71571 Other bursitis, not elsewhere classified, right ankle and foot: Secondary | ICD-10-CM | POA: Diagnosis not present

## 2017-04-15 DIAGNOSIS — M7731 Calcaneal spur, right foot: Secondary | ICD-10-CM | POA: Diagnosis not present

## 2017-04-15 DIAGNOSIS — M722 Plantar fascial fibromatosis: Secondary | ICD-10-CM | POA: Diagnosis not present

## 2017-04-29 DIAGNOSIS — Z23 Encounter for immunization: Secondary | ICD-10-CM | POA: Diagnosis not present

## 2017-06-22 DIAGNOSIS — E538 Deficiency of other specified B group vitamins: Secondary | ICD-10-CM | POA: Diagnosis not present

## 2017-08-19 DIAGNOSIS — E538 Deficiency of other specified B group vitamins: Secondary | ICD-10-CM | POA: Diagnosis not present

## 2017-10-07 DIAGNOSIS — E538 Deficiency of other specified B group vitamins: Secondary | ICD-10-CM | POA: Diagnosis not present

## 2017-11-11 DIAGNOSIS — J449 Chronic obstructive pulmonary disease, unspecified: Secondary | ICD-10-CM | POA: Diagnosis not present

## 2017-11-11 DIAGNOSIS — N318 Other neuromuscular dysfunction of bladder: Secondary | ICD-10-CM | POA: Diagnosis not present

## 2017-11-11 DIAGNOSIS — E538 Deficiency of other specified B group vitamins: Secondary | ICD-10-CM | POA: Diagnosis not present

## 2017-11-14 ENCOUNTER — Other Ambulatory Visit: Payer: Self-pay | Admitting: Infectious Diseases

## 2017-11-14 DIAGNOSIS — B370 Candidal stomatitis: Secondary | ICD-10-CM

## 2017-11-16 ENCOUNTER — Telehealth: Payer: Self-pay | Admitting: *Deleted

## 2017-11-16 ENCOUNTER — Other Ambulatory Visit: Payer: Self-pay | Admitting: Infectious Diseases

## 2017-11-16 DIAGNOSIS — B370 Candidal stomatitis: Secondary | ICD-10-CM

## 2017-11-16 NOTE — Telephone Encounter (Signed)
Patient walked in to clinic, asked for refill of his nystatin swish. Last written 10/2016 for 1 year.  Refilled, please cosign. Landis Gandy, RN

## 2017-12-07 ENCOUNTER — Other Ambulatory Visit: Payer: Self-pay | Admitting: Infectious Diseases

## 2017-12-07 DIAGNOSIS — B370 Candidal stomatitis: Secondary | ICD-10-CM

## 2017-12-16 DIAGNOSIS — K219 Gastro-esophageal reflux disease without esophagitis: Secondary | ICD-10-CM | POA: Diagnosis not present

## 2017-12-18 ENCOUNTER — Other Ambulatory Visit: Payer: Self-pay | Admitting: Infectious Diseases

## 2017-12-18 DIAGNOSIS — B370 Candidal stomatitis: Secondary | ICD-10-CM

## 2017-12-23 DIAGNOSIS — M791 Myalgia, unspecified site: Secondary | ICD-10-CM | POA: Diagnosis not present

## 2017-12-28 DIAGNOSIS — E538 Deficiency of other specified B group vitamins: Secondary | ICD-10-CM | POA: Diagnosis not present

## 2017-12-31 DIAGNOSIS — N400 Enlarged prostate without lower urinary tract symptoms: Secondary | ICD-10-CM | POA: Diagnosis not present

## 2017-12-31 DIAGNOSIS — N323 Diverticulum of bladder: Secondary | ICD-10-CM | POA: Diagnosis not present

## 2018-01-05 ENCOUNTER — Other Ambulatory Visit: Payer: Self-pay | Admitting: Dermatology

## 2018-01-05 DIAGNOSIS — L570 Actinic keratosis: Secondary | ICD-10-CM | POA: Diagnosis not present

## 2018-01-05 DIAGNOSIS — L929 Granulomatous disorder of the skin and subcutaneous tissue, unspecified: Secondary | ICD-10-CM | POA: Diagnosis not present

## 2018-01-05 DIAGNOSIS — D229 Melanocytic nevi, unspecified: Secondary | ICD-10-CM | POA: Diagnosis not present

## 2018-01-05 DIAGNOSIS — C44319 Basal cell carcinoma of skin of other parts of face: Secondary | ICD-10-CM | POA: Diagnosis not present

## 2018-01-05 DIAGNOSIS — D485 Neoplasm of uncertain behavior of skin: Secondary | ICD-10-CM | POA: Diagnosis not present

## 2018-01-06 DIAGNOSIS — R3914 Feeling of incomplete bladder emptying: Secondary | ICD-10-CM | POA: Diagnosis not present

## 2018-01-06 DIAGNOSIS — N400 Enlarged prostate without lower urinary tract symptoms: Secondary | ICD-10-CM | POA: Diagnosis not present

## 2018-01-06 DIAGNOSIS — N323 Diverticulum of bladder: Secondary | ICD-10-CM | POA: Diagnosis not present

## 2018-01-11 DIAGNOSIS — M25551 Pain in right hip: Secondary | ICD-10-CM | POA: Diagnosis not present

## 2018-01-11 DIAGNOSIS — M25562 Pain in left knee: Secondary | ICD-10-CM | POA: Diagnosis not present

## 2018-01-13 DIAGNOSIS — I2510 Atherosclerotic heart disease of native coronary artery without angina pectoris: Secondary | ICD-10-CM | POA: Diagnosis not present

## 2018-01-13 DIAGNOSIS — R3914 Feeling of incomplete bladder emptying: Secondary | ICD-10-CM | POA: Diagnosis not present

## 2018-01-13 DIAGNOSIS — E785 Hyperlipidemia, unspecified: Secondary | ICD-10-CM | POA: Diagnosis not present

## 2018-01-13 DIAGNOSIS — R7309 Other abnormal glucose: Secondary | ICD-10-CM | POA: Diagnosis not present

## 2018-01-13 DIAGNOSIS — N401 Enlarged prostate with lower urinary tract symptoms: Secondary | ICD-10-CM | POA: Diagnosis not present

## 2018-01-13 DIAGNOSIS — N183 Chronic kidney disease, stage 3 (moderate): Secondary | ICD-10-CM | POA: Diagnosis not present

## 2018-01-13 DIAGNOSIS — K219 Gastro-esophageal reflux disease without esophagitis: Secondary | ICD-10-CM | POA: Diagnosis not present

## 2018-01-13 DIAGNOSIS — I209 Angina pectoris, unspecified: Secondary | ICD-10-CM | POA: Diagnosis not present

## 2018-01-13 DIAGNOSIS — R351 Nocturia: Secondary | ICD-10-CM | POA: Diagnosis not present

## 2018-01-13 LAB — HEMOGLOBIN A1C: Hgb A1c MFr Bld: 5.6 (ref 4.0–6.0)

## 2018-01-13 LAB — LIPID PANEL
Cholesterol: 175 (ref 0–200)
HDL: 45 (ref 35–70)
LDL Cholesterol: 114
Triglycerides: 78 (ref 40–160)

## 2018-01-13 LAB — CBC AND DIFFERENTIAL
HEMATOCRIT: 44 (ref 41–53)
HEMOGLOBIN: 15.2 (ref 13.5–17.5)
PLATELETS: 162 (ref 150–399)
WBC: 6.3

## 2018-01-13 LAB — TSH: TSH: 4.89 (ref 0.41–5.90)

## 2018-01-29 DIAGNOSIS — E538 Deficiency of other specified B group vitamins: Secondary | ICD-10-CM | POA: Diagnosis not present

## 2018-02-03 DIAGNOSIS — I2510 Atherosclerotic heart disease of native coronary artery without angina pectoris: Secondary | ICD-10-CM | POA: Diagnosis not present

## 2018-02-03 DIAGNOSIS — L509 Urticaria, unspecified: Secondary | ICD-10-CM | POA: Diagnosis not present

## 2018-02-03 DIAGNOSIS — L239 Allergic contact dermatitis, unspecified cause: Secondary | ICD-10-CM | POA: Diagnosis not present

## 2018-02-03 LAB — HEPATIC FUNCTION PANEL
ALT: 19 (ref 10–40)
AST: 16 (ref 14–40)
Alkaline Phosphatase: 79 (ref 25–125)
BILIRUBIN, TOTAL: 1.5

## 2018-02-03 LAB — BASIC METABOLIC PANEL
BUN: 20 (ref 4–21)
Creatinine: 1.2 (ref 0.6–1.3)
GLUCOSE: 94
Potassium: 4.3 (ref 3.4–5.3)
Sodium: 144 (ref 137–147)

## 2018-02-03 LAB — CBC AND DIFFERENTIAL
HEMATOCRIT: 44 (ref 41–53)
Hemoglobin: 15.2 (ref 13.5–17.5)
Platelets: 162 (ref 150–399)
WBC: 6.3

## 2018-02-22 DIAGNOSIS — N400 Enlarged prostate without lower urinary tract symptoms: Secondary | ICD-10-CM | POA: Diagnosis not present

## 2018-02-23 ENCOUNTER — Other Ambulatory Visit: Payer: Self-pay | Admitting: Internal Medicine

## 2018-02-23 DIAGNOSIS — R410 Disorientation, unspecified: Secondary | ICD-10-CM

## 2018-02-23 DIAGNOSIS — F0390 Unspecified dementia without behavioral disturbance: Secondary | ICD-10-CM | POA: Diagnosis not present

## 2018-02-23 DIAGNOSIS — Z0000 Encounter for general adult medical examination without abnormal findings: Secondary | ICD-10-CM | POA: Diagnosis not present

## 2018-02-23 DIAGNOSIS — E538 Deficiency of other specified B group vitamins: Secondary | ICD-10-CM | POA: Diagnosis not present

## 2018-02-23 DIAGNOSIS — N183 Chronic kidney disease, stage 3 (moderate): Secondary | ICD-10-CM | POA: Diagnosis not present

## 2018-02-23 DIAGNOSIS — I2510 Atherosclerotic heart disease of native coronary artery without angina pectoris: Secondary | ICD-10-CM | POA: Diagnosis not present

## 2018-02-25 ENCOUNTER — Ambulatory Visit
Admission: RE | Admit: 2018-02-25 | Discharge: 2018-02-25 | Disposition: A | Discharge status: Home / Self Care | Payer: Medicare Other | Source: Ambulatory Visit | Attending: Internal Medicine | Admitting: Internal Medicine

## 2018-02-25 DIAGNOSIS — F0390 Unspecified dementia without behavioral disturbance: Secondary | ICD-10-CM

## 2018-02-25 DIAGNOSIS — R410 Disorientation, unspecified: Secondary | ICD-10-CM

## 2018-03-03 DIAGNOSIS — B351 Tinea unguium: Secondary | ICD-10-CM | POA: Diagnosis not present

## 2018-03-03 DIAGNOSIS — L239 Allergic contact dermatitis, unspecified cause: Secondary | ICD-10-CM | POA: Diagnosis not present

## 2018-03-11 DIAGNOSIS — C4491 Basal cell carcinoma of skin, unspecified: Secondary | ICD-10-CM

## 2018-03-11 DIAGNOSIS — R5383 Other fatigue: Secondary | ICD-10-CM | POA: Diagnosis not present

## 2018-03-11 HISTORY — PX: BASAL CELL CARCINOMA EXCISION: SHX1214

## 2018-03-11 HISTORY — DX: Basal cell carcinoma of skin, unspecified: C44.91

## 2018-03-11 LAB — VITAMIN B12: Vitamin B-12: 278

## 2018-03-23 DIAGNOSIS — E538 Deficiency of other specified B group vitamins: Secondary | ICD-10-CM | POA: Diagnosis not present

## 2018-04-06 DIAGNOSIS — C441122 Basal cell carcinoma of skin of right lower eyelid, including canthus: Secondary | ICD-10-CM | POA: Diagnosis not present

## 2018-04-16 DIAGNOSIS — H01001 Unspecified blepharitis right upper eyelid: Secondary | ICD-10-CM | POA: Diagnosis not present

## 2018-04-16 DIAGNOSIS — H2511 Age-related nuclear cataract, right eye: Secondary | ICD-10-CM | POA: Diagnosis not present

## 2018-04-16 DIAGNOSIS — H04123 Dry eye syndrome of bilateral lacrimal glands: Secondary | ICD-10-CM | POA: Diagnosis not present

## 2018-04-16 DIAGNOSIS — H52203 Unspecified astigmatism, bilateral: Secondary | ICD-10-CM | POA: Diagnosis not present

## 2018-04-20 DIAGNOSIS — E538 Deficiency of other specified B group vitamins: Secondary | ICD-10-CM | POA: Diagnosis not present

## 2018-04-29 ENCOUNTER — Encounter: Payer: Self-pay | Admitting: Internal Medicine

## 2018-04-29 ENCOUNTER — Ambulatory Visit (INDEPENDENT_AMBULATORY_CARE_PROVIDER_SITE_OTHER): Payer: Medicare Other | Admitting: Internal Medicine

## 2018-04-29 VITALS — BP 138/80 | HR 67 | Temp 97.7°F | Ht 67.0 in | Wt 152.0 lb

## 2018-04-29 DIAGNOSIS — D510 Vitamin B12 deficiency anemia due to intrinsic factor deficiency: Secondary | ICD-10-CM | POA: Diagnosis not present

## 2018-04-29 DIAGNOSIS — Z86018 Personal history of other benign neoplasm: Secondary | ICD-10-CM | POA: Diagnosis not present

## 2018-04-29 DIAGNOSIS — C44319 Basal cell carcinoma of skin of other parts of face: Secondary | ICD-10-CM

## 2018-04-29 DIAGNOSIS — D692 Other nonthrombocytopenic purpura: Secondary | ICD-10-CM | POA: Diagnosis not present

## 2018-04-29 DIAGNOSIS — N401 Enlarged prostate with lower urinary tract symptoms: Secondary | ICD-10-CM | POA: Diagnosis not present

## 2018-04-29 DIAGNOSIS — F0390 Unspecified dementia without behavioral disturbance: Secondary | ICD-10-CM | POA: Diagnosis not present

## 2018-04-29 DIAGNOSIS — I10 Essential (primary) hypertension: Secondary | ICD-10-CM

## 2018-04-29 DIAGNOSIS — R3916 Straining to void: Secondary | ICD-10-CM

## 2018-04-29 DIAGNOSIS — K2100 Gastro-esophageal reflux disease with esophagitis, without bleeding: Secondary | ICD-10-CM

## 2018-04-29 DIAGNOSIS — I7300 Raynaud's syndrome without gangrene: Secondary | ICD-10-CM

## 2018-04-29 DIAGNOSIS — R634 Abnormal weight loss: Secondary | ICD-10-CM

## 2018-04-29 DIAGNOSIS — Z23 Encounter for immunization: Secondary | ICD-10-CM | POA: Diagnosis not present

## 2018-04-29 MED ORDER — TAMSULOSIN HCL 0.4 MG PO CAPS: 0.4000 mg | ORAL_CAPSULE | Freq: Every day | ORAL | 3 refills | Status: DC

## 2018-04-29 NOTE — Patient Instructions (Signed)
Be sure to drink your ensure daily. Try to increase the amount of food you are eating to gain weight.  We will give you monthly B12 injections and recheck your B12 level in 3 months.
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
## 2018-05-03 ENCOUNTER — Encounter: Payer: Self-pay | Admitting: Internal Medicine

## 2018-05-10 ENCOUNTER — Encounter: Payer: Self-pay | Admitting: Internal Medicine

## 2018-05-21 ENCOUNTER — Ambulatory Visit (INDEPENDENT_AMBULATORY_CARE_PROVIDER_SITE_OTHER): Payer: Medicare Other

## 2018-05-21 DIAGNOSIS — D510 Vitamin B12 deficiency anemia due to intrinsic factor deficiency: Secondary | ICD-10-CM | POA: Diagnosis not present

## 2018-05-21 MED ORDER — CYANOCOBALAMIN 1000 MCG/ML IJ SOLN
1000.0000 ug | Freq: Once | INTRAMUSCULAR | Status: AC
  Administered 2018-05-21: 1000 ug via INTRAMUSCULAR

## 2018-05-27 ENCOUNTER — Telehealth: Payer: Self-pay

## 2018-05-27 NOTE — Telephone Encounter (Signed)
Patient's daughter called indicating patient c/o of chest pain and she thinks it is related to acid reflux  1. Where is the pain located? Upper center chest area  2. Does the pain radiate? (left arm or jaw) No  3. Any associated symptoms like shortness of breath,sweating,indigestion, or anxiety? No   4. If patient with history of myocardial infarction/coronary artery disease does the pain feel like your previous episode/heart attack? No   5. How long have you had the pain? Couple of months, patient with memory concerns and it is hard to obtain accuracy with when symptoms really started.  Scheduled appointment with Sherrie Mustache, NP for tomorrow.    I will forward your responses to your provider and call you with instructions, if your symptoms persist or progress call 911 to seek immediate medical attention.

## 2018-05-27 NOTE — Telephone Encounter (Signed)
Noted.  Glad he will be seen about this.  Thanks.

## 2018-05-28 ENCOUNTER — Ambulatory Visit: Payer: Self-pay | Admitting: Nurse Practitioner

## 2018-06-01 ENCOUNTER — Encounter: Payer: Self-pay | Admitting: Nurse Practitioner

## 2018-06-01 ENCOUNTER — Ambulatory Visit (INDEPENDENT_AMBULATORY_CARE_PROVIDER_SITE_OTHER): Payer: Medicare Other | Admitting: Nurse Practitioner

## 2018-06-01 VITALS — BP 148/82 | HR 62 | Temp 98.7°F | Ht 67.0 in | Wt 157.4 lb

## 2018-06-01 DIAGNOSIS — K2100 Gastro-esophageal reflux disease with esophagitis, without bleeding: Secondary | ICD-10-CM

## 2018-06-01 MED ORDER — ZOSTER VAC RECOMB ADJUVANTED 50 MCG/0.5ML IM SUSR: 0.5000 mL | Freq: Once | INTRAMUSCULAR | 1 refills | Status: AC

## 2018-06-01 NOTE — Patient Instructions (Addendum)
  Work on diet modifications Stop zantac Can use TUM as needed    Food Choices for Gastroesophageal Reflux Disease, Adult When you have gastroesophageal reflux disease (GERD), the foods you eat and your eating habits are very important. Choosing the right foods can help ease your discomfort. What guidelines do I need to follow?  Choose fruits, vegetables, whole grains, and low-fat dairy products.  Choose low-fat meat, fish, and poultry.  Limit fats such as oils, salad dressings, butter, nuts, and avocado.  Keep a food diary. This helps you identify foods that cause symptoms.  Avoid foods that cause symptoms. These may be different for everyone.  Eat small meals often instead of 3 large meals a day.  Eat your meals slowly, in a place where you are relaxed.  Limit fried foods.  Cook foods using methods other than frying.  Avoid drinking alcohol.  Avoid drinking large amounts of liquids with your meals.  Avoid bending over or lying down until 2-3 hours after eating. What foods are not recommended? These are some foods and drinks that may make your symptoms worse: Vegetables Tomatoes. Tomato juice. Tomato and spaghetti sauce. Chili peppers. Onion and garlic. Horseradish. Fruits Oranges, grapefruit, and lemon (fruit and juice). Meats High-fat meats, fish, and poultry. This includes hot dogs, ribs, ham, sausage, salami, and bacon. Dairy Whole milk and chocolate milk. Sour cream. Cream. Butter. Ice cream. Cream cheese. Drinks Coffee and tea. Bubbly (carbonated) drinks or energy drinks. Condiments Hot sauce. Barbecue sauce. Sweets/Desserts Chocolate and cocoa. Donuts. Peppermint and spearmint. Fats and Oils High-fat foods. This includes [place] fries and potato chips. Other Vinegar. Strong spices. This includes black pepper, white pepper, red pepper, cayenne, curry powder, cloves, ginger, and chili powder. The items listed above may not be a complete list of foods and  drinks to avoid. Contact your dietitian for more information. This information is not intended to replace advice given to you by your health care provider. Make sure you discuss any questions you have with your health care provider. Document Released: 01/27/2012 Document Revised: 01/03/2016 Document Reviewed: 06/01/2013 Elsevier Interactive Patient Education  2017 [employer].
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
## 2018-06-23 ENCOUNTER — Ambulatory Visit (INDEPENDENT_AMBULATORY_CARE_PROVIDER_SITE_OTHER): Payer: Medicare Other | Admitting: *Deleted

## 2018-06-23 DIAGNOSIS — D510 Vitamin B12 deficiency anemia due to intrinsic factor deficiency: Secondary | ICD-10-CM

## 2018-06-23 MED ORDER — CYANOCOBALAMIN 1000 MCG/ML IJ SOLN
1000.0000 ug | Freq: Once | INTRAMUSCULAR | Status: AC
  Administered 2018-06-23: 1000 ug via INTRAMUSCULAR

## 2018-07-14 DIAGNOSIS — M79644 Pain in right finger(s): Secondary | ICD-10-CM | POA: Diagnosis not present

## 2018-07-28 ENCOUNTER — Ambulatory Visit (INDEPENDENT_AMBULATORY_CARE_PROVIDER_SITE_OTHER): Payer: Medicare Other | Admitting: Nurse Practitioner

## 2018-07-28 ENCOUNTER — Encounter: Payer: Self-pay | Admitting: Nurse Practitioner

## 2018-07-28 ENCOUNTER — Ambulatory Visit
Admission: RE | Admit: 2018-07-28 | Discharge: 2018-07-28 | Disposition: A | Discharge status: Home / Self Care | Payer: Medicare Other | Source: Ambulatory Visit | Attending: Nurse Practitioner | Admitting: Nurse Practitioner

## 2018-07-28 VITALS — BP 130/84 | HR 83 | Temp 98.1°F | Ht 67.0 in | Wt 153.6 lb

## 2018-07-28 DIAGNOSIS — R634 Abnormal weight loss: Secondary | ICD-10-CM

## 2018-07-28 DIAGNOSIS — R05 Cough: Secondary | ICD-10-CM | POA: Diagnosis not present

## 2018-07-28 DIAGNOSIS — F0390 Unspecified dementia without behavioral disturbance: Secondary | ICD-10-CM | POA: Diagnosis not present

## 2018-07-28 DIAGNOSIS — D510 Vitamin B12 deficiency anemia due to intrinsic factor deficiency: Secondary | ICD-10-CM | POA: Diagnosis not present

## 2018-07-28 DIAGNOSIS — J069 Acute upper respiratory infection, unspecified: Secondary | ICD-10-CM

## 2018-07-28 DIAGNOSIS — R0981 Nasal congestion: Secondary | ICD-10-CM | POA: Diagnosis not present

## 2018-07-28 MED ORDER — AMOXICILLIN-POT CLAVULANATE 875-125 MG PO TABS: 1.0000 | ORAL_TABLET | Freq: Two times a day (BID) | ORAL | 0 refills | Status: DC

## 2018-07-28 NOTE — Patient Instructions (Signed)
Will get lab work today To go get chest xray when you leave here  neti pot daily if tolerates Start flonase 1 spray into both nares Plain nasal saline spray throughout the day as needed May use tylenol 325 mg 2 tablets every 6 hours as needed aches and pains or sore throat humidifier in the home to help with the dry air Mucinex DM by mouth twice daily as needed for cough and congestion with full glass of water  Keep well hydrated Avoid forcefully blowing nose Start augmentin 875-125 mg by mouth twice daily for 1 week   Keep follow up with Dr Mariea Clonts
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
## 2018-07-29 ENCOUNTER — Other Ambulatory Visit (HOSPITAL_COMMUNITY): Payer: Self-pay

## 2018-07-29 DIAGNOSIS — R1310 Dysphagia, unspecified: Secondary | ICD-10-CM

## 2018-07-29 LAB — CBC WITH DIFFERENTIAL/PLATELET
Absolute Monocytes: 740 cells/uL (ref 200–950)
Basophils Absolute: 94 cells/uL (ref 0–200)
Basophils Relative: 1.1 %
Eosinophils Absolute: 119 cells/uL (ref 15–500)
Eosinophils Relative: 1.4 %
HCT: 43.3 % (ref 38.5–50.0)
Hemoglobin: 15.3 g/dL (ref 13.2–17.1)
Lymphs Abs: 1029 cells/uL (ref 850–3900)
MCH: 33.6 pg — ABNORMAL HIGH (ref 27.0–33.0)
MCHC: 35.3 g/dL (ref 32.0–36.0)
MCV: 95.2 fL (ref 80.0–100.0)
MPV: 10.7 fL (ref 7.5–12.5)
Monocytes Relative: 8.7 %
Neutro Abs: 6520 cells/uL (ref 1500–7800)
Neutrophils Relative %: 76.7 %
Platelets: 217 10*3/uL (ref 140–400)
RBC: 4.55 10*6/uL (ref 4.20–5.80)
RDW: 12.6 % (ref 11.0–15.0)
Total Lymphocyte: 12.1 %
WBC: 8.5 10*3/uL (ref 3.8–10.8)

## 2018-07-29 LAB — COMPLETE METABOLIC PANEL WITH GFR
AG Ratio: 1.3 (calc) (ref 1.0–2.5)
ALT: 14 U/L (ref 9–46)
AST: 17 U/L (ref 10–35)
Albumin: 3.8 g/dL (ref 3.6–5.1)
Alkaline phosphatase (APISO): 74 U/L (ref 40–115)
BUN/Creatinine Ratio: 9 (calc) (ref 6–22)
BUN: 11 mg/dL (ref 7–25)
CO2: 30 mmol/L (ref 20–32)
Calcium: 9.2 mg/dL (ref 8.6–10.3)
Chloride: 100 mmol/L (ref 98–110)
Creat: 1.24 mg/dL — ABNORMAL HIGH (ref 0.70–1.18)
GFR, Est African American: 64 mL/min/{1.73_m2} (ref >=60)
GFR, Est Non African American: 55 mL/min/{1.73_m2} — ABNORMAL LOW (ref >=60)
Globulin: 3 g/dL (calc) (ref 1.9–3.7)
Glucose, Bld: 160 mg/dL — ABNORMAL HIGH (ref 65–99)
Potassium: 4.2 mmol/L (ref 3.5–5.3)
Sodium: 138 mmol/L (ref 135–146)
Total Bilirubin: 1.6 mg/dL — ABNORMAL HIGH (ref 0.2–1.2)
Total Protein: 6.8 g/dL (ref 6.1–8.1)

## 2018-07-29 LAB — VITAMIN B12: Vitamin B-12: 627 pg/mL (ref 200–1100)

## 2018-07-29 LAB — TSH: TSH: 2.19 mIU/L (ref 0.40–4.50)

## 2018-08-02 ENCOUNTER — Ambulatory Visit (INDEPENDENT_AMBULATORY_CARE_PROVIDER_SITE_OTHER): Payer: Medicare Other | Admitting: Internal Medicine

## 2018-08-02 ENCOUNTER — Encounter: Payer: Self-pay | Admitting: Internal Medicine

## 2018-08-02 VITALS — BP 120/70 | HR 70 | Ht 67.0 in | Wt 154.0 lb

## 2018-08-02 DIAGNOSIS — Z23 Encounter for immunization: Secondary | ICD-10-CM | POA: Diagnosis not present

## 2018-08-02 DIAGNOSIS — D510 Vitamin B12 deficiency anemia due to intrinsic factor deficiency: Secondary | ICD-10-CM

## 2018-08-02 DIAGNOSIS — J069 Acute upper respiratory infection, unspecified: Secondary | ICD-10-CM

## 2018-08-02 DIAGNOSIS — F0390 Unspecified dementia without behavioral disturbance: Secondary | ICD-10-CM

## 2018-08-02 DIAGNOSIS — R739 Hyperglycemia, unspecified: Secondary | ICD-10-CM

## 2018-08-02 DIAGNOSIS — H04123 Dry eye syndrome of bilateral lacrimal glands: Secondary | ICD-10-CM | POA: Diagnosis not present

## 2018-08-02 DIAGNOSIS — R1310 Dysphagia, unspecified: Secondary | ICD-10-CM

## 2018-08-02 DIAGNOSIS — D801 Nonfamilial hypogammaglobulinemia: Secondary | ICD-10-CM

## 2018-08-02 MED ORDER — DONEPEZIL HCL 5 MG PO TABS: 5.0000 mg | ORAL_TABLET | Freq: Every day | ORAL | 3 refills | Status: DC

## 2018-08-02 MED ORDER — CYANOCOBALAMIN 1000 MCG/ML IJ SOLN
1000.0000 ug | Freq: Once | INTRAMUSCULAR | Status: AC
  Administered 2018-08-02: 1000 ug via INTRAMUSCULAR
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
## 2018-08-02 NOTE — Patient Instructions (Addendum)
Please stop taking benadryl.  It makes you sleepy, unsteady and confused. Drink more water. Loosen up the mucus with the steam room at [facility] Use your eye drops  We gave you your pneumonia vaccine today and your B12 injection.  Merry Christmas!

## 2018-08-05 ENCOUNTER — Encounter: Payer: Self-pay | Admitting: *Deleted

## 2018-08-19 ENCOUNTER — Ambulatory Visit (HOSPITAL_COMMUNITY)
Admission: RE | Admit: 2018-08-19 | Discharge: 2018-08-19 | Disposition: A | Discharge status: Home / Self Care | Payer: Medicare Other | Source: Ambulatory Visit | Attending: Nurse Practitioner | Admitting: Nurse Practitioner

## 2018-08-19 ENCOUNTER — Encounter: Payer: Self-pay | Admitting: Internal Medicine

## 2018-08-19 DIAGNOSIS — J069 Acute upper respiratory infection, unspecified: Secondary | ICD-10-CM

## 2018-08-19 DIAGNOSIS — R1310 Dysphagia, unspecified: Secondary | ICD-10-CM | POA: Diagnosis not present

## 2018-08-19 DIAGNOSIS — K222 Esophageal obstruction: Secondary | ICD-10-CM | POA: Diagnosis not present

## 2018-08-19 DIAGNOSIS — T17200A Unspecified foreign body in pharynx causing asphyxiation, initial encounter: Secondary | ICD-10-CM | POA: Diagnosis not present

## 2018-09-03 ENCOUNTER — Ambulatory Visit (INDEPENDENT_AMBULATORY_CARE_PROVIDER_SITE_OTHER): Payer: Medicare Other | Admitting: *Deleted

## 2018-09-03 DIAGNOSIS — E538 Deficiency of other specified B group vitamins: Secondary | ICD-10-CM | POA: Diagnosis not present

## 2018-09-03 MED ORDER — CYANOCOBALAMIN 1000 MCG/ML IJ SOLN
1000.0000 ug | Freq: Once | INTRAMUSCULAR | Status: AC
  Administered 2018-09-03: 1000 ug via INTRAMUSCULAR

## 2018-09-14 DIAGNOSIS — R1310 Dysphagia, unspecified: Secondary | ICD-10-CM | POA: Diagnosis not present

## 2018-09-14 DIAGNOSIS — Z86010 Personal history of colonic polyps: Secondary | ICD-10-CM | POA: Diagnosis not present

## 2018-09-14 DIAGNOSIS — Z800 Family history of malignant neoplasm of digestive organs: Secondary | ICD-10-CM | POA: Diagnosis not present

## 2018-09-29 DIAGNOSIS — R1310 Dysphagia, unspecified: Secondary | ICD-10-CM | POA: Diagnosis not present

## 2018-09-29 DIAGNOSIS — B3781 Candidal esophagitis: Secondary | ICD-10-CM | POA: Diagnosis not present

## 2018-09-29 DIAGNOSIS — K222 Esophageal obstruction: Secondary | ICD-10-CM | POA: Diagnosis not present

## 2018-09-29 DIAGNOSIS — K449 Diaphragmatic hernia without obstruction or gangrene: Secondary | ICD-10-CM | POA: Diagnosis not present

## 2018-10-01 ENCOUNTER — Other Ambulatory Visit: Payer: Self-pay | Admitting: Infectious Diseases

## 2018-10-01 DIAGNOSIS — B370 Candidal stomatitis: Secondary | ICD-10-CM

## 2018-10-04 DIAGNOSIS — B3781 Candidal esophagitis: Secondary | ICD-10-CM | POA: Diagnosis not present

## 2018-10-05 ENCOUNTER — Ambulatory Visit (INDEPENDENT_AMBULATORY_CARE_PROVIDER_SITE_OTHER): Payer: Medicare Other

## 2018-10-05 DIAGNOSIS — E538 Deficiency of other specified B group vitamins: Secondary | ICD-10-CM | POA: Diagnosis not present

## 2018-10-05 MED ORDER — CYANOCOBALAMIN 1000 MCG/ML IJ SOLN
1000.0000 ug | Freq: Once | INTRAMUSCULAR | Status: AC
  Administered 2018-10-05: 1000 ug via INTRAMUSCULAR

## 2018-10-30 ENCOUNTER — Emergency Department (HOSPITAL_COMMUNITY)
Admission: EM | Admit: 2018-10-30 | Discharge: 2018-10-30 | Disposition: A | Discharge status: Home / Self Care | Payer: Medicare Other | Attending: Emergency Medicine | Admitting: Emergency Medicine

## 2018-10-30 ENCOUNTER — Other Ambulatory Visit: Payer: Self-pay

## 2018-10-30 ENCOUNTER — Emergency Department (HOSPITAL_COMMUNITY): Payer: Medicare Other

## 2018-10-30 ENCOUNTER — Ambulatory Visit: Admit: 2018-10-30 | Discharge status: Absent without leave | Payer: Medicare Other

## 2018-10-30 ENCOUNTER — Encounter (HOSPITAL_COMMUNITY): Payer: Self-pay

## 2018-10-30 DIAGNOSIS — R609 Edema, unspecified: Secondary | ICD-10-CM | POA: Diagnosis not present

## 2018-10-30 DIAGNOSIS — Z85828 Personal history of other malignant neoplasm of skin: Secondary | ICD-10-CM | POA: Diagnosis not present

## 2018-10-30 DIAGNOSIS — F0390 Unspecified dementia without behavioral disturbance: Secondary | ICD-10-CM | POA: Insufficient documentation

## 2018-10-30 DIAGNOSIS — R918 Other nonspecific abnormal finding of lung field: Secondary | ICD-10-CM | POA: Diagnosis not present

## 2018-10-30 DIAGNOSIS — Z79899 Other long term (current) drug therapy: Secondary | ICD-10-CM | POA: Diagnosis not present

## 2018-10-30 DIAGNOSIS — I10 Essential (primary) hypertension: Secondary | ICD-10-CM | POA: Diagnosis not present

## 2018-10-30 DIAGNOSIS — R911 Solitary pulmonary nodule: Secondary | ICD-10-CM | POA: Diagnosis not present

## 2018-10-30 DIAGNOSIS — D303 Benign neoplasm of bladder: Secondary | ICD-10-CM | POA: Insufficient documentation

## 2018-10-30 DIAGNOSIS — R2243 Localized swelling, mass and lump, lower limb, bilateral: Secondary | ICD-10-CM | POA: Diagnosis present

## 2018-10-30 LAB — CBC WITH DIFFERENTIAL/PLATELET
Abs Immature Granulocytes: 0.01 10*3/uL (ref 0.00–0.07)
Basophils Absolute: 0 10*3/uL (ref 0.0–0.1)
Basophils Relative: 1 %
Eosinophils Absolute: 0.1 10*3/uL (ref 0.0–0.5)
Eosinophils Relative: 1 %
HCT: 43.7 % (ref 39.0–52.0)
Hemoglobin: 14.2 g/dL (ref 13.0–17.0)
Immature Granulocytes: 0 %
LYMPHS ABS: 1.1 10*3/uL (ref 0.7–4.0)
Lymphocytes Relative: 24 %
MCH: 32.3 pg (ref 26.0–34.0)
MCHC: 32.5 g/dL (ref 30.0–36.0)
MCV: 99.5 fL (ref 80.0–100.0)
Monocytes Absolute: 0.5 10*3/uL (ref 0.1–1.0)
Monocytes Relative: 11 %
NRBC: 0 % (ref 0.0–0.2)
Neutro Abs: 2.8 10*3/uL (ref 1.7–7.7)
Neutrophils Relative %: 63 %
Platelets: 139 10*3/uL — ABNORMAL LOW (ref 150–400)
RBC: 4.39 MIL/uL (ref 4.22–5.81)
RDW: 13.9 % (ref 11.5–15.5)
WBC: 4.5 10*3/uL (ref 4.0–10.5)

## 2018-10-30 LAB — COMPREHENSIVE METABOLIC PANEL
ALK PHOS: 51 U/L (ref 38–126)
ALT: 18 U/L (ref 0–44)
AST: 23 U/L (ref 15–41)
Albumin: 3.5 g/dL (ref 3.5–5.0)
Anion gap: 6 (ref 5–15)
BUN: 18 mg/dL (ref 8–23)
CO2: 26 mmol/L (ref 22–32)
CREATININE: 1.07 mg/dL (ref 0.61–1.24)
Calcium: 8.4 mg/dL — ABNORMAL LOW (ref 8.9–10.3)
Chloride: 108 mmol/L (ref 98–111)
GFR calc Af Amer: >60 mL/min (ref >60)
Glucose, Bld: 89 mg/dL (ref 70–99)
Potassium: 3.9 mmol/L (ref 3.5–5.1)
Sodium: 140 mmol/L (ref 135–145)
Total Bilirubin: 1.9 mg/dL — ABNORMAL HIGH (ref 0.3–1.2)
Total Protein: 6.2 g/dL — ABNORMAL LOW (ref 6.5–8.1)
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
## 2018-10-30 NOTE — ED Notes (Signed)
ED Provider at bedside. 

## 2018-10-30 NOTE — Discharge Instructions (Addendum)
Try elevating your legs above your heart for 1 hour 3 times a day.  Make sure that you are staying on a low-sodium diet, to improve your leg swelling.  We are supplying some dietary ideas, attached to this form.  Your protein was somewhat lower than previously, continue your current protein supplementation.

## 2018-10-30 NOTE — ED Notes (Signed)
MD made aware of patient's BP at time of discharge.

## 2018-10-30 NOTE — ED Triage Notes (Signed)
Pt presents with bilateral leg swelling. No hx of CHF.   Thrush in esophagus, which is being treated by nystatin for several weeks.

## 2018-11-01 ENCOUNTER — Encounter: Payer: Self-pay | Admitting: Internal Medicine

## 2018-11-03 ENCOUNTER — Ambulatory Visit: Payer: Medicare Other

## 2018-11-03 ENCOUNTER — Encounter: Payer: Self-pay | Admitting: Nurse Practitioner

## 2018-11-03 ENCOUNTER — Other Ambulatory Visit: Payer: Self-pay

## 2018-11-03 ENCOUNTER — Ambulatory Visit (INDEPENDENT_AMBULATORY_CARE_PROVIDER_SITE_OTHER): Payer: Medicare Other | Admitting: Nurse Practitioner

## 2018-11-03 VITALS — BP 110/70 | HR 59 | Temp 97.8°F | Ht 67.0 in | Wt 164.0 lb

## 2018-11-03 DIAGNOSIS — R600 Localized edema: Secondary | ICD-10-CM | POA: Diagnosis not present

## 2018-11-03 DIAGNOSIS — E538 Deficiency of other specified B group vitamins: Secondary | ICD-10-CM
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
## 2018-11-03 NOTE — Patient Instructions (Signed)
Increase protein  Elevate legs above level of heart  Compression hose during the day.

## 2018-11-29 ENCOUNTER — Telehealth: Payer: Self-pay | Admitting: *Deleted

## 2018-11-29 ENCOUNTER — Ambulatory Visit (INDEPENDENT_AMBULATORY_CARE_PROVIDER_SITE_OTHER): Payer: Medicare Other | Admitting: *Deleted

## 2018-11-29 ENCOUNTER — Other Ambulatory Visit: Payer: Medicare Other

## 2018-11-29 ENCOUNTER — Other Ambulatory Visit: Payer: Self-pay

## 2018-11-29 DIAGNOSIS — R739 Hyperglycemia, unspecified: Secondary | ICD-10-CM | POA: Diagnosis not present

## 2018-11-29 DIAGNOSIS — E538 Deficiency of other specified B group vitamins: Secondary | ICD-10-CM | POA: Diagnosis not present

## 2018-11-29 DIAGNOSIS — J069 Acute upper respiratory infection, unspecified: Secondary | ICD-10-CM

## 2018-11-29 MED ORDER — CYANOCOBALAMIN 1000 MCG/ML IJ SOLN
1000.0000 ug | Freq: Once | INTRAMUSCULAR | Status: AC
  Administered 2018-11-29: 1000 ug via INTRAMUSCULAR

## 2018-11-29 NOTE — Telephone Encounter (Signed)
Spoke with daughter and advised results.  

## 2018-11-29 NOTE — Telephone Encounter (Signed)
I added the photo of his right forearm through haiku to his chart.  It looks like tinea corporis (ringword) to me.  This often comes from wet sweaty towels that are not immediately washed or from sharing gym equipment, etc.  I recommend lotrimin cream to be applied to the area per over-the-counter package instructions.  Please notify his daughter.

## 2018-11-29 NOTE — Telephone Encounter (Signed)
Pt was in today for a b-12 injection and wanted to ask about the rash on his right forearm (emailed picture to Dr. Mariea Clonts with patient's and son in-law permission)

## 2018-11-30 LAB — CBC WITH DIFFERENTIAL/PLATELET
Absolute Monocytes: 450 cells/uL (ref 200–950)
Basophils Absolute: 50 cells/uL (ref 0–200)
Basophils Relative: 1 %
Eosinophils Absolute: 80 cells/uL (ref 15–500)
Eosinophils Relative: 1.6 %
HCT: 43.9 % (ref 38.5–50.0)
Hemoglobin: 15.3 g/dL (ref 13.2–17.1)
Lymphs Abs: 1080 cells/uL (ref 850–3900)
MCH: 33 pg (ref 27.0–33.0)
MCHC: 34.9 g/dL (ref 32.0–36.0)
MCV: 94.6 fL (ref 80.0–100.0)
MPV: 11.2 fL (ref 7.5–12.5)
Monocytes Relative: 9 %
Neutro Abs: 3340 cells/uL (ref 1500–7800)
Neutrophils Relative %: 66.8 %
Platelets: 152 10*3/uL (ref 140–400)
RBC: 4.64 10*6/uL (ref 4.20–5.80)
RDW: 12.9 % (ref 11.0–15.0)
Total Lymphocyte: 21.6 %
WBC: 5 10*3/uL (ref 3.8–10.8)

## 2018-11-30 LAB — BASIC METABOLIC PANEL
BUN: 19 mg/dL (ref 7–25)
CO2: 28 mmol/L (ref 20–32)
Calcium: 8.9 mg/dL (ref 8.6–10.3)
Chloride: 103 mmol/L (ref 98–110)
Creat: 1.15 mg/dL (ref 0.70–1.18)
Glucose, Bld: 133 mg/dL — ABNORMAL HIGH (ref 65–99)
Potassium: 3.9 mmol/L (ref 3.5–5.3)
Sodium: 140 mmol/L (ref 135–146)

## 2018-11-30 LAB — HEMOGLOBIN A1C
Hgb A1c MFr Bld: 6 % of total Hgb — ABNORMAL HIGH (ref <5.7)
Mean Plasma Glucose: 126 (calc)
eAG (mmol/L): 7 (calc)

## 2018-12-09 ENCOUNTER — Ambulatory Visit (INDEPENDENT_AMBULATORY_CARE_PROVIDER_SITE_OTHER): Payer: Medicare Other | Admitting: Internal Medicine

## 2018-12-09 ENCOUNTER — Other Ambulatory Visit: Payer: Self-pay

## 2018-12-09 ENCOUNTER — Encounter: Payer: Self-pay | Admitting: Internal Medicine

## 2018-12-09 VITALS — BP 126/73 | HR 65 | Temp 98.6°F

## 2018-12-09 DIAGNOSIS — R739 Hyperglycemia, unspecified: Secondary | ICD-10-CM | POA: Insufficient documentation

## 2018-12-09 DIAGNOSIS — F0390 Unspecified dementia without behavioral disturbance: Secondary | ICD-10-CM | POA: Diagnosis not present

## 2018-12-09 DIAGNOSIS — R600 Localized edema: Secondary | ICD-10-CM | POA: Diagnosis not present

## 2018-12-09 DIAGNOSIS — B3781 Candidal esophagitis: Secondary | ICD-10-CM | POA: Diagnosis not present

## 2018-12-09 DIAGNOSIS — B354 Tinea corporis: Secondary | ICD-10-CM | POA: Insufficient documentation

## 2018-12-09 DIAGNOSIS — D510 Vitamin B12 deficiency anemia due to intrinsic factor deficiency: Secondary | ICD-10-CM | POA: Diagnosis not present

## 2018-12-09 DIAGNOSIS — E538 Deficiency of other specified B group vitamins: Secondary | ICD-10-CM

## 2018-12-09 MED ORDER — OMEPRAZOLE 40 MG PO CPDR: 40.0000 mg | DELAYED_RELEASE_CAPSULE | Freq: Two times a day (BID) | ORAL | 1 refills | Status: DC

## 2018-12-09 MED ORDER — DONEPEZIL HCL 5 MG PO TABS: 5.0000 mg | ORAL_TABLET | Freq: Every day | ORAL | 1 refills | Status: DC

## 2018-12-09 NOTE — Progress Notes (Signed)
Patient ID: Andrew Osborne, male   DOB: 1941-05-20, 78 y.o.   MRN: [id number] This service is provided via telemedicine  No vital signs collected/recorded due to the encounter was a telemedicine visit.   Location of patient (ex: home, work):  OFFICE  Patient consents to a telephone visit:  YES  Location of the provider (ex: office, home):  OFFICE  Name of any referring provider:  DR [facility] Giovanny Dugal DO  Names of all persons participating in the telemedicine service and their role in the encounter:  Andrew Osborne, PATIENT, Andrew Osborne, DR Jonelle Sidle Brennley Curtice DO  Time spent on call:  4:08  Virtual Visit via Video Note  I connected with Andrew Osborne on 12/09/18 at  3:00 PM EDT by a video enabled telemedicine application and verified that I am speaking with the correct person using two identifiers.  Location: Patient: work Provider: office   I discussed the limitations of evaluation and management by telemedicine and the availability of in person appointments. The patient expressed understanding and agreed to proceed.  History of Present Illness: 78 yo male seen by video visit.  Swelling of legs is better.  He's still got some bad athlete's foot.  Edema has gone down.  He's got fungal nail.    He did play tennis once this week--practiced.  Not in their usual group schedule.    He has a little trouble with numbness in the feet.  No falls.    No pain.    No difficulty with indigestion.  Has a good appetite.    Mouthwash expired.  He had taken nystatin mouthwash for thrush, but it resolved.  (had been seen on EGD).    He has a little rash on the back of his wrist.  He has not applied the lotrimin cream.    Sugar has crept up to 6 from 5.6 vs June of last year.  Discussed not overdoing the sweet things.  He thinks he is eating well otherwise.  Appetite has picked up.    He is drinking water and has a bottle next to him.  Observations/Objective: Appears a bit disheveled,  but healthier in terms of weight Right forearm with small scaly circular area on it Bilateral feet with minimal swelling (just indentions from shoes), yellow thickened toenails, dry scaly redness of feet and between toes  Assessment and Plan: 1. Dementia without behavioral disturbance, unspecified dementia type ([place]) - he did not remember to use the lamisil cream we recommended for him when he came in for his labs - donepezil (ARICEPT) 5 MG tablet; Take 1 tablet (5 mg total) by mouth at bedtime.  Dispense: 90 tablet; Refill: 1  2. B12 deficiency -continue regular injections and monitor level (check before next visit)  3. Lower extremity edema -improved, avoid high sodium foods, elevate feet at rest  4. Pernicious anemia -f/u cbc, likely worsened by PPI but cannot go w/o; ? If thrush affected it to  5. Hyperglycemia -counseled on being careful about sweets intake since hba1c has trended up and staying active with his tennis playing as able amid covid  6. Esophageal thrush ([facility]) -completed nystatin and not having any further problems  7. Tinea corporis -use lamisil cream his Andrew got as we recommended twice a day to his right forearm  Follow Up Instructions:   Be careful with sweets intake.  Stay active.    Drink plenty of water 6-8 8oz glasses (total 64oz per day ideal).    Use lamisil  cream to right forearm twice a day until rash resolves.     I discussed the assessment and treatment plan with the patient. The patient was provided an opportunity to ask questions and all were answered. The patient agreed with the plan and demonstrated an understanding of the instructions.   The patient was advised to call back or seek an in-person evaluation if the symptoms worsen or if the condition fails to improve as anticipated.  I provided 27 minutes of non-face-to-face time during this encounter.  Jaray Boliver L. Quinnetta Roepke, D.O. [facility] [address], [place] [address] Cell Phone (Mon-Fri 8am-5pm):  [phone] On Call:  [phone] & follow prompts after 5pm & weekends Office Phone:  [phone] Office Fax:  [phone]

## 2018-12-09 NOTE — Patient Instructions (Signed)
Be careful with sweets intake.  Stay active.    Drink plenty of water 6-8 8oz glasses (total 64oz per day ideal).    Use lamisil cream to right forearm twice a day until rash resolves.

## 2018-12-09 NOTE — Telephone Encounter (Signed)
Patient called in and spoke with Karena Addison, [place] assistant

## 2018-12-30 ENCOUNTER — Other Ambulatory Visit: Payer: Self-pay

## 2018-12-30 ENCOUNTER — Ambulatory Visit (INDEPENDENT_AMBULATORY_CARE_PROVIDER_SITE_OTHER): Payer: Medicare Other | Admitting: *Deleted

## 2018-12-30 DIAGNOSIS — E538 Deficiency of other specified B group vitamins: Secondary | ICD-10-CM

## 2018-12-30 MED ORDER — CYANOCOBALAMIN 1000 MCG/ML IJ SOLN
1000.0000 ug | Freq: Once | INTRAMUSCULAR | Status: AC
  Administered 2018-12-30: 1000 ug via INTRAMUSCULAR

## 2019-01-31 ENCOUNTER — Ambulatory Visit: Payer: Medicare Other

## 2019-01-31 ENCOUNTER — Ambulatory Visit (INDEPENDENT_AMBULATORY_CARE_PROVIDER_SITE_OTHER): Payer: Medicare Other

## 2019-01-31 ENCOUNTER — Other Ambulatory Visit: Payer: Self-pay

## 2019-01-31 DIAGNOSIS — E538 Deficiency of other specified B group vitamins: Secondary | ICD-10-CM | POA: Diagnosis not present

## 2019-01-31 MED ORDER — CYANOCOBALAMIN 1000 MCG/ML IJ SOLN
1000.0000 ug | Freq: Once | INTRAMUSCULAR | Status: AC
  Administered 2019-01-31: 1000 ug via INTRAMUSCULAR

## 2019-02-09 DIAGNOSIS — B351 Tinea unguium: Secondary | ICD-10-CM | POA: Diagnosis not present

## 2019-02-09 DIAGNOSIS — B353 Tinea pedis: Secondary | ICD-10-CM | POA: Diagnosis not present

## 2019-02-28 ENCOUNTER — Ambulatory Visit: Payer: Medicare Other

## 2019-03-07 ENCOUNTER — Other Ambulatory Visit: Payer: Self-pay

## 2019-03-07 ENCOUNTER — Ambulatory Visit (INDEPENDENT_AMBULATORY_CARE_PROVIDER_SITE_OTHER): Payer: Medicare Other

## 2019-03-07 DIAGNOSIS — E538 Deficiency of other specified B group vitamins: Secondary | ICD-10-CM

## 2019-03-07 MED ORDER — CYANOCOBALAMIN 1000 MCG/ML IJ SOLN
1000.0000 ug | Freq: Once | INTRAMUSCULAR | Status: AC
  Administered 2019-03-07: 1000 ug via INTRAMUSCULAR

## 2019-03-29 ENCOUNTER — Other Ambulatory Visit: Payer: Self-pay | Admitting: Internal Medicine

## 2019-03-29 DIAGNOSIS — I10 Essential (primary) hypertension: Secondary | ICD-10-CM

## 2019-03-29 DIAGNOSIS — R634 Abnormal weight loss: Secondary | ICD-10-CM

## 2019-03-29 DIAGNOSIS — E538 Deficiency of other specified B group vitamins: Secondary | ICD-10-CM

## 2019-03-29 DIAGNOSIS — R739 Hyperglycemia, unspecified: Secondary | ICD-10-CM

## 2019-04-06 ENCOUNTER — Other Ambulatory Visit: Payer: Medicare Other

## 2019-04-06 ENCOUNTER — Other Ambulatory Visit: Payer: Self-pay

## 2019-04-06 DIAGNOSIS — I10 Essential (primary) hypertension: Secondary | ICD-10-CM | POA: Diagnosis not present

## 2019-04-06 DIAGNOSIS — R634 Abnormal weight loss: Secondary | ICD-10-CM

## 2019-04-06 DIAGNOSIS — R739 Hyperglycemia, unspecified: Secondary | ICD-10-CM | POA: Diagnosis not present

## 2019-04-06 DIAGNOSIS — E538 Deficiency of other specified B group vitamins: Secondary | ICD-10-CM

## 2019-04-07 LAB — CBC WITH DIFFERENTIAL/PLATELET
Absolute Monocytes: 496 cells/uL (ref 200–950)
Basophils Absolute: 29 cells/uL (ref 0–200)
Basophils Relative: 0.5 %
Eosinophils Absolute: 80 cells/uL (ref 15–500)
Eosinophils Relative: 1.4 %
HCT: 46.5 % (ref 38.5–50.0)
Hemoglobin: 15.8 g/dL (ref 13.2–17.1)
Lymphs Abs: 1112 cells/uL (ref 850–3900)
MCH: 32.8 pg (ref 27.0–33.0)
MCHC: 34 g/dL (ref 32.0–36.0)
MCV: 96.7 fL (ref 80.0–100.0)
MPV: 10.9 fL (ref 7.5–12.5)
Monocytes Relative: 8.7 %
Neutro Abs: 3984 cells/uL (ref 1500–7800)
Neutrophils Relative %: 69.9 %
Platelets: 177 10*3/uL (ref 140–400)
RBC: 4.81 10*6/uL (ref 4.20–5.80)
RDW: 13.3 % (ref 11.0–15.0)
Total Lymphocyte: 19.5 %
WBC: 5.7 10*3/uL (ref 3.8–10.8)

## 2019-04-07 LAB — LIPID PANEL
Cholesterol: 187 mg/dL (ref <200)
HDL: 49 mg/dL (ref >=40)
LDL Cholesterol (Calc): 116 mg/dL (calc) — ABNORMAL HIGH
Non-HDL Cholesterol (Calc): 138 mg/dL (calc) — ABNORMAL HIGH (ref <130)
Total CHOL/HDL Ratio: 3.8 (calc) (ref <5.0)
Triglycerides: 113 mg/dL (ref <150)

## 2019-04-07 LAB — BASIC METABOLIC PANEL WITH GFR
BUN/Creatinine Ratio: 17 (calc) (ref 6–22)
BUN: 23 mg/dL (ref 7–25)
CO2: 29 mmol/L (ref 20–32)
Calcium: 9.2 mg/dL (ref 8.6–10.3)
Chloride: 108 mmol/L (ref 98–110)
Creat: 1.32 mg/dL — ABNORMAL HIGH (ref 0.70–1.18)
GFR, Est African American: 59 mL/min/{1.73_m2} — ABNORMAL LOW (ref >=60)
GFR, Est Non African American: 51 mL/min/{1.73_m2} — ABNORMAL LOW (ref >=60)
Glucose, Bld: 134 mg/dL — ABNORMAL HIGH (ref 65–99)
Potassium: 4 mmol/L (ref 3.5–5.3)
Sodium: 144 mmol/L (ref 135–146)

## 2019-04-07 LAB — VITAMIN B12: Vitamin B-12: 320 pg/mL (ref 200–1100)

## 2019-04-07 LAB — HEMOGLOBIN A1C
Hgb A1c MFr Bld: 5.6 % of total Hgb (ref <5.7)
Mean Plasma Glucose: 114 (calc)
eAG (mmol/L): 6.3 (calc)

## 2019-04-11 ENCOUNTER — Encounter: Payer: Self-pay | Admitting: Internal Medicine

## 2019-04-11 ENCOUNTER — Ambulatory Visit (INDEPENDENT_AMBULATORY_CARE_PROVIDER_SITE_OTHER): Payer: Medicare Other | Admitting: Internal Medicine

## 2019-04-11 ENCOUNTER — Other Ambulatory Visit: Payer: Self-pay

## 2019-04-11 VITALS — BP 138/78 | HR 56 | Temp 98.3°F | Ht 67.0 in | Wt 162.0 lb

## 2019-04-11 DIAGNOSIS — Z23 Encounter for immunization: Secondary | ICD-10-CM

## 2019-04-11 DIAGNOSIS — R739 Hyperglycemia, unspecified: Secondary | ICD-10-CM

## 2019-04-11 DIAGNOSIS — D692 Other nonthrombocytopenic purpura: Secondary | ICD-10-CM

## 2019-04-11 DIAGNOSIS — I10 Essential (primary) hypertension: Secondary | ICD-10-CM

## 2019-04-11 DIAGNOSIS — D801 Nonfamilial hypogammaglobulinemia: Secondary | ICD-10-CM | POA: Diagnosis not present

## 2019-04-11 DIAGNOSIS — H04123 Dry eye syndrome of bilateral lacrimal glands: Secondary | ICD-10-CM | POA: Diagnosis not present

## 2019-04-11 DIAGNOSIS — E538 Deficiency of other specified B group vitamins: Secondary | ICD-10-CM | POA: Diagnosis not present

## 2019-04-11 MED ORDER — CYANOCOBALAMIN 1000 MCG/ML IJ SOLN
1000.0000 ug | Freq: Once | INTRAMUSCULAR | Status: AC
  Administered 2019-04-11: 1000 ug via INTRAMUSCULAR

## 2019-04-11 NOTE — Patient Instructions (Signed)
Please try to drink at least 48 oz of water daily. Try to increase foods with b12 in them in your diet.
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
## 2019-05-12 ENCOUNTER — Other Ambulatory Visit: Payer: Self-pay

## 2019-05-12 ENCOUNTER — Ambulatory Visit (INDEPENDENT_AMBULATORY_CARE_PROVIDER_SITE_OTHER): Payer: Medicare Other

## 2019-05-12 ENCOUNTER — Ambulatory Visit: Payer: Medicare Other

## 2019-05-12 DIAGNOSIS — E538 Deficiency of other specified B group vitamins: Secondary | ICD-10-CM | POA: Diagnosis not present

## 2019-05-12 MED ORDER — CYANOCOBALAMIN 1000 MCG/ML IJ SOLN
1000.0000 ug | Freq: Once | INTRAMUSCULAR | Status: AC
  Administered 2019-05-12: 1000 ug via INTRAMUSCULAR

## 2019-06-07 ENCOUNTER — Other Ambulatory Visit: Payer: Self-pay | Admitting: *Deleted

## 2019-06-07 DIAGNOSIS — N401 Enlarged prostate with lower urinary tract symptoms: Secondary | ICD-10-CM

## 2019-06-07 DIAGNOSIS — F0390 Unspecified dementia without behavioral disturbance: Secondary | ICD-10-CM

## 2019-06-07 MED ORDER — TAMSULOSIN HCL 0.4 MG PO CAPS: 0.4000 mg | ORAL_CAPSULE | Freq: Every day | ORAL | 1 refills | Status: DC

## 2019-06-07 MED ORDER — DONEPEZIL HCL 5 MG PO TABS: 5.0000 mg | ORAL_TABLET | Freq: Every day | ORAL | 1 refills | Status: DC

## 2019-06-07 NOTE — Addendum Note (Signed)
Addended by: Rafael Bihari A on: 06/07/2019 09:40 AM   Modules accepted: Orders

## 2019-06-07 NOTE — Telephone Encounter (Signed)
[employer] 

## 2019-06-20 ENCOUNTER — Other Ambulatory Visit: Payer: Self-pay

## 2019-06-20 ENCOUNTER — Ambulatory Visit: Payer: Medicare Other

## 2019-06-20 DIAGNOSIS — E538 Deficiency of other specified B group vitamins: Secondary | ICD-10-CM

## 2019-06-20 MED ORDER — CYANOCOBALAMIN 1000 MCG/ML IJ SOLN
1000.0000 ug | Freq: Once | INTRAMUSCULAR | Status: AC
  Administered 2019-06-20: 1000 ug via INTRAMUSCULAR

## 2019-07-15 ENCOUNTER — Other Ambulatory Visit: Payer: Self-pay

## 2019-07-15 ENCOUNTER — Other Ambulatory Visit: Payer: Medicare Other

## 2019-07-15 DIAGNOSIS — R739 Hyperglycemia, unspecified: Secondary | ICD-10-CM

## 2019-07-15 DIAGNOSIS — I10 Essential (primary) hypertension: Secondary | ICD-10-CM

## 2019-07-15 DIAGNOSIS — E538 Deficiency of other specified B group vitamins: Secondary | ICD-10-CM

## 2019-07-15 DIAGNOSIS — D801 Nonfamilial hypogammaglobulinemia: Secondary | ICD-10-CM

## 2019-07-16 LAB — CBC WITH DIFFERENTIAL/PLATELET
Absolute Monocytes: 473 cells/uL (ref 200–950)
Basophils Absolute: 60 cells/uL (ref 0–200)
Basophils Relative: 0.8 %
Eosinophils Absolute: 203 cells/uL (ref 15–500)
Eosinophils Relative: 2.7 %
HCT: 42.8 % (ref 38.5–50.0)
Hemoglobin: 14.7 g/dL (ref 13.2–17.1)
Lymphs Abs: 1125 cells/uL (ref 850–3900)
MCH: 33.6 pg — ABNORMAL HIGH (ref 27.0–33.0)
MCHC: 34.3 g/dL (ref 32.0–36.0)
MCV: 97.9 fL (ref 80.0–100.0)
MPV: 11.1 fL (ref 7.5–12.5)
Monocytes Relative: 6.3 %
Neutro Abs: 5640 cells/uL (ref 1500–7800)
Neutrophils Relative %: 75.2 %
Platelets: 150 10*3/uL (ref 140–400)
RBC: 4.37 10*6/uL (ref 4.20–5.80)
RDW: 13.1 % (ref 11.0–15.0)
Total Lymphocyte: 15 %
WBC: 7.5 10*3/uL (ref 3.8–10.8)

## 2019-07-16 LAB — COMPLETE METABOLIC PANEL WITH GFR
AG Ratio: 1.5 (calc) (ref 1.0–2.5)
ALT: 12 U/L (ref 9–46)
AST: 17 U/L (ref 10–35)
Albumin: 3.7 g/dL (ref 3.6–5.1)
Alkaline phosphatase (APISO): 60 U/L (ref 35–144)
BUN: 16 mg/dL (ref 7–25)
CO2: 26 mmol/L (ref 20–32)
Calcium: 8.7 mg/dL (ref 8.6–10.3)
Chloride: 105 mmol/L (ref 98–110)
Creat: 1.14 mg/dL (ref 0.70–1.18)
GFR, Est African American: 70 mL/min/{1.73_m2} (ref >=60)
GFR, Est Non African American: 61 mL/min/{1.73_m2} (ref >=60)
Globulin: 2.4 g/dL (calc) (ref 1.9–3.7)
Glucose, Bld: 148 mg/dL — ABNORMAL HIGH (ref 65–99)
Potassium: 4.2 mmol/L (ref 3.5–5.3)
Sodium: 140 mmol/L (ref 135–146)
Total Bilirubin: 1.2 mg/dL (ref 0.2–1.2)
Total Protein: 6.1 g/dL (ref 6.1–8.1)

## 2019-07-16 LAB — HEMOGLOBIN A1C
Hgb A1c MFr Bld: 5.7 % of total Hgb — ABNORMAL HIGH (ref <5.7)
Mean Plasma Glucose: 117 (calc)
eAG (mmol/L): 6.5 (calc)

## 2019-07-16 LAB — VITAMIN B12: Vitamin B-12: 437 pg/mL (ref 200–1100)

## 2019-07-21 ENCOUNTER — Ambulatory Visit (INDEPENDENT_AMBULATORY_CARE_PROVIDER_SITE_OTHER): Payer: Medicare Other | Admitting: Family

## 2019-07-21 ENCOUNTER — Ambulatory Visit: Payer: Medicare Other | Admitting: Internal Medicine

## 2019-07-21 ENCOUNTER — Other Ambulatory Visit: Payer: Self-pay

## 2019-07-21 ENCOUNTER — Encounter: Payer: Self-pay | Admitting: Family

## 2019-07-21 VITALS — BP 114/80 | HR 60 | Temp 97.7°F | Ht 67.0 in | Wt 158.8 lb

## 2019-07-21 DIAGNOSIS — R3916 Straining to void: Secondary | ICD-10-CM | POA: Diagnosis not present

## 2019-07-21 DIAGNOSIS — F0390 Unspecified dementia without behavioral disturbance: Secondary | ICD-10-CM

## 2019-07-21 DIAGNOSIS — N401 Enlarged prostate with lower urinary tract symptoms: Secondary | ICD-10-CM

## 2019-07-21 DIAGNOSIS — I10 Essential (primary) hypertension: Secondary | ICD-10-CM

## 2019-07-21 DIAGNOSIS — E538 Deficiency of other specified B group vitamins: Secondary | ICD-10-CM | POA: Diagnosis not present

## 2019-07-21 DIAGNOSIS — R7303 Prediabetes: Secondary | ICD-10-CM | POA: Insufficient documentation

## 2019-07-21 DIAGNOSIS — K2100 Gastro-esophageal reflux disease with esophagitis, without bleeding: Secondary | ICD-10-CM | POA: Diagnosis not present

## 2019-07-21 MED ORDER — DONEPEZIL HCL 5 MG PO TABS: 5.0000 mg | ORAL_TABLET | Freq: Every day | ORAL | 1 refills | Status: DC

## 2019-07-21 MED ORDER — OMEPRAZOLE 40 MG PO CPDR: 40.0000 mg | DELAYED_RELEASE_CAPSULE | Freq: Two times a day (BID) | ORAL | 1 refills | Status: DC

## 2019-07-21 MED ORDER — SYSTANE BALANCE 0.6 % OP SOLN: 1.0000 [drp] | Freq: Two times a day (BID) | OPHTHALMIC | 3 refills | Status: DC

## 2019-07-21 MED ORDER — TAMSULOSIN HCL 0.4 MG PO CAPS: 0.4000 mg | ORAL_CAPSULE | Freq: Every day | ORAL | 1 refills | Status: DC
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
## 2019-07-25 ENCOUNTER — Ambulatory Visit: Payer: Medicare Other

## 2019-08-01 ENCOUNTER — Other Ambulatory Visit: Payer: Self-pay

## 2019-08-01 ENCOUNTER — Ambulatory Visit (INDEPENDENT_AMBULATORY_CARE_PROVIDER_SITE_OTHER): Payer: Medicare Other

## 2019-08-01 DIAGNOSIS — E538 Deficiency of other specified B group vitamins: Secondary | ICD-10-CM | POA: Diagnosis not present

## 2019-08-01 MED ORDER — CYANOCOBALAMIN 1000 MCG/ML IJ SOLN
1000.0000 ug | Freq: Once | INTRAMUSCULAR | Status: AC
  Administered 2019-08-01: 1000 ug via INTRAMUSCULAR

## 2019-08-14 IMAGING — RF DG SWALLOWING FUNCTION
8 series · 19 of 24 positions shown · non-contrast
Comparison: None.

CLINICAL DATA: Dysphagia.  History of Barrett's esophagus

EXAM:
MODIFIED BARIUM SWALLOW
TECHNIQUE: Different consistencies of barium were administered orally to the
patient by the Speech Pathologist. Imaging of the pharynx was
performed in the lateral projection.
FLUOROSCOPY TIME:  Fluoroscopy Time:  1 minutes 20 seconds
Radiation Exposure Index (if provided by the fluoroscopic device):
Number of Acquired Spot Images: 0

[Series 1: cp_standard · 0.34mm/px · 2 of 62 frames shown (1 of 8)]
[frame 10/62]
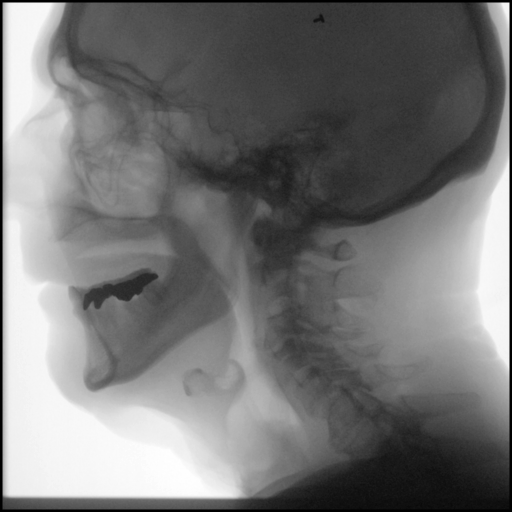
[frame 32/62]
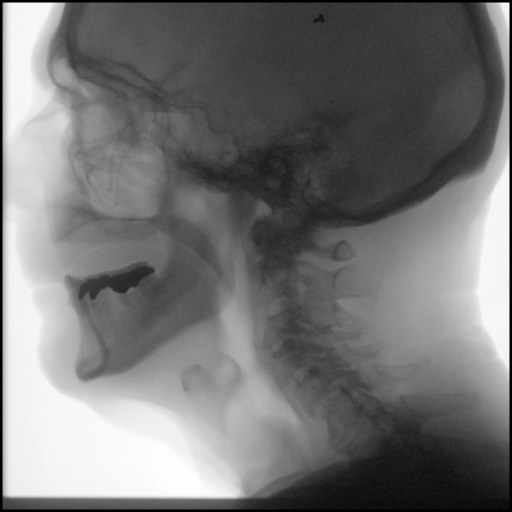

[Series 2: cp_standard · 0.34mm/px · 3 of 225 frames shown (2 of 8)]
[frame 34/225]
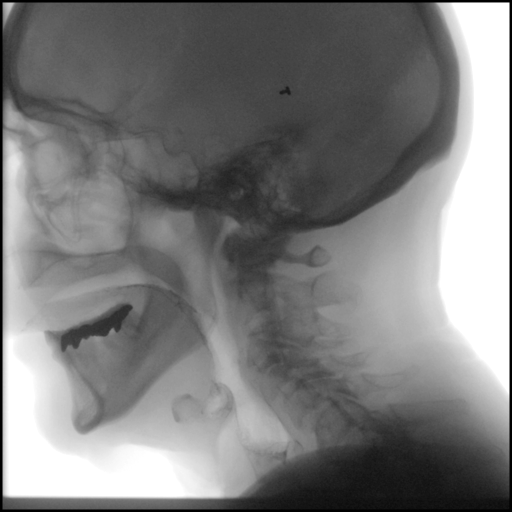
[frame 113/225]
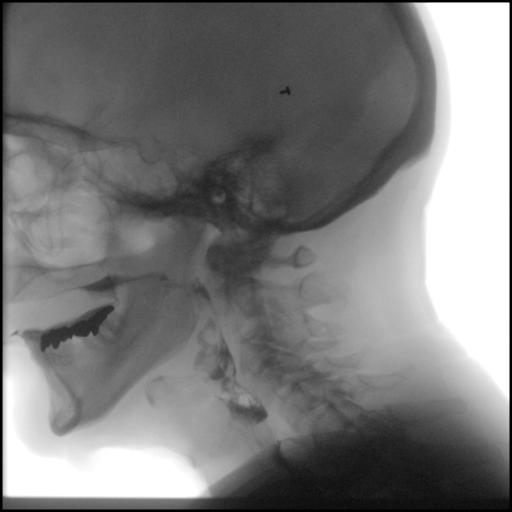
[frame 192/225]
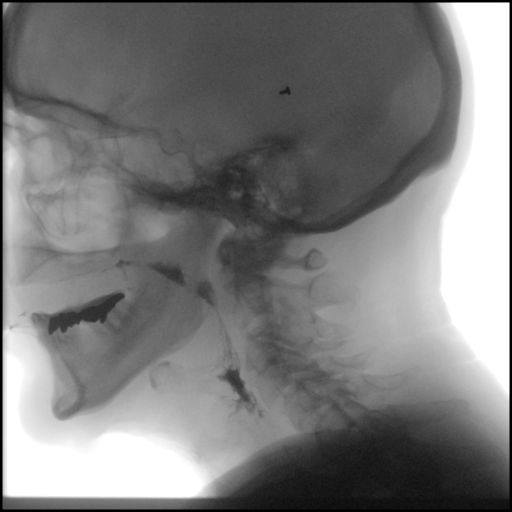

[Series 3: cp_standard · 0.34mm/px · 2 of 102 frames shown (3 of 8)]
[frame 15/102]
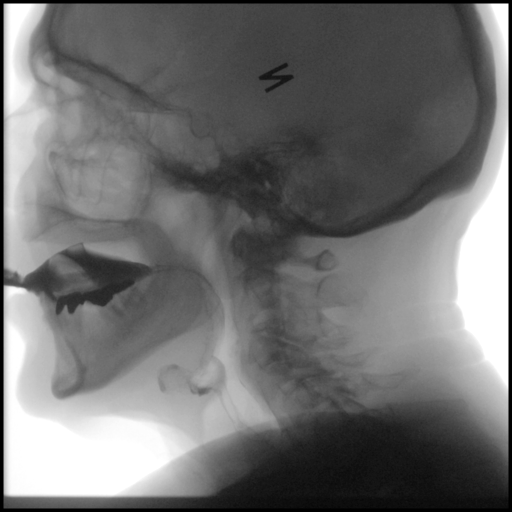
[frame 52/102]
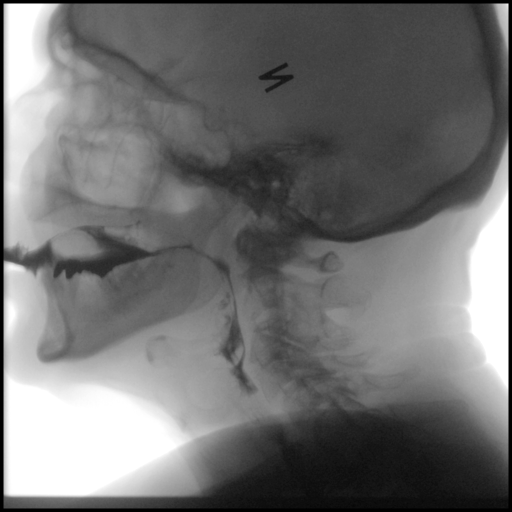

[Series 4: cp_standard · 0.34mm/px · 2 of 32 frames shown (4 of 8)]
[frame 5/32]
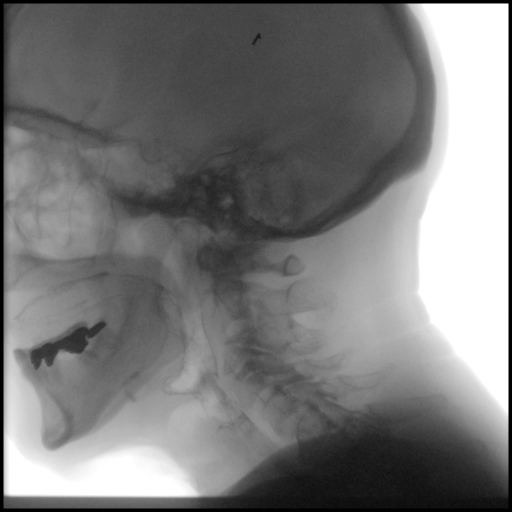
[frame 16/32]
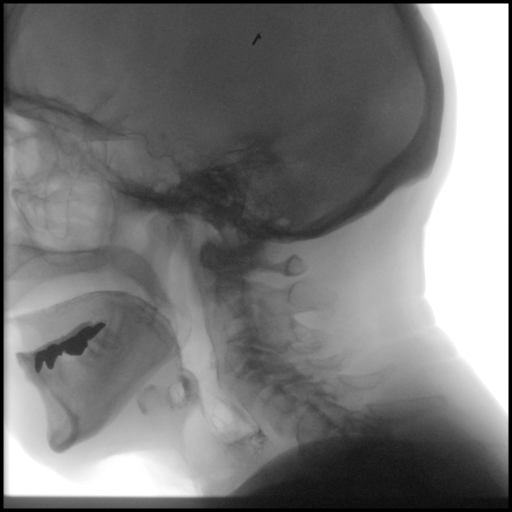

[Series 5: cp_standard · 0.34mm/px · 3 of 27 frames shown (5 of 8)]
[frame 5/27]
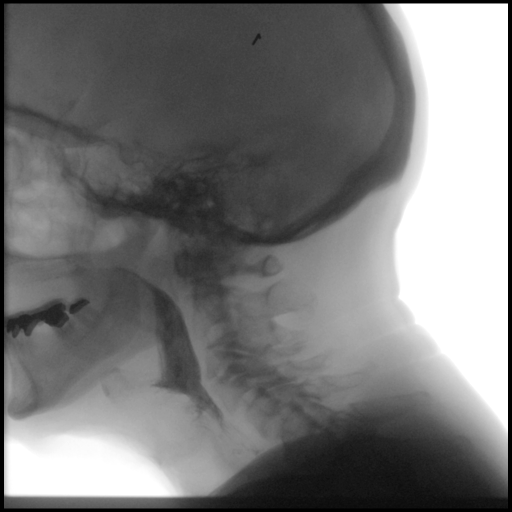
[frame 12/27]
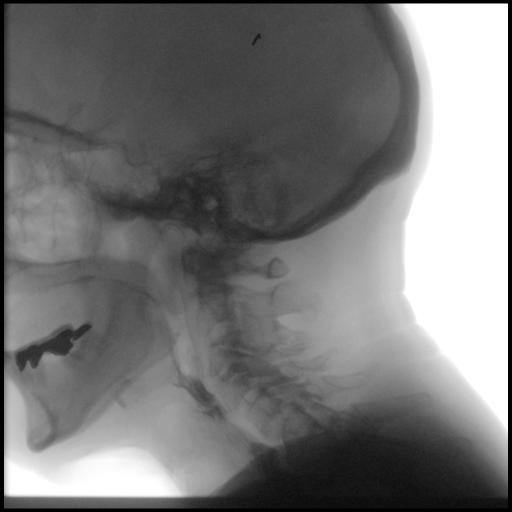
[frame 14/27]
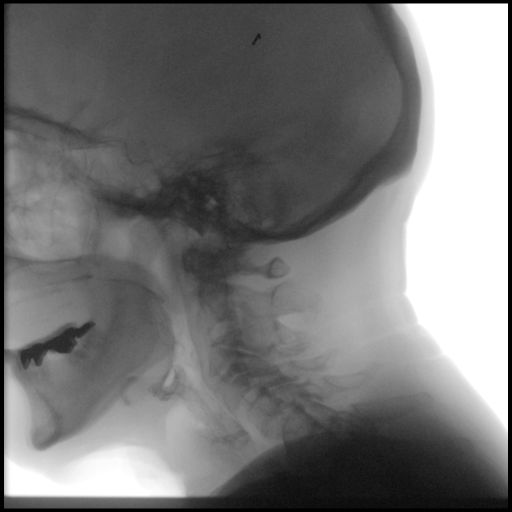

[Series 6: cp_standard · 0.34mm/px · 2 of 60 frames shown (6 of 8)]
[frame 10/60]
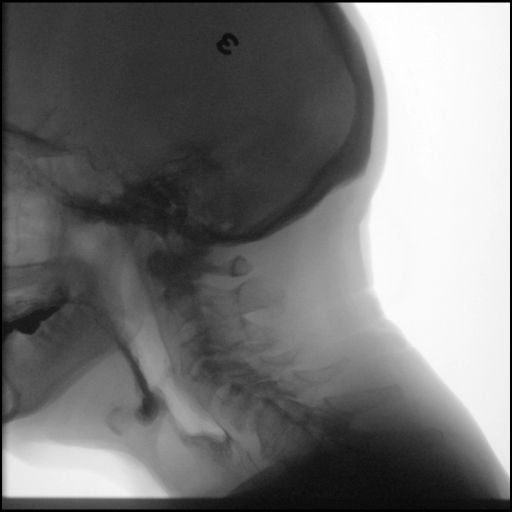
[frame 52/60]
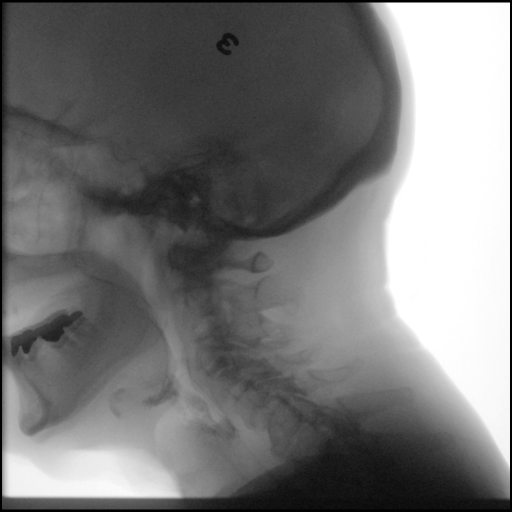

[Series 7: cp_standard · 0.34mm/px · 3 of 135 frames shown (7 of 8)]
[frame 1/135]
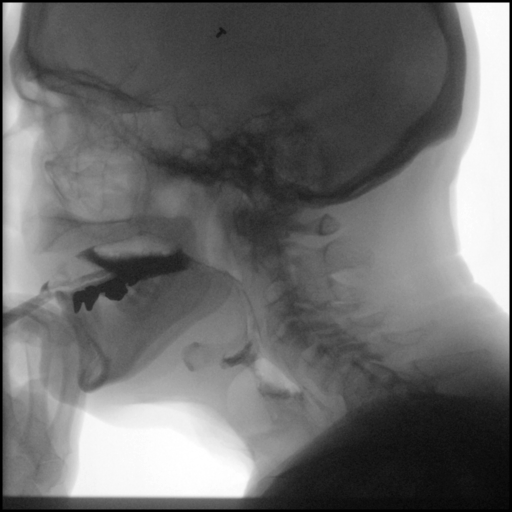
[frame 68/135]
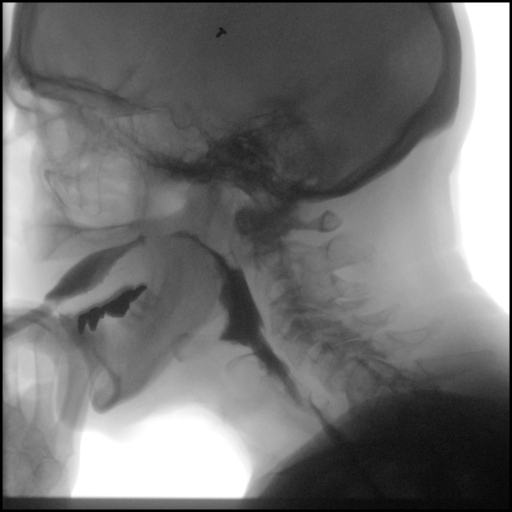
[frame 115/135]
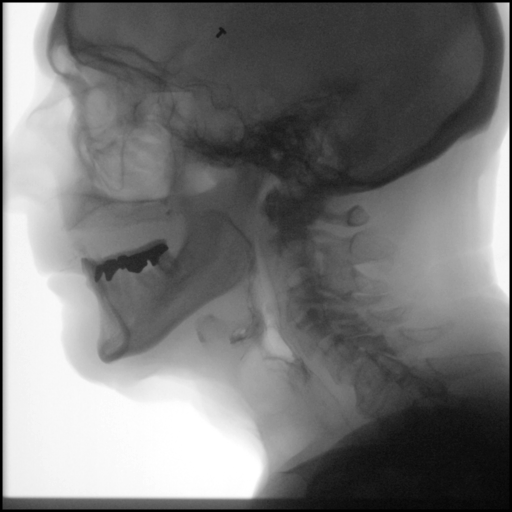

[Series 8: cp_standard · 0.34mm/px · 2 of 519 frames shown (8 of 8)]
[frame 260/519]
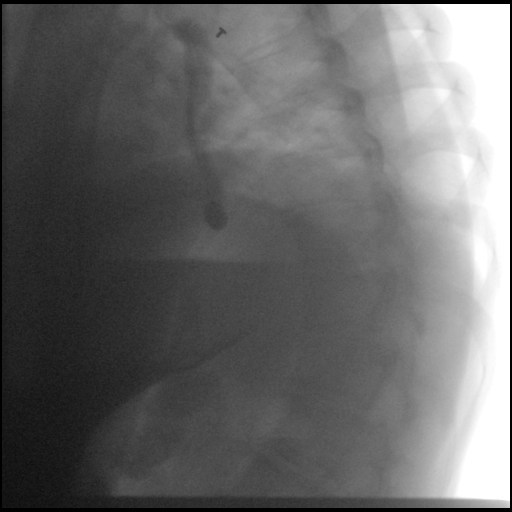
[frame 442/519]
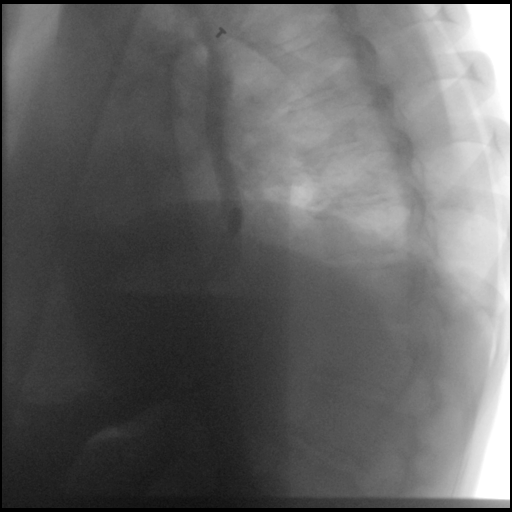

[19 of 24 positions shown; findings below may reference images not displayed]

FINDINGS: Thin liquid-flash penetration

Nectar thick liquid- within normal limits

Honey-not perform

Pure- within normal limits

Cracker-not perform

Pure with cracker- within normal limits

Barium tablet-barium tablet lodged in the distal esophagus and did
not pass into the stomach. Possible stricture. This is the site of
narrowing on prior esophagram 0952.
IMPRESSION: Flash penetration with thin liquid otherwise negative swallowing
function

Stricture distal esophagus. Review of prior esophagram 0952 reveals
a stricture in this area. Given history of Barrett's esophagus,
endoscopy recommended.

Please refer to the Speech Pathologists report for complete details
and recommendations.

## 2019-08-17 ENCOUNTER — Ambulatory Visit (INDEPENDENT_AMBULATORY_CARE_PROVIDER_SITE_OTHER): Payer: Medicare Other

## 2019-08-17 ENCOUNTER — Other Ambulatory Visit: Payer: Self-pay

## 2019-08-17 DIAGNOSIS — E538 Deficiency of other specified B group vitamins: Secondary | ICD-10-CM

## 2019-08-17 MED ORDER — CYANOCOBALAMIN 1000 MCG/ML IJ SOLN
1000.0000 ug | Freq: Once | INTRAMUSCULAR | Status: AC
  Administered 2019-08-17: 1000 ug via INTRAMUSCULAR

## 2019-09-03 ENCOUNTER — Other Ambulatory Visit: Payer: Self-pay | Admitting: Internal Medicine

## 2019-09-03 DIAGNOSIS — K2100 Gastro-esophageal reflux disease with esophagitis, without bleeding: Secondary | ICD-10-CM

## 2019-09-06 ENCOUNTER — Encounter: Payer: Self-pay | Admitting: Internal Medicine

## 2019-09-08 ENCOUNTER — Other Ambulatory Visit: Payer: Self-pay

## 2019-09-08 ENCOUNTER — Ambulatory Visit (INDEPENDENT_AMBULATORY_CARE_PROVIDER_SITE_OTHER): Payer: Medicare Other

## 2019-09-08 DIAGNOSIS — E538 Deficiency of other specified B group vitamins: Secondary | ICD-10-CM | POA: Diagnosis not present

## 2019-09-08 MED ORDER — CYANOCOBALAMIN 1000 MCG/ML IJ SOLN
1000.0000 ug | Freq: Once | INTRAMUSCULAR | Status: AC
  Administered 2019-09-08: 1000 ug via INTRAMUSCULAR

## 2019-10-02 ENCOUNTER — Ambulatory Visit: Payer: Medicare Other | Attending: Internal Medicine

## 2019-10-02 DIAGNOSIS — Z23 Encounter for immunization: Secondary | ICD-10-CM | POA: Insufficient documentation

## 2019-10-02 NOTE — Progress Notes (Signed)
   Covid-19 Vaccination Clinic  Name:  [name]    MRN: [id number] DOB: 1941-06-04  10/02/2019  [name] was observed post Covid-19 immunization for 15 minutes without incidence. He was provided with Vaccine Information Sheet and instruction to access the V-Safe system.   [name] was instructed to call 911 with any severe reactions post vaccine: Marland Kitchen Difficulty breathing  . Swelling of your face and throat  . A fast heartbeat  . A bad rash all over your body  . Dizziness and weakness    Immunizations Administered    Name Date Dose VIS Date Route   Pfizer COVID-19 Vaccine 10/02/2019  3:44 PM 0.3 mL 07/22/2019 Intramuscular   Manufacturer: [place]   Lot: [id number]   [place]: [id number]

## 2019-10-20 ENCOUNTER — Ambulatory Visit (INDEPENDENT_AMBULATORY_CARE_PROVIDER_SITE_OTHER): Payer: Medicare Other

## 2019-10-20 ENCOUNTER — Other Ambulatory Visit: Payer: Self-pay

## 2019-10-20 DIAGNOSIS — E538 Deficiency of other specified B group vitamins: Secondary | ICD-10-CM

## 2019-10-20 MED ORDER — CYANOCOBALAMIN 1000 MCG/ML IJ SOLN
1000.0000 ug | Freq: Once | INTRAMUSCULAR | Status: AC
  Administered 2019-10-20: 1000 ug via INTRAMUSCULAR

## 2019-10-26 ENCOUNTER — Ambulatory Visit: Payer: Medicare Other | Attending: Internal Medicine

## 2019-10-26 DIAGNOSIS — Z23 Encounter for immunization: Secondary | ICD-10-CM

## 2019-10-26 NOTE — Progress Notes (Signed)
   Covid-19 Vaccination Clinic  Name:  Andrew Osborne    MRN: [id number] DOB: 1940-10-02  10/26/2019  [name] was observed post Covid-19 immunization for 15 minutes without incident. He was provided with Vaccine Information Sheet and instruction to access the V-Safe system.   [name] was instructed to call 911 with any severe reactions post vaccine: Marland Kitchen Difficulty breathing  . Swelling of face and throat  . A fast heartbeat  . A bad rash all over body  . Dizziness and weakness   Immunizations Administered    Name Date Dose VIS Date Route   Pfizer COVID-19 Vaccine 10/26/2019  1:49 PM 0.3 mL 07/22/2019 Intramuscular   Manufacturer: [place]   Lot: [id number]   [place]: [id number]

## 2019-11-21 ENCOUNTER — Ambulatory Visit (INDEPENDENT_AMBULATORY_CARE_PROVIDER_SITE_OTHER): Payer: Medicare Other | Admitting: *Deleted

## 2019-11-21 ENCOUNTER — Ambulatory Visit: Payer: Self-pay

## 2019-11-21 ENCOUNTER — Other Ambulatory Visit: Payer: Self-pay

## 2019-11-21 DIAGNOSIS — E538 Deficiency of other specified B group vitamins: Secondary | ICD-10-CM

## 2019-11-21 MED ORDER — CYANOCOBALAMIN 1000 MCG/ML IJ SOLN
1000.0000 ug | Freq: Once | INTRAMUSCULAR | Status: AC
  Administered 2019-11-21: 1000 ug via INTRAMUSCULAR

## 2019-12-22 ENCOUNTER — Ambulatory Visit (INDEPENDENT_AMBULATORY_CARE_PROVIDER_SITE_OTHER): Payer: Medicare Other | Admitting: *Deleted

## 2019-12-22 ENCOUNTER — Other Ambulatory Visit: Payer: Self-pay

## 2019-12-22 DIAGNOSIS — E538 Deficiency of other specified B group vitamins: Secondary | ICD-10-CM | POA: Diagnosis not present

## 2019-12-22 MED ORDER — CYANOCOBALAMIN 1000 MCG/ML IJ SOLN
1000.0000 ug | Freq: Once | INTRAMUSCULAR | Status: AC
  Administered 2019-12-22: 1000 ug via INTRAMUSCULAR

## 2019-12-23 ENCOUNTER — Ambulatory Visit: Payer: Medicare Other

## 2019-12-28 ENCOUNTER — Encounter: Payer: Self-pay | Admitting: Internal Medicine

## 2019-12-29 ENCOUNTER — Telehealth: Payer: Self-pay | Admitting: *Deleted

## 2019-12-29 ENCOUNTER — Encounter (HOSPITAL_COMMUNITY): Payer: Self-pay | Admitting: *Deleted

## 2019-12-29 ENCOUNTER — Emergency Department (HOSPITAL_COMMUNITY)
Admission: EM | Admit: 2019-12-29 | Discharge: 2019-12-29 | Disposition: A | Discharge status: Home / Self Care | Payer: Medicare Other | Attending: Emergency Medicine | Admitting: Emergency Medicine

## 2019-12-29 DIAGNOSIS — I10 Essential (primary) hypertension: Secondary | ICD-10-CM | POA: Diagnosis not present

## 2019-12-29 DIAGNOSIS — Z79899 Other long term (current) drug therapy: Secondary | ICD-10-CM | POA: Insufficient documentation

## 2019-12-29 DIAGNOSIS — Z85828 Personal history of other malignant neoplasm of skin: Secondary | ICD-10-CM | POA: Diagnosis not present

## 2019-12-29 DIAGNOSIS — N390 Urinary tract infection, site not specified: Secondary | ICD-10-CM | POA: Diagnosis not present

## 2019-12-29 DIAGNOSIS — D303 Benign neoplasm of bladder: Secondary | ICD-10-CM | POA: Diagnosis not present

## 2019-12-29 DIAGNOSIS — F0390 Unspecified dementia without behavioral disturbance: Secondary | ICD-10-CM | POA: Diagnosis not present

## 2019-12-29 DIAGNOSIS — R319 Hematuria, unspecified: Secondary | ICD-10-CM | POA: Diagnosis present

## 2019-12-29 LAB — URINALYSIS, ROUTINE W REFLEX MICROSCOPIC
Bilirubin Urine: NEGATIVE
Glucose, UA: 100 mg/dL — AB
Ketones, ur: 15 mg/dL — AB
Nitrite: POSITIVE — AB
Protein, ur: >300 mg/dL — AB
Specific Gravity, Urine: 1.025 (ref 1.005–1.030)
pH: 6.5 (ref 5.0–8.0)

## 2019-12-29 LAB — COMPREHENSIVE METABOLIC PANEL
ALT: 15 U/L (ref 0–44)
AST: 21 U/L (ref 15–41)
Albumin: 4 g/dL (ref 3.5–5.0)
Alkaline Phosphatase: 61 U/L (ref 38–126)
Anion gap: 9 (ref 5–15)
BUN: 18 mg/dL (ref 8–23)
CO2: 25 mmol/L (ref 22–32)
Calcium: 9.1 mg/dL (ref 8.9–10.3)
Chloride: 104 mmol/L (ref 98–111)
Creatinine, Ser: 1.23 mg/dL (ref 0.61–1.24)
GFR calc Af Amer: >60 mL/min (ref >60)
GFR calc non Af Amer: 55 mL/min — ABNORMAL LOW (ref >60)
Glucose, Bld: 150 mg/dL — ABNORMAL HIGH (ref 70–99)
Potassium: 3.9 mmol/L (ref 3.5–5.1)
Sodium: 138 mmol/L (ref 135–145)
Total Bilirubin: 3.1 mg/dL — ABNORMAL HIGH (ref 0.3–1.2)
Total Protein: 7 g/dL (ref 6.5–8.1)

## 2019-12-29 LAB — CBC
HCT: 45.9 % (ref 39.0–52.0)
Hemoglobin: 15.6 g/dL (ref 13.0–17.0)
MCH: 33 pg (ref 26.0–34.0)
MCHC: 34 g/dL (ref 30.0–36.0)
MCV: 97 fL (ref 80.0–100.0)
Platelets: 141 10*3/uL — ABNORMAL LOW (ref 150–400)
RBC: 4.73 MIL/uL (ref 4.22–5.81)
RDW: 13.5 % (ref 11.5–15.5)
WBC: 6.2 10*3/uL (ref 4.0–10.5)
nRBC: 0 % (ref 0.0–0.2)

## 2019-12-29 LAB — URINALYSIS, MICROSCOPIC (REFLEX)
RBC / HPF: >50 RBC/hpf (ref 0–5)
Squamous Epithelial / HPF: NONE SEEN (ref 0–5)
WBC, UA: >50 WBC/hpf (ref 0–5)

## 2019-12-29 MED ORDER — PHENAZOPYRIDINE HCL 100 MG PO TABS
95.0000 mg | ORAL_TABLET | Freq: Once | ORAL | Status: AC
  Administered 2019-12-29: 100 mg via ORAL
  Filled 2019-12-29: qty 1

## 2019-12-29 MED ORDER — SODIUM CHLORIDE 0.9% FLUSH: 3.0000 mL | Freq: Once | INTRAVENOUS | Status: DC

## 2019-12-29 MED ORDER — SODIUM CHLORIDE 0.9 % IV BOLUS
1000.0000 mL | Freq: Once | INTRAVENOUS | Status: AC
  Administered 2019-12-29: 1000 mL via INTRAVENOUS

## 2019-12-29 MED ORDER — DOXYCYCLINE HYCLATE 100 MG PO CAPS
100.0000 mg | ORAL_CAPSULE | Freq: Two times a day (BID) | ORAL | 0 refills | Status: DC
Start: 2019-12-29 — End: 2020-01-19

## 2019-12-29 MED ORDER — SODIUM CHLORIDE 0.9 % IV SOLN
1.0000 g | Freq: Once | INTRAVENOUS | Status: AC
  Administered 2019-12-29: 1 g via INTRAVENOUS
  Filled 2019-12-29: qty 10

## 2019-12-29 NOTE — Discharge Instructions (Signed)
You have been diagnosed with a urinary tract infection.  Take antibiotic for the full duration and follow up with your doctor for recheck.  Return if you have any concerns.

## 2019-12-29 NOTE — Telephone Encounter (Signed)
I had called Andrew Osborne regarding her MyChart message and LMOM to return call.  Andrew Osborne, Caregiver called back from [employer] left. I had called Andrew Osborne and offered an appointment to be seen today with Dinah. Andrew Osborne stated that they are at the ER pulling in. I offered her appointment this morning with our office instead and she stated that she is sure that test will have to be ran and they rather go ahead and be seen at the ER and get the testing done today.     Andrew Osborne" to Gayland Curry, DO     6:35 PM This message is being sent by Andrew Osborne on behalf of Andrew Osborne.  Andrew Osborne . My Dad just told me that he has blood in his Urine. Do we go to ER or come see you tomorrow. We haven't been to a Urologist since he started seeing you.  Thank you for your time  Andrew Osborne [phone]

## 2019-12-29 NOTE — ED Triage Notes (Signed)
To ED for eval of hematuria. Started yesterday. Denies flank pain. Complains of lower abd pain - sometimes. No vomiting. No diarrhea.

## 2019-12-29 NOTE — ED Notes (Signed)
Patient verbalizes understanding of discharge instructions . Opportunity for questions and answers were provided . Armband removed by staff ,Pt discharged from ED. W/C  offered at D/C  and Declined W/C at D/C and was escorted to lobby by RN.  
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
## 2019-12-31 LAB — URINE CULTURE: Culture: >=100000 — AB

## 2020-01-01 ENCOUNTER — Telehealth: Payer: Self-pay | Admitting: Emergency Medicine

## 2020-01-01 NOTE — Telephone Encounter (Signed)
Post ED Visit - Positive Culture Follow-up  Culture report reviewed by antimicrobial stewardship pharmacist: [place] Team []  Elenor Quinones, Pharm.D. []  Heide Guile, Pharm.D., BCPS AQ-ID []  Parks Neptune, Pharm.D., BCPS []  Alycia Rossetti, Pharm.D., BCPS []  [place], Pharm.D., BCPS, AAHIVP []  Legrand Como, Pharm.D., BCPS, AAHIVP []  Salome Arnt, PharmD, BCPS []  Johnnette Gourd, PharmD, BCPS []  Hughes Better, PharmD, BCPS [x]  Duanne Limerick, PharmD []  Laqueta Linden, PharmD, BCPS []  Albertina Parr, PharmD  [place] Team []  Leodis Sias, PharmD []  Lindell Spar, PharmD []  Royetta Asal, PharmD []  Graylin Shiver, Rph []  Rema Fendt) Glennon Mac, PharmD []  Arlyn Dunning, PharmD []  Netta Cedars, PharmD []  Dia Sitter, PharmD []  Leone Haven, PharmD []  Gretta Arab, PharmD []  Theodis Shove, PharmD []  Peggyann Juba, PharmD []  Reuel Boom, PharmD   Positive urine culture Treated with Doxycycline, organism sensitive to the same and no further patient follow-up is required at this time.  Andrew Osborne 01/01/2020, 2:29 PM

## 2020-01-16 ENCOUNTER — Other Ambulatory Visit: Payer: Medicare Other

## 2020-01-16 ENCOUNTER — Other Ambulatory Visit: Payer: Self-pay

## 2020-01-16 DIAGNOSIS — R7303 Prediabetes: Secondary | ICD-10-CM | POA: Diagnosis not present

## 2020-01-16 DIAGNOSIS — E538 Deficiency of other specified B group vitamins: Secondary | ICD-10-CM

## 2020-01-16 DIAGNOSIS — I10 Essential (primary) hypertension: Secondary | ICD-10-CM

## 2020-01-17 ENCOUNTER — Telehealth: Payer: Self-pay

## 2020-01-17 LAB — LIPID PANEL
Cholesterol: 176 mg/dL (ref <200)
HDL: 45 mg/dL (ref >=40)
LDL Cholesterol (Calc): 103 mg/dL (calc) — ABNORMAL HIGH
Non-HDL Cholesterol (Calc): 131 mg/dL (calc) — ABNORMAL HIGH (ref <130)
Total CHOL/HDL Ratio: 3.9 (calc) (ref <5.0)
Triglycerides: 160 mg/dL — ABNORMAL HIGH (ref <150)

## 2020-01-17 LAB — HEMOGLOBIN A1C
Hgb A1c MFr Bld: 5.3 % of total Hgb (ref <5.7)
Mean Plasma Glucose: 105 (calc)
eAG (mmol/L): 5.8 (calc)

## 2020-01-17 LAB — COMPLETE METABOLIC PANEL WITH GFR
AG Ratio: 1.6 (calc) (ref 1.0–2.5)
ALT: 10 U/L (ref 9–46)
AST: 19 U/L (ref 10–35)
Albumin: 4.1 g/dL (ref 3.6–5.1)
Alkaline phosphatase (APISO): 61 U/L (ref 35–144)
BUN/Creatinine Ratio: 15 (calc) (ref 6–22)
BUN: 17 mg/dL (ref 7–25)
CO2: 27 mmol/L (ref 20–32)
Calcium: 9.1 mg/dL (ref 8.6–10.3)
Chloride: 104 mmol/L (ref 98–110)
Creat: 1.15 mg/dL — ABNORMAL HIGH (ref 0.70–1.11)
GFR, Est African American: 69 mL/min/{1.73_m2} (ref >=60)
GFR, Est Non African American: 60 mL/min/{1.73_m2} (ref >=60)
Globulin: 2.6 g/dL (calc) (ref 1.9–3.7)
Glucose, Bld: 114 mg/dL — ABNORMAL HIGH (ref 65–99)
Potassium: 4.2 mmol/L (ref 3.5–5.3)
Sodium: 140 mmol/L (ref 135–146)
Total Bilirubin: 1.9 mg/dL — ABNORMAL HIGH (ref 0.2–1.2)
Total Protein: 6.7 g/dL (ref 6.1–8.1)

## 2020-01-17 LAB — CBC WITH DIFFERENTIAL/PLATELET
Absolute Monocytes: 342 cells/uL (ref 200–950)
Basophils Absolute: 41 cells/uL (ref 0–200)
Basophils Relative: 0.7 %
Eosinophils Absolute: 41 cells/uL (ref 15–500)
Eosinophils Relative: 0.7 %
HCT: 44.9 % (ref 38.5–50.0)
Hemoglobin: 15.1 g/dL (ref 13.2–17.1)
Lymphs Abs: 899 cells/uL (ref 850–3900)
MCH: 32.6 pg (ref 27.0–33.0)
MCHC: 33.6 g/dL (ref 32.0–36.0)
MCV: 97 fL (ref 80.0–100.0)
MPV: 11.3 fL (ref 7.5–12.5)
Monocytes Relative: 5.9 %
Neutro Abs: 4478 cells/uL (ref 1500–7800)
Neutrophils Relative %: 77.2 %
Platelets: 148 10*3/uL (ref 140–400)
RBC: 4.63 10*6/uL (ref 4.20–5.80)
RDW: 13.3 % (ref 11.0–15.0)
Total Lymphocyte: 15.5 %
WBC: 5.8 10*3/uL (ref 3.8–10.8)

## 2020-01-17 LAB — TSH: TSH: 3.48 mIU/L (ref 0.40–4.50)

## 2020-01-17 LAB — VITAMIN B12: Vitamin B-12: 361 pg/mL (ref 200–1100)

## 2020-01-17 NOTE — Progress Notes (Signed)
B12 remains low.  We will need to address this at his visit. Bad cholesterol has improved though still above goal and triglycerides have gone way up for some reason.  ?alcohol vs starchy snacks, sweets or fried foods Sugar average is improved though Kidneys look a little better

## 2020-01-17 NOTE — Telephone Encounter (Signed)
I called patients daughter Andrew Osborne to discuss most recent labs. Andrew Osborne asked that I document some concerns that she has for upcoming appointment in a different section so that it is not attached to lab work.  Andrew Osborne states the elevated triglycerides is probably related to alcohol consumption.  Patients dementia is getting worse and patient is sleeping a lot. Andrew Osborne would like this addressed at pending appointment on 01/19/2020, yet she is uncomfortable bringing this up in front of patient.  Andrew Osborne hopes Dr.Reed can find a creative way to address these concerns.

## 2020-01-17 NOTE — Telephone Encounter (Signed)
Noted. I will print so Dr. Mariea Clonts can read and be prepared for his visit

## 2020-01-19 ENCOUNTER — Encounter: Payer: Self-pay | Admitting: Internal Medicine

## 2020-01-19 ENCOUNTER — Other Ambulatory Visit: Payer: Self-pay

## 2020-01-19 ENCOUNTER — Ambulatory Visit: Payer: Medicare Other | Admitting: Family

## 2020-01-19 ENCOUNTER — Ambulatory Visit (INDEPENDENT_AMBULATORY_CARE_PROVIDER_SITE_OTHER): Payer: Medicare Other | Admitting: Internal Medicine

## 2020-01-19 VITALS — BP 110/72 | HR 76 | Temp 97.5°F | Ht 66.0 in | Wt 156.6 lb

## 2020-01-19 DIAGNOSIS — E861 Hypovolemia: Secondary | ICD-10-CM

## 2020-01-19 DIAGNOSIS — K2100 Gastro-esophageal reflux disease with esophagitis, without bleeding: Secondary | ICD-10-CM

## 2020-01-19 DIAGNOSIS — F0390 Unspecified dementia without behavioral disturbance: Secondary | ICD-10-CM

## 2020-01-19 DIAGNOSIS — I9589 Other hypotension: Secondary | ICD-10-CM

## 2020-01-19 DIAGNOSIS — R7303 Prediabetes: Secondary | ICD-10-CM

## 2020-01-19 DIAGNOSIS — E538 Deficiency of other specified B group vitamins: Secondary | ICD-10-CM | POA: Diagnosis not present

## 2020-01-19 DIAGNOSIS — E782 Mixed hyperlipidemia: Secondary | ICD-10-CM

## 2020-01-19 DIAGNOSIS — D692 Other nonthrombocytopenic purpura: Secondary | ICD-10-CM

## 2020-01-19 MED ORDER — CYANOCOBALAMIN 1000 MCG/ML IJ SOLN
1000.0000 ug | Freq: Once | INTRAMUSCULAR | Status: AC
  Administered 2020-01-19: 1000 ug via INTRAMUSCULAR

## 2020-01-19 MED ORDER — OMEPRAZOLE 40 MG PO CPDR: 40.0000 mg | DELAYED_RELEASE_CAPSULE | Freq: Every day | ORAL | 1 refills | Status: DC
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
## 2020-01-19 NOTE — Patient Instructions (Addendum)
We will increase your b12 injections to every 2 weeks for 3 months, then recheck your level.    Drink 3 bottles of water each day.  Try to drink less alcohol, gatorade and sprite.  These things should help keep your blood pressure normal, help you avoid urinary tract infections and help to lower your triglycerides.    I recommend you play some pickleball or visit the gym to stay active.  Keep reading the paper and keeping your mind stimulated.    Continue the omeprazole because we do not have a good substitute that's any safer.

## 2020-01-20 ENCOUNTER — Other Ambulatory Visit: Payer: Self-pay | Admitting: Family

## 2020-01-20 DIAGNOSIS — K2100 Gastro-esophageal reflux disease with esophagitis, without bleeding: Secondary | ICD-10-CM

## 2020-02-02 ENCOUNTER — Other Ambulatory Visit: Payer: Self-pay

## 2020-02-02 ENCOUNTER — Ambulatory Visit (INDEPENDENT_AMBULATORY_CARE_PROVIDER_SITE_OTHER): Payer: Medicare Other | Admitting: *Deleted

## 2020-02-02 DIAGNOSIS — E538 Deficiency of other specified B group vitamins: Secondary | ICD-10-CM | POA: Diagnosis not present

## 2020-02-02 MED ORDER — CYANOCOBALAMIN 1000 MCG/ML IJ SOLN
1000.0000 ug | Freq: Once | INTRAMUSCULAR | Status: AC
  Administered 2020-02-02: 1000 ug via INTRAMUSCULAR

## 2020-02-12 ENCOUNTER — Other Ambulatory Visit: Payer: Self-pay | Admitting: Family

## 2020-02-12 DIAGNOSIS — F0390 Unspecified dementia without behavioral disturbance: Secondary | ICD-10-CM

## 2020-02-12 DIAGNOSIS — R3916 Straining to void: Secondary | ICD-10-CM

## 2020-02-12 DIAGNOSIS — N401 Enlarged prostate with lower urinary tract symptoms: Secondary | ICD-10-CM

## 2020-02-17 ENCOUNTER — Ambulatory Visit (INDEPENDENT_AMBULATORY_CARE_PROVIDER_SITE_OTHER): Payer: Medicare Other | Admitting: *Deleted

## 2020-02-17 ENCOUNTER — Other Ambulatory Visit: Payer: Self-pay

## 2020-02-17 DIAGNOSIS — E538 Deficiency of other specified B group vitamins: Secondary | ICD-10-CM | POA: Diagnosis not present

## 2020-02-17 MED ORDER — CYANOCOBALAMIN 1000 MCG/ML IJ SOLN
1000.0000 ug | Freq: Once | INTRAMUSCULAR | Status: AC
  Administered 2020-02-17: 1000 ug via INTRAMUSCULAR

## 2020-03-05 ENCOUNTER — Ambulatory Visit (INDEPENDENT_AMBULATORY_CARE_PROVIDER_SITE_OTHER): Payer: Medicare Other | Admitting: *Deleted

## 2020-03-05 ENCOUNTER — Other Ambulatory Visit: Payer: Self-pay

## 2020-03-05 DIAGNOSIS — E538 Deficiency of other specified B group vitamins: Secondary | ICD-10-CM

## 2020-03-05 MED ORDER — CYANOCOBALAMIN 1000 MCG/ML IJ SOLN
1000.0000 ug | Freq: Once | INTRAMUSCULAR | Status: AC
  Administered 2020-03-05: 1000 ug via INTRAMUSCULAR

## 2020-03-20 ENCOUNTER — Ambulatory Visit (INDEPENDENT_AMBULATORY_CARE_PROVIDER_SITE_OTHER): Payer: Medicare Other | Admitting: *Deleted

## 2020-03-20 ENCOUNTER — Other Ambulatory Visit: Payer: Self-pay

## 2020-03-20 DIAGNOSIS — E538 Deficiency of other specified B group vitamins: Secondary | ICD-10-CM | POA: Diagnosis not present

## 2020-03-20 MED ORDER — CYANOCOBALAMIN 1000 MCG/ML IJ SOLN
1000.0000 ug | Freq: Once | INTRAMUSCULAR | Status: AC
  Administered 2020-03-20: 1000 ug via INTRAMUSCULAR

## 2020-04-04 ENCOUNTER — Ambulatory Visit: Payer: Medicare Other

## 2020-04-05 ENCOUNTER — Ambulatory Visit (INDEPENDENT_AMBULATORY_CARE_PROVIDER_SITE_OTHER): Payer: Medicare Other | Admitting: *Deleted

## 2020-04-05 ENCOUNTER — Other Ambulatory Visit: Payer: Self-pay

## 2020-04-05 DIAGNOSIS — E538 Deficiency of other specified B group vitamins: Secondary | ICD-10-CM

## 2020-04-05 MED ORDER — CYANOCOBALAMIN 1000 MCG/ML IJ SOLN
1000.0000 ug | Freq: Once | INTRAMUSCULAR | Status: AC
Start: 2020-04-05 — End: 2020-04-05
  Administered 2020-04-05: 1000 ug via INTRAMUSCULAR

## 2020-04-20 ENCOUNTER — Encounter: Payer: Self-pay | Admitting: Internal Medicine

## 2020-04-20 ENCOUNTER — Other Ambulatory Visit: Payer: Self-pay

## 2020-04-20 ENCOUNTER — Other Ambulatory Visit: Payer: Medicare Other

## 2020-04-20 DIAGNOSIS — E538 Deficiency of other specified B group vitamins: Secondary | ICD-10-CM

## 2020-04-20 LAB — VITAMIN B12: Vitamin B-12: 455 pg/mL (ref 200–1100)

## 2020-04-23 ENCOUNTER — Ambulatory Visit (INDEPENDENT_AMBULATORY_CARE_PROVIDER_SITE_OTHER): Payer: Medicare Other

## 2020-04-23 ENCOUNTER — Ambulatory Visit: Payer: Medicare Other

## 2020-04-23 ENCOUNTER — Other Ambulatory Visit: Payer: Self-pay

## 2020-04-23 DIAGNOSIS — E538 Deficiency of other specified B group vitamins: Secondary | ICD-10-CM | POA: Diagnosis not present

## 2020-04-23 MED ORDER — CYANOCOBALAMIN 1000 MCG/ML IJ SOLN
1000.0000 ug | Freq: Once | INTRAMUSCULAR | Status: AC
  Administered 2020-04-23: 1000 ug via INTRAMUSCULAR

## 2020-05-08 ENCOUNTER — Telehealth: Payer: Self-pay | Admitting: *Deleted

## 2020-05-08 NOTE — Telephone Encounter (Signed)
Patient is coming in tomorrow for his Vitamin B12 Injection. He was getting them every 2 week (started 01/19/2020) for 3 months.   He received another one on 04/23/2020.  His labwork he had done 04/20/20 his level was 455.   Does he need to continue every 2 weeks.  Please Advise.    Called spoke with patient's daughter, she stated lab results had been viewed on MyChart and that there were no questions and verbalized her understanding.   Nelda Bucks Ngetich, NP  04/23/2020 11:53 AM EDT     Vitamin B12 level is within normal range higher than previous level.

## 2020-05-08 NOTE — Telephone Encounter (Signed)
Yes.  Can't believe that's all it was after that much.  Let's continue q2 wks for another 3 months.

## 2020-05-09 ENCOUNTER — Ambulatory Visit (INDEPENDENT_AMBULATORY_CARE_PROVIDER_SITE_OTHER): Payer: Medicare Other | Admitting: *Deleted

## 2020-05-09 ENCOUNTER — Other Ambulatory Visit: Payer: Self-pay

## 2020-05-09 DIAGNOSIS — E538 Deficiency of other specified B group vitamins: Secondary | ICD-10-CM | POA: Diagnosis not present

## 2020-05-09 MED ORDER — CYANOCOBALAMIN 1000 MCG/ML IJ SOLN
1000.0000 ug | Freq: Once | INTRAMUSCULAR | Status: AC
  Administered 2020-05-09: 1000 ug via INTRAMUSCULAR

## 2020-05-20 ENCOUNTER — Encounter: Payer: Self-pay | Admitting: Internal Medicine

## 2020-05-21 DIAGNOSIS — H2511 Age-related nuclear cataract, right eye: Secondary | ICD-10-CM | POA: Diagnosis not present

## 2020-05-21 DIAGNOSIS — H52203 Unspecified astigmatism, bilateral: Secondary | ICD-10-CM | POA: Diagnosis not present

## 2020-05-23 ENCOUNTER — Ambulatory Visit (INDEPENDENT_AMBULATORY_CARE_PROVIDER_SITE_OTHER): Payer: Medicare Other | Admitting: *Deleted

## 2020-05-23 ENCOUNTER — Other Ambulatory Visit: Payer: Self-pay

## 2020-05-23 DIAGNOSIS — E538 Deficiency of other specified B group vitamins: Secondary | ICD-10-CM

## 2020-05-23 MED ORDER — CYANOCOBALAMIN 1000 MCG/ML IJ SOLN
1000.0000 ug | Freq: Once | INTRAMUSCULAR | Status: AC
  Administered 2020-05-23: 1000 ug via INTRAMUSCULAR

## 2020-06-06 ENCOUNTER — Ambulatory Visit: Payer: Medicare Other

## 2020-06-12 ENCOUNTER — Ambulatory Visit (INDEPENDENT_AMBULATORY_CARE_PROVIDER_SITE_OTHER): Payer: Medicare Other

## 2020-06-12 ENCOUNTER — Other Ambulatory Visit: Payer: Self-pay

## 2020-06-12 DIAGNOSIS — Z23 Encounter for immunization: Secondary | ICD-10-CM | POA: Diagnosis not present

## 2020-06-12 DIAGNOSIS — E538 Deficiency of other specified B group vitamins: Secondary | ICD-10-CM | POA: Diagnosis not present

## 2020-06-12 MED ORDER — CYANOCOBALAMIN 1000 MCG/ML IJ SOLN
1000.0000 ug | Freq: Once | INTRAMUSCULAR | Status: AC
  Administered 2020-06-12: 1000 ug via INTRAMUSCULAR

## 2020-06-27 ENCOUNTER — Ambulatory Visit (INDEPENDENT_AMBULATORY_CARE_PROVIDER_SITE_OTHER): Payer: Medicare Other | Admitting: *Deleted

## 2020-06-27 ENCOUNTER — Other Ambulatory Visit: Payer: Self-pay

## 2020-06-27 DIAGNOSIS — E538 Deficiency of other specified B group vitamins: Secondary | ICD-10-CM | POA: Diagnosis not present

## 2020-06-27 MED ORDER — CYANOCOBALAMIN 1000 MCG/ML IJ SOLN
1000.0000 ug | Freq: Once | INTRAMUSCULAR | Status: AC
  Administered 2020-06-27: 1000 ug via INTRAMUSCULAR

## 2020-07-09 HISTORY — PX: DENTAL SURGERY: SHX609

## 2020-07-18 ENCOUNTER — Other Ambulatory Visit: Payer: Medicare Other

## 2020-07-18 ENCOUNTER — Ambulatory Visit (INDEPENDENT_AMBULATORY_CARE_PROVIDER_SITE_OTHER): Payer: Medicare Other | Admitting: *Deleted

## 2020-07-18 ENCOUNTER — Other Ambulatory Visit: Payer: Self-pay

## 2020-07-18 DIAGNOSIS — E861 Hypovolemia: Secondary | ICD-10-CM

## 2020-07-18 DIAGNOSIS — E538 Deficiency of other specified B group vitamins: Secondary | ICD-10-CM

## 2020-07-18 DIAGNOSIS — R7303 Prediabetes: Secondary | ICD-10-CM

## 2020-07-18 DIAGNOSIS — I9589 Other hypotension: Secondary | ICD-10-CM | POA: Diagnosis not present

## 2020-07-18 DIAGNOSIS — D692 Other nonthrombocytopenic purpura: Secondary | ICD-10-CM | POA: Diagnosis not present

## 2020-07-18 DIAGNOSIS — E782 Mixed hyperlipidemia: Secondary | ICD-10-CM

## 2020-07-18 MED ORDER — CYANOCOBALAMIN 1000 MCG/ML IJ SOLN
1000.0000 ug | Freq: Once | INTRAMUSCULAR | Status: AC
  Administered 2020-07-18: 1000 ug via INTRAMUSCULAR

## 2020-07-19 LAB — HEMOGLOBIN A1C
Hgb A1c MFr Bld: 5.7 % of total Hgb — ABNORMAL HIGH (ref <5.7)
Mean Plasma Glucose: 117 mg/dL
eAG (mmol/L): 6.5 mmol/L

## 2020-07-19 LAB — COMPLETE METABOLIC PANEL WITH GFR
AG Ratio: 1.5 (calc) (ref 1.0–2.5)
ALT: 13 U/L (ref 9–46)
AST: 16 U/L (ref 10–35)
Albumin: 4 g/dL (ref 3.6–5.1)
Alkaline phosphatase (APISO): 57 U/L (ref 35–144)
BUN/Creatinine Ratio: 15 (calc) (ref 6–22)
BUN: 18 mg/dL (ref 7–25)
CO2: 32 mmol/L (ref 20–32)
Calcium: 9.1 mg/dL (ref 8.6–10.3)
Chloride: 104 mmol/L (ref 98–110)
Creat: 1.17 mg/dL — ABNORMAL HIGH (ref 0.70–1.11)
GFR, Est African American: 68 mL/min/{1.73_m2} (ref >=60)
GFR, Est Non African American: 59 mL/min/{1.73_m2} — ABNORMAL LOW (ref >=60)
Globulin: 2.7 g/dL (calc) (ref 1.9–3.7)
Glucose, Bld: 128 mg/dL — ABNORMAL HIGH (ref 65–99)
Potassium: 4.2 mmol/L (ref 3.5–5.3)
Sodium: 140 mmol/L (ref 135–146)
Total Bilirubin: 2.1 mg/dL — ABNORMAL HIGH (ref 0.2–1.2)
Total Protein: 6.7 g/dL (ref 6.1–8.1)

## 2020-07-19 LAB — CBC WITH DIFFERENTIAL/PLATELET
Absolute Monocytes: 324 cells/uL (ref 200–950)
Basophils Absolute: 41 cells/uL (ref 0–200)
Basophils Relative: 1 %
Eosinophils Absolute: 70 cells/uL (ref 15–500)
Eosinophils Relative: 1.7 %
HCT: 45.6 % (ref 38.5–50.0)
Hemoglobin: 15.9 g/dL (ref 13.2–17.1)
Lymphs Abs: 1132 cells/uL (ref 850–3900)
MCH: 33.5 pg — ABNORMAL HIGH (ref 27.0–33.0)
MCHC: 34.9 g/dL (ref 32.0–36.0)
MCV: 96.2 fL (ref 80.0–100.0)
MPV: 11.1 fL (ref 7.5–12.5)
Monocytes Relative: 7.9 %
Neutro Abs: 2534 cells/uL (ref 1500–7800)
Neutrophils Relative %: 61.8 %
Platelets: 154 10*3/uL (ref 140–400)
RBC: 4.74 10*6/uL (ref 4.20–5.80)
RDW: 12.8 % (ref 11.0–15.0)
Total Lymphocyte: 27.6 %
WBC: 4.1 10*3/uL (ref 3.8–10.8)

## 2020-07-19 LAB — LIPID PANEL
Cholesterol: 184 mg/dL (ref <200)
HDL: 44 mg/dL (ref >=40)
LDL Cholesterol (Calc): 111 mg/dL (calc) — ABNORMAL HIGH
Non-HDL Cholesterol (Calc): 140 mg/dL (calc) — ABNORMAL HIGH (ref <130)
Total CHOL/HDL Ratio: 4.2 (calc) (ref <5.0)
Triglycerides: 170 mg/dL — ABNORMAL HIGH (ref <150)

## 2020-07-19 LAB — VITAMIN B12: Vitamin B-12: 434 pg/mL (ref 200–1100)

## 2020-07-19 NOTE — Progress Notes (Signed)
B12 remains in 400s.hba1c trended up.  Lipids trended up.  Kidney function about the same.  Blood counts ok.  We'll address at appt

## 2020-07-23 ENCOUNTER — Other Ambulatory Visit: Payer: Self-pay

## 2020-07-23 ENCOUNTER — Ambulatory Visit (INDEPENDENT_AMBULATORY_CARE_PROVIDER_SITE_OTHER): Payer: Medicare Other | Admitting: Internal Medicine

## 2020-07-23 ENCOUNTER — Encounter: Payer: Self-pay | Admitting: Internal Medicine

## 2020-07-23 VITALS — BP 120/80 | HR 58 | Temp 96.9°F | Ht 66.0 in | Wt 160.0 lb

## 2020-07-23 DIAGNOSIS — F0390 Unspecified dementia without behavioral disturbance: Secondary | ICD-10-CM | POA: Diagnosis not present

## 2020-07-23 DIAGNOSIS — E538 Deficiency of other specified B group vitamins: Secondary | ICD-10-CM

## 2020-07-23 DIAGNOSIS — R7303 Prediabetes: Secondary | ICD-10-CM

## 2020-07-23 DIAGNOSIS — R3916 Straining to void: Secondary | ICD-10-CM

## 2020-07-23 DIAGNOSIS — K2100 Gastro-esophageal reflux disease with esophagitis, without bleeding: Secondary | ICD-10-CM

## 2020-07-23 DIAGNOSIS — D801 Nonfamilial hypogammaglobulinemia: Secondary | ICD-10-CM | POA: Diagnosis not present

## 2020-07-23 DIAGNOSIS — N401 Enlarged prostate with lower urinary tract symptoms: Secondary | ICD-10-CM | POA: Diagnosis not present
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
## 2020-07-23 NOTE — Patient Instructions (Addendum)
Stop using the pepcid and tums so much.  It's interfering with b12 absorption.  Go to: ShippingScam.co.uk  Booster Information COVID-19 BOOSTER UPDATE: Everyone Ages 18+ Is Eligible for a COVID-19 Booster  Appointments are required. To register for your free vaccination booster appointment,  call [phone] (Mon-Fri 7 a.m. to 7 p.m).  There is a calendar link to schedule on the website at various locations around the [place].  Pfizer-BioNTech or Moderna, eligible 6 months after receiving your second shot. Single-dose [employer], eligible 2 months after receiving your initial shot. Following FDA and CDC recommendations, we can "mix-and-match" booster doses at the clinics. You must attest to the date you received your last dose of the [place], [place] or Wynetta Emery and [employer] vaccine to schedule your booster dose.

## 2020-08-02 ENCOUNTER — Ambulatory Visit (INDEPENDENT_AMBULATORY_CARE_PROVIDER_SITE_OTHER): Payer: Medicare Other

## 2020-08-02 ENCOUNTER — Other Ambulatory Visit: Payer: Self-pay

## 2020-08-02 DIAGNOSIS — E538 Deficiency of other specified B group vitamins: Secondary | ICD-10-CM | POA: Diagnosis not present

## 2020-08-02 MED ORDER — CYANOCOBALAMIN 1000 MCG/ML IJ SOLN
1000.0000 ug | Freq: Once | INTRAMUSCULAR | Status: AC
  Administered 2020-08-02: 1000 ug via INTRAMUSCULAR

## 2020-08-17 ENCOUNTER — Other Ambulatory Visit: Payer: Self-pay

## 2020-08-17 ENCOUNTER — Ambulatory Visit (INDEPENDENT_AMBULATORY_CARE_PROVIDER_SITE_OTHER): Payer: Medicare Other | Admitting: *Deleted

## 2020-08-17 DIAGNOSIS — E538 Deficiency of other specified B group vitamins: Secondary | ICD-10-CM

## 2020-08-17 MED ORDER — CYANOCOBALAMIN 1000 MCG/ML IJ SOLN
1000.0000 ug | Freq: Once | INTRAMUSCULAR | Status: AC
  Administered 2020-08-17: 1000 ug via INTRAMUSCULAR

## 2020-09-02 ENCOUNTER — Other Ambulatory Visit: Payer: Self-pay | Admitting: Family

## 2020-09-02 DIAGNOSIS — K2100 Gastro-esophageal reflux disease with esophagitis, without bleeding: Secondary | ICD-10-CM

## 2020-09-03 ENCOUNTER — Ambulatory Visit (INDEPENDENT_AMBULATORY_CARE_PROVIDER_SITE_OTHER): Payer: Medicare Other

## 2020-09-03 ENCOUNTER — Other Ambulatory Visit: Payer: Self-pay

## 2020-09-03 DIAGNOSIS — E538 Deficiency of other specified B group vitamins: Secondary | ICD-10-CM | POA: Diagnosis not present

## 2020-09-03 DIAGNOSIS — K2100 Gastro-esophageal reflux disease with esophagitis, without bleeding: Secondary | ICD-10-CM

## 2020-09-03 MED ORDER — CYANOCOBALAMIN 1000 MCG/ML IJ SOLN
1000.0000 ug | Freq: Once | INTRAMUSCULAR | Status: AC
  Administered 2020-09-03: 1000 ug via INTRAMUSCULAR

## 2020-09-03 MED ORDER — OMEPRAZOLE 40 MG PO CPDR: 40.0000 mg | DELAYED_RELEASE_CAPSULE | Freq: Every day | ORAL | 1 refills | Status: DC

## 2020-09-19 ENCOUNTER — Ambulatory Visit (INDEPENDENT_AMBULATORY_CARE_PROVIDER_SITE_OTHER): Payer: Medicare Other

## 2020-09-19 ENCOUNTER — Other Ambulatory Visit: Payer: Self-pay

## 2020-09-19 DIAGNOSIS — E538 Deficiency of other specified B group vitamins: Secondary | ICD-10-CM | POA: Diagnosis not present

## 2020-09-19 MED ORDER — CYANOCOBALAMIN 1000 MCG/ML IJ SOLN
1000.0000 ug | Freq: Once | INTRAMUSCULAR | Status: AC
  Administered 2020-09-19: 1000 ug via INTRAMUSCULAR

## 2020-10-01 ENCOUNTER — Encounter: Payer: Self-pay | Admitting: Internal Medicine

## 2020-10-05 ENCOUNTER — Other Ambulatory Visit: Payer: Self-pay

## 2020-10-05 ENCOUNTER — Ambulatory Visit (INDEPENDENT_AMBULATORY_CARE_PROVIDER_SITE_OTHER): Payer: Medicare Other | Admitting: *Deleted

## 2020-10-05 DIAGNOSIS — E538 Deficiency of other specified B group vitamins: Secondary | ICD-10-CM | POA: Diagnosis not present

## 2020-10-05 MED ORDER — CYANOCOBALAMIN 1000 MCG/ML IJ SOLN
1000.0000 ug | Freq: Once | INTRAMUSCULAR | Status: AC
  Administered 2020-10-05: 1000 ug via INTRAMUSCULAR

## 2020-10-12 ENCOUNTER — Encounter: Payer: Self-pay | Admitting: Internal Medicine

## 2020-10-15 ENCOUNTER — Telehealth: Payer: Self-pay | Admitting: *Deleted

## 2020-10-15 NOTE — Telephone Encounter (Signed)
Agree with palliative care referral re: his dementia.

## 2020-10-15 NOTE — Telephone Encounter (Signed)
Denise notified and agreed.

## 2020-10-15 NOTE — Telephone Encounter (Signed)
Langley Gauss with [facility] called and stated that Daughter is requesting palliative care for patient. Wants verbal order requesting services.  Please Advise.

## 2020-10-16 ENCOUNTER — Ambulatory Visit: Payer: Medicare Other | Admitting: Nurse Practitioner

## 2020-10-16 ENCOUNTER — Telehealth: Payer: Self-pay

## 2020-10-16 ENCOUNTER — Other Ambulatory Visit: Payer: Self-pay

## 2020-10-16 NOTE — Telephone Encounter (Signed)
Spoke with patient's daughter [name] and scheduled an in-person Palliative Consult for 10/26/20 @ 2PM  COVID screening was negative. No pets in home. Patient lives with wife, granddaughter, and great grandkids.  Consent obtained; updated Outlook/Netsmart/Team List and Epic.  Family is aware they may be receiving a call from NP the day before or day of to confirm appointment.

## 2020-10-20 ENCOUNTER — Other Ambulatory Visit: Payer: Self-pay | Admitting: Internal Medicine

## 2020-10-20 DIAGNOSIS — R3916 Straining to void: Secondary | ICD-10-CM

## 2020-10-20 DIAGNOSIS — N401 Enlarged prostate with lower urinary tract symptoms: Secondary | ICD-10-CM

## 2020-10-20 DIAGNOSIS — F0390 Unspecified dementia without behavioral disturbance: Secondary | ICD-10-CM

## 2020-10-23 ENCOUNTER — Ambulatory Visit: Payer: Medicare Other

## 2020-10-24 ENCOUNTER — Ambulatory Visit: Payer: Self-pay | Admitting: Nurse Practitioner

## 2020-10-24 ENCOUNTER — Other Ambulatory Visit: Payer: Self-pay

## 2020-10-24 ENCOUNTER — Ambulatory Visit: Payer: Medicare Other

## 2020-10-24 ENCOUNTER — Encounter: Payer: Self-pay | Admitting: Nurse Practitioner

## 2020-10-24 ENCOUNTER — Ambulatory Visit (INDEPENDENT_AMBULATORY_CARE_PROVIDER_SITE_OTHER): Payer: Medicare Other | Admitting: Nurse Practitioner

## 2020-10-24 VITALS — BP 126/84 | HR 64 | Temp 97.1°F | Ht 66.0 in | Wt 159.0 lb

## 2020-10-24 DIAGNOSIS — Z0000 Encounter for general adult medical examination without abnormal findings: Secondary | ICD-10-CM | POA: Diagnosis not present

## 2020-10-24 DIAGNOSIS — E538 Deficiency of other specified B group vitamins: Secondary | ICD-10-CM | POA: Diagnosis not present

## 2020-10-24 MED ORDER — CYANOCOBALAMIN 1000 MCG/ML IJ SOLN
1000.0000 ug | Freq: Once | INTRAMUSCULAR | Status: AC
  Administered 2020-10-24: 1000 ug via INTRAMUSCULAR
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
## 2020-10-24 NOTE — Patient Instructions (Signed)
Andrew Osborne , Thank you for taking time to come for your Medicare Wellness Visit. I appreciate your ongoing commitment to your health goals. Please review the following plan we discussed and let me know if I can assist you in the future.   Screening recommendations/referrals: Colonoscopy aged out Recommended yearly ophthalmology/optometry visit for glaucoma screening and checkup Recommended yearly dental visit for hygiene and checkup  Vaccinations: Influenza vaccine up to date Pneumococcal vaccine up to date Tdap vaccine up to date Shingles vaccine RECOMMENDED- to get shingrix at local pharmacy    Advanced directives: on file.   Conditions/risks identified: advance age, progressive memory loss  Next appointment: 1 year for AWV  Preventive Care 81 Years and Older, Male Preventive care refers to lifestyle choices and visits with your health care provider that can promote health and wellness. What does preventive care include?  A yearly physical exam. This is also called an annual well check.  Dental exams once or twice a year.  Routine eye exams. Ask your health care provider how often you should have your eyes checked.  Personal lifestyle choices, including:  Daily care of your teeth and gums.  Regular physical activity.  Eating a healthy diet.  Avoiding tobacco and drug use.  Limiting alcohol use.  Practicing safe sex.  Taking low doses of aspirin every day.  Taking vitamin and mineral supplements as recommended by your health care provider. What happens during an annual well check? The services and screenings done by your health care provider during your annual well check will depend on your age, overall health, lifestyle risk factors, and family history of disease. Counseling  Your health care provider may ask you questions about your:  Alcohol use.  Tobacco use.  Drug use.  Emotional well-being.  Home and relationship well-being.  Sexual  activity.  Eating habits.  History of falls.  Memory and ability to understand (cognition).  Work and work Statistician. Screening  You may have the following tests or measurements:  Height, weight, and BMI.  Blood pressure.  Lipid and cholesterol levels. These may be checked every 5 years, or more frequently if you are over 74 years old.  Skin check.  Lung cancer screening. You may have this screening every year starting at age 34 if you have a 30-pack-year history of smoking and currently smoke or have quit within the past 15 years.  Fecal occult blood test (FOBT) of the stool. You may have this test every year starting at age 47.  Flexible sigmoidoscopy or colonoscopy. You may have a sigmoidoscopy every 5 years or a colonoscopy every 10 years starting at age 45.  Prostate cancer screening. Recommendations will vary depending on your family history and other risks.  Hepatitis C blood test.  Hepatitis B blood test.  Sexually transmitted disease (STD) testing.  Diabetes screening. This is done by checking your blood sugar (glucose) after you have not eaten for a while (fasting). You may have this done every 1-3 years.  Abdominal aortic aneurysm (AAA) screening. You may need this if you are a current or former smoker.  Osteoporosis. You may be screened starting at age 56 if you are at high risk. Talk with your health care provider about your test results, treatment options, and if necessary, the need for more tests. Vaccines  Your health care provider may recommend certain vaccines, such as:  Influenza vaccine. This is recommended every year.  Tetanus, diphtheria, and acellular pertussis (Tdap, Td) vaccine. You may need a Td  booster every 10 years.  Zoster vaccine. You may need this after age 55.  Pneumococcal 13-valent conjugate (PCV13) vaccine. One dose is recommended after age 47.  Pneumococcal polysaccharide (PPSV23) vaccine. One dose is recommended after age  39. Talk to your health care provider about which screenings and vaccines you need and how often you need them. This information is not intended to replace advice given to you by your health care provider. Make sure you discuss any questions you have with your health care provider. Document Released: 08/24/2015 Document Revised: 04/16/2016 Document Reviewed: 05/29/2015 Elsevier Interactive Patient Education  2017 [employer] Prevention in the Home Falls can cause injuries. They can happen to people of all ages. There are many things you can do to make your home safe and to help prevent falls. What can I do on the outside of my home?  Regularly fix the edges of walkways and driveways and fix any cracks.  Remove anything that might make you trip as you walk through a door, such as a raised step or threshold.  Trim any bushes or trees on the path to your home.  Use bright outdoor lighting.  Clear any walking paths of anything that might make someone trip, such as rocks or tools.  Regularly check to see if handrails are loose or broken. Make sure that both sides of any steps have handrails.  Any raised decks and porches should have guardrails on the edges.  Have any leaves, snow, or ice cleared regularly.  Use sand or salt on walking paths during winter.  Clean up any spills in your garage right away. This includes oil or grease spills. What can I do in the bathroom?  Use night lights.  Install grab bars by the toilet and in the tub and shower. Do not use towel bars as grab bars.  Use non-skid mats or decals in the tub or shower.  If you need to sit down in the shower, use a plastic, non-slip stool.  Keep the floor dry. Clean up any water that spills on the floor as soon as it happens.  Remove soap buildup in the tub or shower regularly.  Attach bath mats securely with double-sided non-slip rug tape.  Do not have throw rugs and other things on the floor that can make  you trip. What can I do in the bedroom?  Use night lights.  Make sure that you have a light by your bed that is easy to reach.  Do not use any sheets or blankets that are too big for your bed. They should not hang down onto the floor.  Have a firm chair that has side arms. You can use this for support while you get dressed.  Do not have throw rugs and other things on the floor that can make you trip. What can I do in the kitchen?  Clean up any spills right away.  Avoid walking on wet floors.  Keep items that you use a lot in easy-to-reach places.  If you need to reach something above you, use a strong step stool that has a grab bar.  Keep electrical cords out of the way.  Do not use floor polish or wax that makes floors slippery. If you must use wax, use non-skid floor wax.  Do not have throw rugs and other things on the floor that can make you trip. What can I do with my stairs?  Do not leave any items on the stairs.  Make sure that there are handrails on both sides of the stairs and use them. Fix handrails that are broken or loose. Make sure that handrails are as long as the stairways.  Check any carpeting to make sure that it is firmly attached to the stairs. Fix any carpet that is loose or worn.  Avoid having throw rugs at the top or bottom of the stairs. If you do have throw rugs, attach them to the floor with carpet tape.  Make sure that you have a light switch at the top of the stairs and the bottom of the stairs. If you do not have them, ask someone to add them for you. What else can I do to help prevent falls?  Wear shoes that:  Do not have high heels.  Have rubber bottoms.  Are comfortable and fit you well.  Are closed at the toe. Do not wear sandals.  If you use a stepladder:  Make sure that it is fully opened. Do not climb a closed stepladder.  Make sure that both sides of the stepladder are locked into place.  Ask someone to hold it for you, if  possible.  Clearly mark and make sure that you can see:  Any grab bars or handrails.  First and last steps.  Where the edge of each step is.  Use tools that help you move around (mobility aids) if they are needed. These include:  Canes.  Walkers.  Scooters.  Crutches.  Turn on the lights when you go into a dark area. Replace any light bulbs as soon as they burn out.  Set up your furniture so you have a clear path. Avoid moving your furniture around.  If any of your floors are uneven, fix them.  If there are any pets around you, be aware of where they are.  Review your medicines with your doctor. Some medicines can make you feel dizzy. This can increase your chance of falling. Ask your doctor what other things that you can do to help prevent falls. This information is not intended to replace advice given to you by your health care provider. Make sure you discuss any questions you have with your health care provider. Document Released: 05/24/2009 Document Revised: 01/03/2016 Document Reviewed: 09/01/2014 Elsevier Interactive Patient Education  2017 [employer].

## 2020-10-26 ENCOUNTER — Other Ambulatory Visit: Payer: Self-pay

## 2020-10-26 ENCOUNTER — Other Ambulatory Visit: Payer: Medicare Other | Admitting: Nurse Practitioner

## 2020-10-26 DIAGNOSIS — Z515 Encounter for palliative care: Secondary | ICD-10-CM | POA: Diagnosis not present

## 2020-10-26 DIAGNOSIS — F0280 Dementia in other diseases classified elsewhere without behavioral disturbance: Secondary | ICD-10-CM
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
## 2020-10-30 ENCOUNTER — Telehealth: Payer: Self-pay

## 2020-10-30 NOTE — Telephone Encounter (Signed)
(  4:27p) SW completed follow-up call to patient's daughter-[name] per request of NP. [name] discussed the advancement of patient's dementia. She has a desire to move her father in with her at her home, however she has a step-mother who is refusing care, help or to move to an assisted living. She is wondering what is best route to take. SW provided active listening as she discuss the options for placement for her stepmother. SW encouraged [name] to discuss concerns and care needs with his stepmother's son about the concerns and strongly advise him to participate in patient's care. SW provided reassurance of support and encouraged her to call with any additional support or concerns.

## 2020-11-22 ENCOUNTER — Ambulatory Visit: Payer: Medicare Other | Admitting: Internal Medicine

## 2020-11-26 ENCOUNTER — Other Ambulatory Visit: Payer: Self-pay

## 2020-11-26 ENCOUNTER — Encounter: Payer: Self-pay | Admitting: Nurse Practitioner

## 2020-11-26 ENCOUNTER — Ambulatory Visit (INDEPENDENT_AMBULATORY_CARE_PROVIDER_SITE_OTHER): Payer: Medicare Other | Admitting: Nurse Practitioner

## 2020-11-26 VITALS — BP 128/76 | HR 58 | Temp 96.9°F | Ht 66.0 in | Wt 159.0 lb

## 2020-11-26 DIAGNOSIS — E538 Deficiency of other specified B group vitamins: Secondary | ICD-10-CM

## 2020-11-26 DIAGNOSIS — I10 Essential (primary) hypertension: Secondary | ICD-10-CM

## 2020-11-26 DIAGNOSIS — N401 Enlarged prostate with lower urinary tract symptoms: Secondary | ICD-10-CM | POA: Diagnosis not present

## 2020-11-26 DIAGNOSIS — R7303 Prediabetes: Secondary | ICD-10-CM | POA: Diagnosis not present

## 2020-11-26 DIAGNOSIS — R3916 Straining to void: Secondary | ICD-10-CM | POA: Diagnosis not present

## 2020-11-26 DIAGNOSIS — K2100 Gastro-esophageal reflux disease with esophagitis, without bleeding: Secondary | ICD-10-CM

## 2020-11-26 DIAGNOSIS — D692 Other nonthrombocytopenic purpura: Secondary | ICD-10-CM | POA: Diagnosis not present

## 2020-11-26 DIAGNOSIS — F0390 Unspecified dementia without behavioral disturbance: Secondary | ICD-10-CM

## 2020-11-26 MED ORDER — CYANOCOBALAMIN 1000 MCG/ML IJ SOLN
1000.0000 ug | Freq: Once | INTRAMUSCULAR | Status: AC
  Administered 2020-11-26: 1000 ug via INTRAMUSCULAR
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
## 2020-11-26 NOTE — Patient Instructions (Signed)
Continue vit b12 injection every 2 weeks.

## 2020-11-27 ENCOUNTER — Ambulatory Visit: Payer: Medicare Other

## 2020-11-27 LAB — CBC WITH DIFFERENTIAL/PLATELET
Absolute Monocytes: 505 cells/uL (ref 200–950)
Basophils Absolute: 60 cells/uL (ref 0–200)
Basophils Relative: 1.2 %
Eosinophils Absolute: 70 cells/uL (ref 15–500)
Eosinophils Relative: 1.4 %
HCT: 45.1 % (ref 38.5–50.0)
Hemoglobin: 15.5 g/dL (ref 13.2–17.1)
Lymphs Abs: 1050 cells/uL (ref 850–3900)
MCH: 33.3 pg — ABNORMAL HIGH (ref 27.0–33.0)
MCHC: 34.4 g/dL (ref 32.0–36.0)
MCV: 96.8 fL (ref 80.0–100.0)
MPV: 11.1 fL (ref 7.5–12.5)
Monocytes Relative: 10.1 %
Neutro Abs: 3315 cells/uL (ref 1500–7800)
Neutrophils Relative %: 66.3 %
Platelets: 133 10*3/uL — ABNORMAL LOW (ref 140–400)
RBC: 4.66 10*6/uL (ref 4.20–5.80)
RDW: 13.1 % (ref 11.0–15.0)
Total Lymphocyte: 21 %
WBC: 5 10*3/uL (ref 3.8–10.8)

## 2020-11-27 LAB — COMPLETE METABOLIC PANEL WITH GFR
AG Ratio: 1.6 (calc) (ref 1.0–2.5)
ALT: 13 U/L (ref 9–46)
AST: 18 U/L (ref 10–35)
Albumin: 4 g/dL (ref 3.6–5.1)
Alkaline phosphatase (APISO): 58 U/L (ref 35–144)
BUN/Creatinine Ratio: 14 (calc) (ref 6–22)
BUN: 18 mg/dL (ref 7–25)
CO2: 29 mmol/L (ref 20–32)
Calcium: 8.8 mg/dL (ref 8.6–10.3)
Chloride: 104 mmol/L (ref 98–110)
Creat: 1.29 mg/dL — ABNORMAL HIGH (ref 0.70–1.11)
GFR, Est African American: 60 mL/min/{1.73_m2} (ref >=60)
GFR, Est Non African American: 52 mL/min/{1.73_m2} — ABNORMAL LOW (ref >=60)
Globulin: 2.5 g/dL (calc) (ref 1.9–3.7)
Glucose, Bld: 96 mg/dL (ref 65–139)
Potassium: 4.1 mmol/L (ref 3.5–5.3)
Sodium: 140 mmol/L (ref 135–146)
Total Bilirubin: 2.6 mg/dL — ABNORMAL HIGH (ref 0.2–1.2)
Total Protein: 6.5 g/dL (ref 6.1–8.1)

## 2020-11-27 LAB — HEMOGLOBIN A1C
Hgb A1c MFr Bld: 5.8 % of total Hgb — ABNORMAL HIGH (ref <5.7)
Mean Plasma Glucose: 120 mg/dL
eAG (mmol/L): 6.6 mmol/L

## 2020-12-07 ENCOUNTER — Other Ambulatory Visit: Payer: Self-pay

## 2020-12-07 ENCOUNTER — Other Ambulatory Visit: Payer: Medicare Other | Admitting: Nurse Practitioner

## 2020-12-07 VITALS — BP 128/90 | HR 51 | Resp 16

## 2020-12-07 DIAGNOSIS — R451 Restlessness and agitation: Secondary | ICD-10-CM | POA: Diagnosis not present

## 2020-12-07 DIAGNOSIS — Z515 Encounter for palliative care: Secondary | ICD-10-CM | POA: Diagnosis not present
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
## 2020-12-11 ENCOUNTER — Ambulatory Visit (INDEPENDENT_AMBULATORY_CARE_PROVIDER_SITE_OTHER): Payer: Medicare Other

## 2020-12-11 ENCOUNTER — Other Ambulatory Visit: Payer: Self-pay

## 2020-12-11 DIAGNOSIS — E538 Deficiency of other specified B group vitamins: Secondary | ICD-10-CM | POA: Diagnosis not present

## 2020-12-11 MED ORDER — CYANOCOBALAMIN 1000 MCG/ML IJ SOLN
1000.0000 ug | Freq: Once | INTRAMUSCULAR | Status: AC
  Administered 2020-12-11: 1000 ug via INTRAMUSCULAR

## 2020-12-26 ENCOUNTER — Telehealth: Payer: Self-pay

## 2020-12-26 DIAGNOSIS — R410 Disorientation, unspecified: Secondary | ICD-10-CM
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
## 2020-12-26 NOTE — Telephone Encounter (Signed)
Order placed

## 2020-12-26 NOTE — Telephone Encounter (Addendum)
Patients daughter would like this done without the patients knowledge at his b12 appointment tomorrow. I will forward to Anita,CMA who works in clinical intake and handles the nurse visit. Per Janett Billow we can tell patient he is due to collect a urine sample.   I will also forward to [place], CMA as she is covering clinical intake tomorrow from 1-2 and patients appointment is at 1:45 pm

## 2020-12-27 ENCOUNTER — Ambulatory Visit (INDEPENDENT_AMBULATORY_CARE_PROVIDER_SITE_OTHER): Payer: Medicare Other

## 2020-12-27 ENCOUNTER — Other Ambulatory Visit: Payer: Self-pay

## 2020-12-27 ENCOUNTER — Encounter: Payer: Self-pay | Admitting: Nurse Practitioner

## 2020-12-27 DIAGNOSIS — R410 Disorientation, unspecified: Secondary | ICD-10-CM

## 2020-12-27 DIAGNOSIS — E538 Deficiency of other specified B group vitamins: Secondary | ICD-10-CM

## 2020-12-27 MED ORDER — CYANOCOBALAMIN 1000 MCG/ML IJ SOLN
1000.0000 ug | Freq: Once | INTRAMUSCULAR | Status: AC
  Administered 2020-12-27: 1000 ug via INTRAMUSCULAR

## 2020-12-28 LAB — DRUG MONITOR, PANEL 1, W/CONF, URINE
Amphetamines: NEGATIVE ng/mL (ref <500)
Barbiturates: NEGATIVE ng/mL (ref <300)
Benzodiazepines: NEGATIVE ng/mL (ref <100)
Cocaine Metabolite: NEGATIVE ng/mL (ref <150)
Creatinine: 201.6 mg/dL
Marijuana Metabolite: NEGATIVE ng/mL (ref <20)
Methadone Metabolite: NEGATIVE ng/mL (ref <100)
Opiates: NEGATIVE ng/mL (ref <100)
Oxidant: NEGATIVE ug/mL
Oxycodone: NEGATIVE ng/mL (ref <100)
Phencyclidine: NEGATIVE ng/mL (ref <25)
pH: 6.1 (ref 4.5–9.0)

## 2020-12-28 LAB — DM TEMPLATE

## 2020-12-31 NOTE — Telephone Encounter (Signed)
Noted  

## 2021-01-04 ENCOUNTER — Telehealth: Payer: Self-pay | Admitting: *Deleted

## 2021-01-04 NOTE — Telephone Encounter (Signed)
Received fax from Daughter, Catalina Antigua. Signing up patient for [employer].  Form placed in [facility] folder to review, fill out and sign.  To be faxed to [facility] once completed to Fax: [phone]

## 2021-01-10 ENCOUNTER — Ambulatory Visit (INDEPENDENT_AMBULATORY_CARE_PROVIDER_SITE_OTHER): Payer: Medicare Other

## 2021-01-10 ENCOUNTER — Other Ambulatory Visit: Payer: Self-pay

## 2021-01-10 DIAGNOSIS — E538 Deficiency of other specified B group vitamins: Secondary | ICD-10-CM

## 2021-01-10 MED ORDER — CYANOCOBALAMIN 1000 MCG/ML IJ SOLN
1000.0000 ug | Freq: Once | INTRAMUSCULAR | Status: AC
  Administered 2021-01-10: 1000 ug via INTRAMUSCULAR

## 2021-01-10 NOTE — Telephone Encounter (Addendum)
Form Completed and faxed to Arnell Asal with [employer] per daughter's request.  Sent copy to scanning.

## 2021-01-14 ENCOUNTER — Ambulatory Visit: Payer: Medicare Other

## 2021-01-16 ENCOUNTER — Encounter: Payer: Self-pay | Admitting: Family Medicine

## 2021-01-16 ENCOUNTER — Ambulatory Visit (INDEPENDENT_AMBULATORY_CARE_PROVIDER_SITE_OTHER): Payer: Medicare Other | Admitting: Family Medicine

## 2021-01-16 ENCOUNTER — Other Ambulatory Visit: Payer: Self-pay

## 2021-01-16 ENCOUNTER — Telehealth: Payer: Self-pay | Admitting: *Deleted

## 2021-01-16 VITALS — BP 118/78 | HR 74 | Temp 97.3°F | Ht 66.0 in | Wt 156.2 lb

## 2021-01-16 DIAGNOSIS — R350 Frequency of micturition: Secondary | ICD-10-CM

## 2021-01-16 DIAGNOSIS — F0391 Unspecified dementia with behavioral disturbance: Secondary | ICD-10-CM | POA: Diagnosis not present

## 2021-01-16 LAB — POCT URINALYSIS DIPSTICK
Bilirubin, UA: NEGATIVE
Glucose, UA: NEGATIVE
Ketones, UA: NEGATIVE
Nitrite, UA: NEGATIVE
Protein, UA: POSITIVE — AB
Spec Grav, UA: 1.02 (ref 1.010–1.025)
Urobilinogen, UA: 0.2 E.U./dL
pH, UA: 6 (ref 5.0–8.0)

## 2021-01-16 MED ORDER — MEMANTINE HCL 5 MG PO TABS: 5.0000 mg | ORAL_TABLET | Freq: Two times a day (BID) | ORAL | 1 refills | Status: DC
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
## 2021-01-16 NOTE — Telephone Encounter (Signed)
Daughter Scheduled an appointment for today in a cancellation slot to be seen by Dr. Sabra Heck.

## 2021-01-16 NOTE — Telephone Encounter (Signed)
Yes this is okay 

## 2021-01-16 NOTE — Addendum Note (Signed)
Addended by: Wardell Honour on: 01/16/2021 02:21 PM   Modules accepted: Orders

## 2021-01-16 NOTE — Patient Instructions (Signed)
Encourage fluid intake Will add new drug, Namenda.  Begin at 5 mg/week and increase by 5 mg every week until 4 tabs or 20 mg is obtained.  Call if there are side effects

## 2021-01-16 NOTE — Telephone Encounter (Signed)
[name],daughter called and stated that she thinks patient is declining with the Dementia. Stated that he is wetting his pants often during the day and night.   Also stated that he is not sleeping much at night.   Daughter is wanting to make sure it is Dementia not UTI.   No available appointment to bring patient into office until Friday with Monina. Is this ok.   Please Advise.

## 2021-01-17 LAB — BASIC METABOLIC PANEL
BUN/Creatinine Ratio: 12 (calc) (ref 6–22)
BUN: 13 mg/dL (ref 7–25)
CO2: 30 mmol/L (ref 20–32)
Calcium: 9.3 mg/dL (ref 8.6–10.3)
Chloride: 102 mmol/L (ref 98–110)
Creat: 1.12 mg/dL — ABNORMAL HIGH (ref 0.70–1.11)
Glucose, Bld: 99 mg/dL (ref 65–99)
Potassium: 3.9 mmol/L (ref 3.5–5.3)
Sodium: 139 mmol/L (ref 135–146)

## 2021-01-18 LAB — URINE CULTURE
MICRO NUMBER:: 11984593
SPECIMEN QUALITY:: ADEQUATE

## 2021-01-23 ENCOUNTER — Telehealth: Payer: Self-pay | Admitting: *Deleted

## 2021-01-23 ENCOUNTER — Other Ambulatory Visit: Payer: Self-pay | Admitting: Family Medicine

## 2021-01-23 MED ORDER — AMOXICILLIN 500 MG PO CAPS: 500.0000 mg | ORAL_CAPSULE | Freq: Three times a day (TID) | ORAL | 0 refills | Status: DC

## 2021-01-23 NOTE — Telephone Encounter (Signed)
Patient daughter, [name], called and stated that she got a call yesterday regarding antibiotic but when she went to the pharmacy twice yesterday they never received it.   Reviewed patient's chart and antibiotic was never sent.   Medication list updated and Rx sent to pharmacy. Daughter aware.

## 2021-01-25 ENCOUNTER — Ambulatory Visit (INDEPENDENT_AMBULATORY_CARE_PROVIDER_SITE_OTHER): Payer: Medicare Other | Admitting: *Deleted

## 2021-01-25 ENCOUNTER — Other Ambulatory Visit: Payer: Self-pay

## 2021-01-25 DIAGNOSIS — E538 Deficiency of other specified B group vitamins: Secondary | ICD-10-CM | POA: Diagnosis not present

## 2021-01-25 MED ORDER — CYANOCOBALAMIN 1000 MCG/ML IJ SOLN
1000.0000 ug | Freq: Once | INTRAMUSCULAR | Status: AC
  Administered 2021-01-25: 1000 ug via INTRAMUSCULAR

## 2021-02-08 ENCOUNTER — Other Ambulatory Visit: Payer: Self-pay

## 2021-02-08 ENCOUNTER — Telehealth: Payer: Self-pay

## 2021-02-08 ENCOUNTER — Ambulatory Visit (INDEPENDENT_AMBULATORY_CARE_PROVIDER_SITE_OTHER): Payer: Medicare Other | Admitting: Family

## 2021-02-08 ENCOUNTER — Encounter: Payer: Self-pay | Admitting: Nurse Practitioner

## 2021-02-08 ENCOUNTER — Encounter: Payer: Self-pay | Admitting: Family

## 2021-02-08 VITALS — BP 130/90 | HR 77 | Temp 97.7°F | Resp 16 | Ht 66.0 in | Wt 153.0 lb

## 2021-02-08 DIAGNOSIS — R319 Hematuria, unspecified: Secondary | ICD-10-CM

## 2021-02-08 DIAGNOSIS — R350 Frequency of micturition: Secondary | ICD-10-CM

## 2021-02-08 DIAGNOSIS — R300 Dysuria: Secondary | ICD-10-CM

## 2021-02-08 LAB — POCT URINALYSIS DIPSTICK
Bilirubin, UA: NEGATIVE
Glucose, UA: NEGATIVE
Ketones, UA: POSITIVE
Nitrite, UA: POSITIVE
Protein, UA: POSITIVE — AB
Spec Grav, UA: >=1.03 — AB (ref 1.010–1.025)
Urobilinogen, UA: NEGATIVE E.U./dL — AB
pH, UA: 6.5 (ref 5.0–8.0)

## 2021-02-08 MED ORDER — CIPROFLOXACIN HCL 500 MG PO TABS
500.0000 mg | ORAL_TABLET | Freq: Two times a day (BID) | ORAL | 0 refills | Status: AC
Start: 2021-02-08 — End: 2021-02-15

## 2021-02-08 MED ORDER — SACCHAROMYCES BOULARDII 250 MG PO CAPS
250.0000 mg | ORAL_CAPSULE | Freq: Two times a day (BID) | ORAL | 0 refills | Status: AC
Start: 2021-02-08 — End: 2021-02-18

## 2021-02-08 NOTE — Telephone Encounter (Signed)
Patient's daughter called stating the patient had UTI a couple weeks ago, he has finished antibiotic but noticed blood in urine today. Please advised. To Joelene Millin, NP.

## 2021-02-08 NOTE — Telephone Encounter (Signed)
He will need to be evaluated for that

## 2021-02-08 NOTE — Patient Instructions (Signed)
-   increase water intake to 6-9 glasses of water daily  - Notify provider if symptoms worsen or fail to improve

## 2021-02-08 NOTE — Telephone Encounter (Signed)
DW/[name]. Patient is on his way to the office.
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
## 2021-02-08 NOTE — Telephone Encounter (Signed)
Patient is coming in 02/08/2021 with Marlowe Sax, NP

## 2021-02-09 LAB — URINE CULTURE
MICRO NUMBER:: 12075687
SPECIMEN QUALITY:: ADEQUATE

## 2021-02-12 ENCOUNTER — Other Ambulatory Visit: Payer: Self-pay

## 2021-02-12 DIAGNOSIS — R319 Hematuria, unspecified: Secondary | ICD-10-CM

## 2021-02-12 DIAGNOSIS — R8290 Unspecified abnormal findings in urine: Secondary | ICD-10-CM

## 2021-02-13 ENCOUNTER — Ambulatory Visit (INDEPENDENT_AMBULATORY_CARE_PROVIDER_SITE_OTHER): Payer: Medicare Other

## 2021-02-13 ENCOUNTER — Other Ambulatory Visit: Payer: Self-pay

## 2021-02-13 DIAGNOSIS — E538 Deficiency of other specified B group vitamins: Secondary | ICD-10-CM

## 2021-02-13 DIAGNOSIS — R8290 Unspecified abnormal findings in urine: Secondary | ICD-10-CM | POA: Diagnosis not present

## 2021-02-13 DIAGNOSIS — R319 Hematuria, unspecified: Secondary | ICD-10-CM

## 2021-02-13 MED ORDER — CYANOCOBALAMIN 1000 MCG/ML IJ SOLN
1000.0000 ug | Freq: Once | INTRAMUSCULAR | Status: AC
  Administered 2021-02-13: 1000 ug via INTRAMUSCULAR

## 2021-02-14 ENCOUNTER — Ambulatory Visit: Payer: Medicare Other

## 2021-02-14 LAB — URINE CULTURE
MICRO NUMBER:: 12087995
Result:: NO GROWTH
SPECIMEN QUALITY:: ADEQUATE

## 2021-02-22 ENCOUNTER — Other Ambulatory Visit: Payer: Medicare Other | Admitting: Nurse Practitioner

## 2021-02-22 ENCOUNTER — Telehealth: Payer: Self-pay | Admitting: Nurse Practitioner

## 2021-02-22 ENCOUNTER — Other Ambulatory Visit: Payer: Self-pay

## 2021-02-22 VITALS — BP 140/72 | HR 75 | Resp 18

## 2021-02-22 DIAGNOSIS — Z515 Encounter for palliative care: Secondary | ICD-10-CM | POA: Diagnosis not present

## 2021-02-22 DIAGNOSIS — F0390 Unspecified dementia without behavioral disturbance: Secondary | ICD-10-CM

## 2021-02-22 DIAGNOSIS — R350 Frequency of micturition: Secondary | ICD-10-CM | POA: Diagnosis not present

## 2021-02-22 NOTE — Telephone Encounter (Signed)
Received FL2 form and [facility] Physician Supplemental form to be filled out from [place] [phone] for admission to [place] [facility]) at [facility].   Filled out and placed in [place] folder to review and sign.    To be faxed back to Fax: [phone] once completed.
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
## 2021-02-22 NOTE — Telephone Encounter (Signed)
Spoke with daughter to get clarification on what she does not want Andrew Osborne to know and she said it's fine if he knows he's getting the TB test, she just does not want anyone to tell him that he will be going to [facility].

## 2021-02-22 NOTE — Telephone Encounter (Signed)
Fwding to the clinical pool at this time

## 2021-02-22 NOTE — Telephone Encounter (Signed)
Andrew Osborne Andrew Osborne from [employer] called in regards to Andrew Osborne. He is moving to memory care and needs a TB test and his family would not like for whoever administers this test to not let the patient know why he is getting this test. If we need to speak with anyone concerning Andrew Osborne, please contact his daughter Andrew Osborne.

## 2021-02-27 ENCOUNTER — Other Ambulatory Visit: Payer: Self-pay

## 2021-02-27 ENCOUNTER — Ambulatory Visit (INDEPENDENT_AMBULATORY_CARE_PROVIDER_SITE_OTHER): Payer: Medicare Other

## 2021-02-27 DIAGNOSIS — E538 Deficiency of other specified B group vitamins: Secondary | ICD-10-CM | POA: Diagnosis not present

## 2021-02-27 DIAGNOSIS — Z111 Encounter for screening for respiratory tuberculosis: Secondary | ICD-10-CM

## 2021-02-27 MED ORDER — CYANOCOBALAMIN 1000 MCG/ML IJ SOLN
1000.0000 ug | Freq: Once | INTRAMUSCULAR | Status: AC
  Administered 2021-02-27: 1000 ug via INTRAMUSCULAR

## 2021-02-28 ENCOUNTER — Other Ambulatory Visit: Payer: Self-pay | Admitting: Nurse Practitioner

## 2021-02-28 NOTE — Telephone Encounter (Signed)
Called patient and spoke to daughter "Andrew Osborne". She states that patient is only taking 2 tablets right now. Due to stomach being upset patient is only able to tolerate 2 tablets right now. Patient daughter states that 3 tablets gave patient diarrhea. Patient daughter wants to know if its alright to just stay at the dosage she's at and not increasing. Please edit patient medication dosage and update medication list. Message routed back to PCP Dewaine Oats, Carlos American, NP. Please Advise.

## 2021-02-28 NOTE — Telephone Encounter (Signed)
Please clarify with patient how many milligram or tablets he has been taking then update med list .

## 2021-02-28 NOTE — Telephone Encounter (Signed)
Patient has request refill on medication "Namenda 5mg ". Patient PCP Sherrie Mustache, NP has documented that patient dosage will be changed. Medication pend and sent to Marlowe Sax, NP due to PCP being out of office. Please Advise.

## 2021-02-28 NOTE — Telephone Encounter (Signed)
Patient daughter "Catalina Ant[place]ua" called and notified.

## 2021-02-28 NOTE — Telephone Encounter (Signed)
May continue with two tablets if unable to tolerated

## 2021-03-05 ENCOUNTER — Other Ambulatory Visit: Payer: Medicare Other

## 2021-03-05 ENCOUNTER — Other Ambulatory Visit: Payer: Self-pay

## 2021-03-05 ENCOUNTER — Other Ambulatory Visit: Payer: Self-pay | Admitting: *Deleted

## 2021-03-05 DIAGNOSIS — F0390 Unspecified dementia without behavioral disturbance: Secondary | ICD-10-CM | POA: Diagnosis not present

## 2021-03-05 DIAGNOSIS — Z953 Presence of xenogenic heart valve: Secondary | ICD-10-CM | POA: Diagnosis not present

## 2021-03-05 DIAGNOSIS — Z111 Encounter for screening for respiratory tuberculosis: Secondary | ICD-10-CM | POA: Diagnosis not present

## 2021-03-05 DIAGNOSIS — Z789 Other specified health status: Secondary | ICD-10-CM | POA: Diagnosis not present

## 2021-03-08 LAB — QUANTIFERON-TB GOLD PLUS
Mitogen-NIL: >10 IU/mL
NIL: 0.03 IU/mL
QuantiFERON-TB Gold Plus: NEGATIVE
TB1-NIL: <0 IU/mL
TB2-NIL: 0 IU/mL

## 2021-03-11 ENCOUNTER — Ambulatory Visit (INDEPENDENT_AMBULATORY_CARE_PROVIDER_SITE_OTHER): Payer: Medicare Other | Admitting: Podiatry

## 2021-03-11 ENCOUNTER — Other Ambulatory Visit: Payer: Self-pay

## 2021-03-11 DIAGNOSIS — B351 Tinea unguium: Secondary | ICD-10-CM | POA: Diagnosis not present

## 2021-03-11 DIAGNOSIS — M79674 Pain in right toe(s): Secondary | ICD-10-CM

## 2021-03-11 DIAGNOSIS — L309 Dermatitis, unspecified: Secondary | ICD-10-CM | POA: Diagnosis not present

## 2021-03-11 DIAGNOSIS — M79675 Pain in left toe(s): Secondary | ICD-10-CM

## 2021-03-11 MED ORDER — BETAMETHASONE DIPROPIONATE 0.05 % EX CREA: TOPICAL_CREAM | Freq: Two times a day (BID) | CUTANEOUS | 2 refills | Status: DC
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
## 2021-03-13 ENCOUNTER — Encounter: Payer: Self-pay | Admitting: Nurse Practitioner

## 2021-03-13 ENCOUNTER — Other Ambulatory Visit: Payer: Self-pay | Admitting: Family

## 2021-03-14 ENCOUNTER — Ambulatory Visit (INDEPENDENT_AMBULATORY_CARE_PROVIDER_SITE_OTHER): Payer: Medicare Other

## 2021-03-14 ENCOUNTER — Telehealth (INDEPENDENT_AMBULATORY_CARE_PROVIDER_SITE_OTHER): Payer: Medicare Other | Admitting: Nurse Practitioner

## 2021-03-14 ENCOUNTER — Other Ambulatory Visit: Payer: Self-pay

## 2021-03-14 DIAGNOSIS — E538 Deficiency of other specified B group vitamins: Secondary | ICD-10-CM | POA: Diagnosis not present

## 2021-03-14 DIAGNOSIS — F5101 Primary insomnia: Secondary | ICD-10-CM

## 2021-03-14 DIAGNOSIS — K2100 Gastro-esophageal reflux disease with esophagitis, without bleeding: Secondary | ICD-10-CM | POA: Diagnosis not present

## 2021-03-14 DIAGNOSIS — F0391 Unspecified dementia with behavioral disturbance: Secondary | ICD-10-CM

## 2021-03-14 MED ORDER — TRAZODONE HCL 50 MG PO TABS: 50.0000 mg | ORAL_TABLET | Freq: Every day | ORAL | 1 refills | Status: DC

## 2021-03-14 MED ORDER — CYANOCOBALAMIN 1000 MCG/ML IJ SOLN
1000.0000 ug | Freq: Once | INTRAMUSCULAR | Status: AC
  Administered 2021-03-14: 1000 ug via INTRAMUSCULAR
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
## 2021-03-14 NOTE — Progress Notes (Signed)
This service is provided via telemedicine  No vital signs collected/recorded due to the encounter was a telemedicine visit.   Location of patient (ex: home, work):  Home  Patient consents to a telephone visit:  Yes, see encounter dated 03/14/2021  Location of the provider (ex: office, home):  Home  Name of any referring provider:  N/A  Names of all persons participating in the telemedicine service and their role in the encounter:  Sherrie Mustache, Nurse Practitioner, Carroll Kinds, CMA, and patient.    Time spent on call:  5 minutes with medical assistant

## 2021-03-15 ENCOUNTER — Telehealth: Payer: Self-pay | Admitting: *Deleted

## 2021-03-15 NOTE — Telephone Encounter (Signed)
Received a Fax from [place] regarding patient's Tums, What Strength?  Called daughter, [name], and she stated that she only gives patient's Tums when they are out eating and he gets choked up. Stated that it is very seldom he gets it.   Stated that she has stopped giving him Pepcid.   Daughter also stated that they saw Jessica yesterday and Trazodone '50mg'$  One at Bedtime was prescribed and the facility, [facility] needs an order faxed to them to add to patient's MAR.   [place] Fax: [phone] [facility])

## 2021-03-18 MED ORDER — CALCIUM CARBONATE ANTACID 500 MG PO CHEW: 2.0000 | CHEWABLE_TABLET | Freq: Four times a day (QID) | ORAL | Status: DC | PRN

## 2021-03-18 NOTE — Telephone Encounter (Signed)
Medication list updated and Encounter Printed and faxed to [facility].

## 2021-03-18 NOTE — Telephone Encounter (Signed)
Okay lets send an order to facility to  1)DC pepcid 2)tums 500 mg 2 tablets every 6 hours as needed ingestion  3)trazodone 50 mg by mouth at bedtime for sleep.  Thank you

## 2021-03-19 ENCOUNTER — Telehealth: Payer: Self-pay

## 2021-03-19 NOTE — Telephone Encounter (Signed)
His last B12 injection was on 03/14/21 and he is to get every 14 days. He is not due until 03/28/21 We will get the paperwork to them ASAP

## 2021-03-19 NOTE — Telephone Encounter (Signed)
Incoming called received from [place] with [facility] checking the status of an order request sent over on Friday 03/15/2021 for patient to receive b12 injection due on Thursday 03/21/21.  I informed Shante paperwork was received (located in [place] review and sign folder). Janett Billow was not in office yesterday or today and the earliest she can expect paperwork back is tomorrow. Shante asked that I stress the need to return orders as early as possible, to avoid a delay in patient receiving his b12 injection.

## 2021-03-20 NOTE — Telephone Encounter (Signed)
I called Shante and informed her of Jessica's response and she verbalized understanding

## 2021-03-23 ENCOUNTER — Emergency Department (HOSPITAL_COMMUNITY)
Admission: EM | Admit: 2021-03-23 | Discharge: 2021-03-23 | Disposition: A | Discharge status: Home / Self Care | Payer: Medicare Other | Attending: Emergency Medicine | Admitting: Emergency Medicine

## 2021-03-23 ENCOUNTER — Emergency Department (HOSPITAL_COMMUNITY): Payer: Medicare Other

## 2021-03-23 DIAGNOSIS — Z85820 Personal history of malignant melanoma of skin: Secondary | ICD-10-CM | POA: Insufficient documentation

## 2021-03-23 DIAGNOSIS — E860 Dehydration: Secondary | ICD-10-CM | POA: Insufficient documentation

## 2021-03-23 DIAGNOSIS — M79605 Pain in left leg: Secondary | ICD-10-CM | POA: Insufficient documentation

## 2021-03-23 DIAGNOSIS — Z043 Encounter for examination and observation following other accident: Secondary | ICD-10-CM | POA: Diagnosis not present

## 2021-03-23 DIAGNOSIS — I959 Hypotension, unspecified: Secondary | ICD-10-CM | POA: Diagnosis not present

## 2021-03-23 DIAGNOSIS — Z7401 Bed confinement status: Secondary | ICD-10-CM | POA: Diagnosis not present

## 2021-03-23 DIAGNOSIS — F0390 Unspecified dementia without behavioral disturbance: Secondary | ICD-10-CM | POA: Diagnosis not present

## 2021-03-23 DIAGNOSIS — M4312 Spondylolisthesis, cervical region: Secondary | ICD-10-CM | POA: Diagnosis not present

## 2021-03-23 DIAGNOSIS — I10 Essential (primary) hypertension: Secondary | ICD-10-CM | POA: Insufficient documentation

## 2021-03-23 DIAGNOSIS — R4182 Altered mental status, unspecified: Secondary | ICD-10-CM | POA: Diagnosis not present

## 2021-03-23 DIAGNOSIS — W19XXXA Unspecified fall, initial encounter: Secondary | ICD-10-CM | POA: Diagnosis not present

## 2021-03-23 DIAGNOSIS — M25552 Pain in left hip: Secondary | ICD-10-CM | POA: Diagnosis not present

## 2021-03-23 DIAGNOSIS — R739 Hyperglycemia, unspecified: Secondary | ICD-10-CM | POA: Diagnosis not present

## 2021-03-23 DIAGNOSIS — M47816 Spondylosis without myelopathy or radiculopathy, lumbar region: Secondary | ICD-10-CM | POA: Diagnosis not present

## 2021-03-23 DIAGNOSIS — R609 Edema, unspecified: Secondary | ICD-10-CM | POA: Diagnosis not present

## 2021-03-23 DIAGNOSIS — R531 Weakness: Secondary | ICD-10-CM

## 2021-03-23 DIAGNOSIS — M47812 Spondylosis without myelopathy or radiculopathy, cervical region: Secondary | ICD-10-CM | POA: Diagnosis not present

## 2021-03-23 DIAGNOSIS — U071 COVID-19: Secondary | ICD-10-CM

## 2021-03-23 DIAGNOSIS — R55 Syncope and collapse: Secondary | ICD-10-CM | POA: Diagnosis not present

## 2021-03-23 DIAGNOSIS — R17 Unspecified jaundice: Secondary | ICD-10-CM

## 2021-03-23 DIAGNOSIS — R220 Localized swelling, mass and lump, head: Secondary | ICD-10-CM | POA: Diagnosis not present

## 2021-03-23 DIAGNOSIS — I6381 Other cerebral infarction due to occlusion or stenosis of small artery: Secondary | ICD-10-CM | POA: Diagnosis not present

## 2021-03-23 DIAGNOSIS — G319 Degenerative disease of nervous system, unspecified: Secondary | ICD-10-CM | POA: Diagnosis not present

## 2021-03-23 LAB — CBC WITH DIFFERENTIAL/PLATELET
Abs Immature Granulocytes: 0.1 10*3/uL — ABNORMAL HIGH (ref 0.00–0.07)
Basophils Absolute: 0.1 10*3/uL (ref 0.0–0.1)
Basophils Relative: 0 %
Eosinophils Absolute: 0 10*3/uL (ref 0.0–0.5)
Eosinophils Relative: 0 %
HCT: 44.5 % (ref 39.0–52.0)
Hemoglobin: 15 g/dL (ref 13.0–17.0)
Immature Granulocytes: 1 %
Lymphocytes Relative: 3 %
Lymphs Abs: 0.3 10*3/uL — ABNORMAL LOW (ref 0.7–4.0)
MCH: 32.9 pg (ref 26.0–34.0)
MCHC: 33.7 g/dL (ref 30.0–36.0)
MCV: 97.6 fL (ref 80.0–100.0)
Monocytes Absolute: 0.8 10*3/uL (ref 0.1–1.0)
Monocytes Relative: 7 %
Neutro Abs: 10 10*3/uL — ABNORMAL HIGH (ref 1.7–7.7)
Neutrophils Relative %: 89 %
Platelets: 138 10*3/uL — ABNORMAL LOW (ref 150–400)
RBC: 4.56 MIL/uL (ref 4.22–5.81)
RDW: 14 % (ref 11.5–15.5)
WBC: 11.2 10*3/uL — ABNORMAL HIGH (ref 4.0–10.5)
nRBC: 0 % (ref 0.0–0.2)

## 2021-03-23 LAB — CBG MONITORING, ED: Glucose-Capillary: 245 mg/dL — ABNORMAL HIGH (ref 70–99)

## 2021-03-23 LAB — RESP PANEL BY RT-PCR (FLU A&B, COVID) ARPGX2
Influenza A by PCR: NEGATIVE
Influenza B by PCR: NEGATIVE
SARS Coronavirus 2 by RT PCR: POSITIVE — AB

## 2021-03-23 LAB — COMPREHENSIVE METABOLIC PANEL
ALT: 25 U/L (ref 0–44)
AST: 56 U/L — ABNORMAL HIGH (ref 15–41)
Albumin: 3.5 g/dL (ref 3.5–5.0)
Alkaline Phosphatase: 86 U/L (ref 38–126)
Anion gap: 10 (ref 5–15)
BUN: 17 mg/dL (ref 8–23)
CO2: 21 mmol/L — ABNORMAL LOW (ref 22–32)
Calcium: 8.8 mg/dL — ABNORMAL LOW (ref 8.9–10.3)
Chloride: 103 mmol/L (ref 98–111)
Creatinine, Ser: 1.53 mg/dL — ABNORMAL HIGH (ref 0.61–1.24)
GFR, Estimated: 45 mL/min — ABNORMAL LOW (ref >60)
Glucose, Bld: 250 mg/dL — ABNORMAL HIGH (ref 70–99)
Potassium: 4.1 mmol/L (ref 3.5–5.1)
Sodium: 134 mmol/L — ABNORMAL LOW (ref 135–145)
Total Bilirubin: 4.2 mg/dL — ABNORMAL HIGH (ref 0.3–1.2)
Total Protein: 6.8 g/dL (ref 6.5–8.1)

## 2021-03-23 LAB — PROTIME-INR
INR: 1.2 (ref 0.8–1.2)
Prothrombin Time: 15.1 seconds (ref 11.4–15.2)

## 2021-03-23 MED ORDER — LACTATED RINGERS IV BOLUS
1000.0000 mL | Freq: Once | INTRAVENOUS | Status: AC
  Administered 2021-03-23: 1000 mL via INTRAVENOUS

## 2021-03-23 MED ORDER — PAXLOVID 10 X 150 MG & 10 X 100MG PO TBPK: 2.0000 | ORAL_TABLET | Freq: Two times a day (BID) | ORAL | 0 refills | Status: AC

## 2021-03-23 NOTE — ED Notes (Signed)
Patients daughter updated.  
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
## 2021-03-23 NOTE — Discharge Instructions (Addendum)
Hold trazodone and tamsulosin (flomax) for 5 days while you are on paxlovid.

## 2021-03-23 NOTE — ED Triage Notes (Addendum)
[facility] care [facility]. Patient was found on floor, complaining of left hip pain. Patient appeared diaphoretic to memory care staff. Abrasion to left side of head. Left leg shortening and rotating with no pedal pulse and increasing foot swelling on LLE.  Not on thinners. Patient is at mental baseline.  EMS vitals: HR 90, BP 130/76, SPO2: 100 RA

## 2021-03-23 NOTE — ED Provider Notes (Signed)
  Physical Exam  BP 128/80   Pulse 66   Temp 98.6 F (37 C) (Oral)   Resp (!) 22   SpO2 91%   Physical Exam  ED Course/Procedures     Procedures  MDM  Presnted wth concern for fall, generalized weakness.  Bili slightly elevated from previous over 2, today 4, RUQ [place] without acute abnormality.  No other significant liver problems.   Tested positive for COVID. Has generalized weakness, however is hemodynamically stable and living in a facility and feel he is stable for outpatient management. Given rx for paxlovid, recommend holding home trazodone and tamsulosin for 5 days. Patient discharged in stable condition with understanding of reasons to return.         Gareth Morgan, MD 03/23/21 2311

## 2021-03-23 NOTE — ED Notes (Signed)
Patient aware daughter called.

## 2021-03-23 NOTE — ED Notes (Signed)
Attempted  to ambulate patient in the room , he is able to bear weight however is very weak wasn't able to stand without assistance.

## 2021-03-23 NOTE — ED Notes (Signed)
[place] pt daughter would like an update.

## 2021-03-23 NOTE — ED Notes (Signed)
Daughter called  Update given [name] [phone]

## 2021-03-23 NOTE — ED Notes (Signed)
Spoke with Edd Fabian at [facility] who states patient is able to return to [facility] and would be able to quarantine in his own apartment.

## 2021-03-25 ENCOUNTER — Encounter: Payer: Self-pay | Admitting: Nurse Practitioner

## 2021-03-28 DIAGNOSIS — F0281 Dementia in other diseases classified elsewhere with behavioral disturbance: Secondary | ICD-10-CM | POA: Diagnosis not present

## 2021-03-28 DIAGNOSIS — U071 COVID-19: Secondary | ICD-10-CM | POA: Diagnosis not present

## 2021-03-28 DIAGNOSIS — N401 Enlarged prostate with lower urinary tract symptoms: Secondary | ICD-10-CM | POA: Diagnosis not present

## 2021-03-28 DIAGNOSIS — E538 Deficiency of other specified B group vitamins: Secondary | ICD-10-CM | POA: Diagnosis not present

## 2021-03-28 DIAGNOSIS — I10 Essential (primary) hypertension: Secondary | ICD-10-CM | POA: Diagnosis not present

## 2021-03-28 DIAGNOSIS — K219 Gastro-esophageal reflux disease without esophagitis: Secondary | ICD-10-CM | POA: Diagnosis not present

## 2021-03-28 DIAGNOSIS — Z79899 Other long term (current) drug therapy: Secondary | ICD-10-CM | POA: Diagnosis not present

## 2021-03-28 DIAGNOSIS — Z9181 History of falling: Secondary | ICD-10-CM | POA: Diagnosis not present

## 2021-03-28 DIAGNOSIS — N39498 Other specified urinary incontinence: Secondary | ICD-10-CM | POA: Diagnosis not present

## 2021-03-29 ENCOUNTER — Telehealth: Payer: Self-pay | Admitting: *Deleted

## 2021-03-29 NOTE — Telephone Encounter (Signed)
Ria Comment notified and agreed.   She stated that patient did not have the cough/congestion at the visit yesterday but did state that the facility stated that he still had alittle bit of cough/congestion at times.

## 2021-03-29 NOTE — Telephone Encounter (Signed)
Noted thank you

## 2021-03-29 NOTE — Telephone Encounter (Signed)
He gets B12 every 2 weeks (bimonthly)  Okay for PT to evaluate and treat Also does he still have a cough/congestion? If so Regular strength mucinex DM by mouth twice daily (order sent over from facility for clarification but I am wondering if he still needs this)

## 2021-03-29 NOTE — Telephone Encounter (Signed)
Ria Comment with [facility] called and stated that she needs orders:  To continue BiWeekly B12 injections.   2.    PT evaluation due to fall and pain in hips.    Please Advise.

## 2021-04-01 ENCOUNTER — Ambulatory Visit: Payer: Medicare Other

## 2021-04-02 DIAGNOSIS — I10 Essential (primary) hypertension: Secondary | ICD-10-CM | POA: Diagnosis not present

## 2021-04-02 DIAGNOSIS — K219 Gastro-esophageal reflux disease without esophagitis: Secondary | ICD-10-CM | POA: Diagnosis not present

## 2021-04-02 DIAGNOSIS — F0281 Dementia in other diseases classified elsewhere with behavioral disturbance: Secondary | ICD-10-CM | POA: Diagnosis not present

## 2021-04-02 DIAGNOSIS — N39498 Other specified urinary incontinence: Secondary | ICD-10-CM | POA: Diagnosis not present

## 2021-04-02 DIAGNOSIS — N401 Enlarged prostate with lower urinary tract symptoms: Secondary | ICD-10-CM | POA: Diagnosis not present

## 2021-04-02 DIAGNOSIS — E538 Deficiency of other specified B group vitamins: Secondary | ICD-10-CM | POA: Diagnosis not present

## 2021-04-05 ENCOUNTER — Other Ambulatory Visit: Payer: Self-pay | Admitting: Nurse Practitioner

## 2021-04-05 DIAGNOSIS — F5101 Primary insomnia: Secondary | ICD-10-CM

## 2021-04-08 DIAGNOSIS — Z20828 Contact with and (suspected) exposure to other viral communicable diseases: Secondary | ICD-10-CM | POA: Diagnosis not present

## 2021-04-09 DIAGNOSIS — I10 Essential (primary) hypertension: Secondary | ICD-10-CM | POA: Diagnosis not present

## 2021-04-09 DIAGNOSIS — N401 Enlarged prostate with lower urinary tract symptoms: Secondary | ICD-10-CM | POA: Diagnosis not present

## 2021-04-09 DIAGNOSIS — N39498 Other specified urinary incontinence: Secondary | ICD-10-CM | POA: Diagnosis not present

## 2021-04-09 DIAGNOSIS — K219 Gastro-esophageal reflux disease without esophagitis: Secondary | ICD-10-CM | POA: Diagnosis not present

## 2021-04-09 DIAGNOSIS — E538 Deficiency of other specified B group vitamins: Secondary | ICD-10-CM | POA: Diagnosis not present

## 2021-04-09 DIAGNOSIS — F0281 Dementia in other diseases classified elsewhere with behavioral disturbance: Secondary | ICD-10-CM | POA: Diagnosis not present

## 2021-04-11 DIAGNOSIS — K219 Gastro-esophageal reflux disease without esophagitis: Secondary | ICD-10-CM | POA: Diagnosis not present

## 2021-04-11 DIAGNOSIS — E538 Deficiency of other specified B group vitamins: Secondary | ICD-10-CM | POA: Diagnosis not present

## 2021-04-11 DIAGNOSIS — I10 Essential (primary) hypertension: Secondary | ICD-10-CM | POA: Diagnosis not present

## 2021-04-11 DIAGNOSIS — N39498 Other specified urinary incontinence: Secondary | ICD-10-CM | POA: Diagnosis not present

## 2021-04-11 DIAGNOSIS — N401 Enlarged prostate with lower urinary tract symptoms: Secondary | ICD-10-CM | POA: Diagnosis not present

## 2021-04-11 DIAGNOSIS — F0281 Dementia in other diseases classified elsewhere with behavioral disturbance: Secondary | ICD-10-CM | POA: Diagnosis not present

## 2021-04-12 DIAGNOSIS — F0281 Dementia in other diseases classified elsewhere with behavioral disturbance: Secondary | ICD-10-CM | POA: Diagnosis not present

## 2021-04-12 DIAGNOSIS — N401 Enlarged prostate with lower urinary tract symptoms: Secondary | ICD-10-CM | POA: Diagnosis not present

## 2021-04-12 DIAGNOSIS — I10 Essential (primary) hypertension: Secondary | ICD-10-CM | POA: Diagnosis not present

## 2021-04-12 DIAGNOSIS — E538 Deficiency of other specified B group vitamins: Secondary | ICD-10-CM | POA: Diagnosis not present

## 2021-04-12 DIAGNOSIS — N39498 Other specified urinary incontinence: Secondary | ICD-10-CM | POA: Diagnosis not present

## 2021-04-12 DIAGNOSIS — K219 Gastro-esophageal reflux disease without esophagitis: Secondary | ICD-10-CM | POA: Diagnosis not present

## 2021-04-19 DIAGNOSIS — N39498 Other specified urinary incontinence: Secondary | ICD-10-CM | POA: Diagnosis not present

## 2021-04-19 DIAGNOSIS — I10 Essential (primary) hypertension: Secondary | ICD-10-CM | POA: Diagnosis not present

## 2021-04-19 DIAGNOSIS — N401 Enlarged prostate with lower urinary tract symptoms: Secondary | ICD-10-CM | POA: Diagnosis not present

## 2021-04-19 DIAGNOSIS — F0281 Dementia in other diseases classified elsewhere with behavioral disturbance: Secondary | ICD-10-CM | POA: Diagnosis not present

## 2021-04-19 DIAGNOSIS — E538 Deficiency of other specified B group vitamins: Secondary | ICD-10-CM | POA: Diagnosis not present

## 2021-04-19 DIAGNOSIS — K219 Gastro-esophageal reflux disease without esophagitis: Secondary | ICD-10-CM | POA: Diagnosis not present

## 2021-04-22 DIAGNOSIS — Z20828 Contact with and (suspected) exposure to other viral communicable diseases: Secondary | ICD-10-CM | POA: Diagnosis not present

## 2021-04-24 DIAGNOSIS — Z23 Encounter for immunization: Secondary | ICD-10-CM | POA: Diagnosis not present

## 2021-04-25 DIAGNOSIS — N39498 Other specified urinary incontinence: Secondary | ICD-10-CM | POA: Diagnosis not present

## 2021-04-25 DIAGNOSIS — K219 Gastro-esophageal reflux disease without esophagitis: Secondary | ICD-10-CM | POA: Diagnosis not present

## 2021-04-25 DIAGNOSIS — I10 Essential (primary) hypertension: Secondary | ICD-10-CM | POA: Diagnosis not present

## 2021-04-25 DIAGNOSIS — F0281 Dementia in other diseases classified elsewhere with behavioral disturbance: Secondary | ICD-10-CM | POA: Diagnosis not present

## 2021-04-25 DIAGNOSIS — N401 Enlarged prostate with lower urinary tract symptoms: Secondary | ICD-10-CM | POA: Diagnosis not present

## 2021-04-25 DIAGNOSIS — E538 Deficiency of other specified B group vitamins: Secondary | ICD-10-CM | POA: Diagnosis not present

## 2021-04-27 DIAGNOSIS — K219 Gastro-esophageal reflux disease without esophagitis: Secondary | ICD-10-CM | POA: Diagnosis not present

## 2021-04-27 DIAGNOSIS — I10 Essential (primary) hypertension: Secondary | ICD-10-CM | POA: Diagnosis not present

## 2021-04-27 DIAGNOSIS — F0281 Dementia in other diseases classified elsewhere with behavioral disturbance: Secondary | ICD-10-CM | POA: Diagnosis not present

## 2021-04-27 DIAGNOSIS — Z9181 History of falling: Secondary | ICD-10-CM | POA: Diagnosis not present

## 2021-04-27 DIAGNOSIS — Z79899 Other long term (current) drug therapy: Secondary | ICD-10-CM | POA: Diagnosis not present

## 2021-04-27 DIAGNOSIS — E538 Deficiency of other specified B group vitamins: Secondary | ICD-10-CM | POA: Diagnosis not present

## 2021-04-27 DIAGNOSIS — N401 Enlarged prostate with lower urinary tract symptoms: Secondary | ICD-10-CM | POA: Diagnosis not present

## 2021-04-27 DIAGNOSIS — U071 COVID-19: Secondary | ICD-10-CM | POA: Diagnosis not present

## 2021-04-27 DIAGNOSIS — N39498 Other specified urinary incontinence: Secondary | ICD-10-CM | POA: Diagnosis not present

## 2021-05-02 DIAGNOSIS — Z8616 Personal history of COVID-19: Secondary | ICD-10-CM | POA: Diagnosis not present

## 2021-05-03 ENCOUNTER — Ambulatory Visit (INDEPENDENT_AMBULATORY_CARE_PROVIDER_SITE_OTHER): Payer: Medicare Other | Admitting: Nurse Practitioner

## 2021-05-03 ENCOUNTER — Encounter: Payer: Self-pay | Admitting: Nurse Practitioner

## 2021-05-03 ENCOUNTER — Other Ambulatory Visit: Payer: Self-pay

## 2021-05-03 VITALS — BP 114/64 | HR 75 | Temp 97.8°F | Ht 66.0 in | Wt 147.0 lb

## 2021-05-03 DIAGNOSIS — R32 Unspecified urinary incontinence: Secondary | ICD-10-CM

## 2021-05-03 DIAGNOSIS — R634 Abnormal weight loss: Secondary | ICD-10-CM

## 2021-05-03 DIAGNOSIS — S30810A Abrasion of lower back and pelvis, initial encounter: Secondary | ICD-10-CM

## 2021-05-03 DIAGNOSIS — R300 Dysuria: Secondary | ICD-10-CM | POA: Diagnosis not present

## 2021-05-03 LAB — POCT URINALYSIS DIPSTICK
Bilirubin, UA: NEGATIVE
Blood, UA: POSITIVE
Glucose, UA: NEGATIVE
Ketones, UA: POSITIVE
Nitrite, UA: NEGATIVE
Protein, UA: POSITIVE — AB
Spec Grav, UA: >=1.03 — AB (ref 1.010–1.025)
Urobilinogen, UA: NEGATIVE E.U./dL — AB
pH, UA: 6 (ref 5.0–8.0)

## 2021-05-03 MED ORDER — CIPROFLOXACIN HCL 500 MG PO TABS: 500.0000 mg | ORAL_TABLET | Freq: Two times a day (BID) | ORAL | 0 refills | Status: AC

## 2021-05-03 MED ORDER — ENSURE NUTRA SHAKE HI-CAL PO LIQD: ORAL | 3 refills | Status: DC
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
## 2021-05-03 NOTE — Patient Instructions (Signed)
Increase water intake- push hydration.   Make sure you are eating 3 meals a day. Add ensure daily (in combination with a meal does not replace meal)   To wear brief at this time- incontinent of urine and needs assistance with cleaning after bowel movements.   To apply barrier cream as needed   Make sure he is turning/moving every 2 hours.

## 2021-05-04 LAB — URINE CULTURE
MICRO NUMBER:: 12416198
SPECIMEN QUALITY:: ADEQUATE

## 2021-05-06 ENCOUNTER — Ambulatory Visit: Payer: Medicare Other | Admitting: Nurse Practitioner

## 2021-05-09 DIAGNOSIS — N401 Enlarged prostate with lower urinary tract symptoms: Secondary | ICD-10-CM | POA: Diagnosis not present

## 2021-05-09 DIAGNOSIS — N39498 Other specified urinary incontinence: Secondary | ICD-10-CM | POA: Diagnosis not present

## 2021-05-09 DIAGNOSIS — I10 Essential (primary) hypertension: Secondary | ICD-10-CM | POA: Diagnosis not present

## 2021-05-09 DIAGNOSIS — Z8616 Personal history of COVID-19: Secondary | ICD-10-CM | POA: Diagnosis not present

## 2021-05-09 DIAGNOSIS — K219 Gastro-esophageal reflux disease without esophagitis: Secondary | ICD-10-CM | POA: Diagnosis not present

## 2021-05-09 DIAGNOSIS — F0281 Dementia in other diseases classified elsewhere with behavioral disturbance: Secondary | ICD-10-CM | POA: Diagnosis not present

## 2021-05-09 DIAGNOSIS — E538 Deficiency of other specified B group vitamins: Secondary | ICD-10-CM | POA: Diagnosis not present

## 2021-05-13 ENCOUNTER — Encounter: Payer: Self-pay | Admitting: Nurse Practitioner

## 2021-05-14 DIAGNOSIS — Z8616 Personal history of COVID-19: Secondary | ICD-10-CM | POA: Diagnosis not present

## 2021-05-16 DIAGNOSIS — Z8616 Personal history of COVID-19: Secondary | ICD-10-CM | POA: Diagnosis not present

## 2021-05-20 DIAGNOSIS — Z20828 Contact with and (suspected) exposure to other viral communicable diseases: Secondary | ICD-10-CM | POA: Diagnosis not present

## 2021-05-22 DIAGNOSIS — K219 Gastro-esophageal reflux disease without esophagitis: Secondary | ICD-10-CM | POA: Diagnosis not present

## 2021-05-22 DIAGNOSIS — N401 Enlarged prostate with lower urinary tract symptoms: Secondary | ICD-10-CM | POA: Diagnosis not present

## 2021-05-22 DIAGNOSIS — I10 Essential (primary) hypertension: Secondary | ICD-10-CM | POA: Diagnosis not present

## 2021-05-22 DIAGNOSIS — E538 Deficiency of other specified B group vitamins: Secondary | ICD-10-CM | POA: Diagnosis not present

## 2021-05-22 DIAGNOSIS — F0281 Dementia in other diseases classified elsewhere with behavioral disturbance: Secondary | ICD-10-CM | POA: Diagnosis not present

## 2021-05-22 DIAGNOSIS — N39498 Other specified urinary incontinence: Secondary | ICD-10-CM | POA: Diagnosis not present

## 2021-05-24 DIAGNOSIS — H524 Presbyopia: Secondary | ICD-10-CM | POA: Diagnosis not present

## 2021-05-24 DIAGNOSIS — H2511 Age-related nuclear cataract, right eye: Secondary | ICD-10-CM | POA: Diagnosis not present

## 2021-05-27 DIAGNOSIS — K219 Gastro-esophageal reflux disease without esophagitis: Secondary | ICD-10-CM | POA: Diagnosis not present

## 2021-05-27 DIAGNOSIS — Z9181 History of falling: Secondary | ICD-10-CM | POA: Diagnosis not present

## 2021-05-27 DIAGNOSIS — F02818 Dementia in other diseases classified elsewhere, unspecified severity, with other behavioral disturbance: Secondary | ICD-10-CM | POA: Diagnosis not present

## 2021-05-27 DIAGNOSIS — I10 Essential (primary) hypertension: Secondary | ICD-10-CM | POA: Diagnosis not present

## 2021-05-27 DIAGNOSIS — F02811 Dementia in other diseases classified elsewhere, unspecified severity, with agitation: Secondary | ICD-10-CM | POA: Diagnosis not present

## 2021-05-27 DIAGNOSIS — N39498 Other specified urinary incontinence: Secondary | ICD-10-CM | POA: Diagnosis not present

## 2021-05-27 DIAGNOSIS — E538 Deficiency of other specified B group vitamins: Secondary | ICD-10-CM | POA: Diagnosis not present

## 2021-05-27 DIAGNOSIS — U071 COVID-19: Secondary | ICD-10-CM | POA: Diagnosis not present

## 2021-05-27 DIAGNOSIS — N401 Enlarged prostate with lower urinary tract symptoms: Secondary | ICD-10-CM | POA: Diagnosis not present

## 2021-05-27 DIAGNOSIS — Z79899 Other long term (current) drug therapy: Secondary | ICD-10-CM | POA: Diagnosis not present

## 2021-05-31 ENCOUNTER — Other Ambulatory Visit: Payer: Self-pay

## 2021-05-31 ENCOUNTER — Ambulatory Visit (INDEPENDENT_AMBULATORY_CARE_PROVIDER_SITE_OTHER): Payer: Medicare Other | Admitting: Nurse Practitioner

## 2021-05-31 ENCOUNTER — Encounter: Payer: Self-pay | Admitting: Nurse Practitioner

## 2021-05-31 VITALS — BP 114/78 | HR 71 | Temp 97.7°F | Ht 66.0 in | Wt 149.0 lb

## 2021-05-31 DIAGNOSIS — R634 Abnormal weight loss: Secondary | ICD-10-CM

## 2021-05-31 DIAGNOSIS — K2100 Gastro-esophageal reflux disease with esophagitis, without bleeding: Secondary | ICD-10-CM

## 2021-05-31 DIAGNOSIS — Z66 Do not resuscitate: Secondary | ICD-10-CM | POA: Diagnosis not present

## 2021-05-31 DIAGNOSIS — N390 Urinary tract infection, site not specified: Secondary | ICD-10-CM

## 2021-05-31 DIAGNOSIS — N401 Enlarged prostate with lower urinary tract symptoms: Secondary | ICD-10-CM | POA: Diagnosis not present

## 2021-05-31 DIAGNOSIS — F5101 Primary insomnia: Secondary | ICD-10-CM

## 2021-05-31 DIAGNOSIS — R32 Unspecified urinary incontinence: Secondary | ICD-10-CM | POA: Diagnosis not present
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
## 2021-06-05 DIAGNOSIS — F02818 Dementia in other diseases classified elsewhere, unspecified severity, with other behavioral disturbance: Secondary | ICD-10-CM | POA: Diagnosis not present

## 2021-06-05 DIAGNOSIS — N39498 Other specified urinary incontinence: Secondary | ICD-10-CM | POA: Diagnosis not present

## 2021-06-05 DIAGNOSIS — N401 Enlarged prostate with lower urinary tract symptoms: Secondary | ICD-10-CM | POA: Diagnosis not present

## 2021-06-05 DIAGNOSIS — K219 Gastro-esophageal reflux disease without esophagitis: Secondary | ICD-10-CM | POA: Diagnosis not present

## 2021-06-05 DIAGNOSIS — I10 Essential (primary) hypertension: Secondary | ICD-10-CM | POA: Diagnosis not present

## 2021-06-05 DIAGNOSIS — E538 Deficiency of other specified B group vitamins: Secondary | ICD-10-CM | POA: Diagnosis not present

## 2021-06-10 ENCOUNTER — Other Ambulatory Visit: Payer: Self-pay

## 2021-06-10 ENCOUNTER — Encounter: Payer: Self-pay | Admitting: Nurse Practitioner

## 2021-06-10 ENCOUNTER — Ambulatory Visit (INDEPENDENT_AMBULATORY_CARE_PROVIDER_SITE_OTHER): Payer: Medicare Other | Admitting: Nurse Practitioner

## 2021-06-10 DIAGNOSIS — Z0000 Encounter for general adult medical examination without abnormal findings: Secondary | ICD-10-CM

## 2021-06-10 DIAGNOSIS — Z7185 Encounter for immunization safety counseling: Secondary | ICD-10-CM | POA: Diagnosis not present
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
## 2021-06-10 NOTE — Progress Notes (Signed)
   This service is provided via telemedicine  No vital signs collected/recorded due to the encounter was a telemedicine visit.   Location of patient (ex: home, work):  Home  Patient consents to a telephone visit: Yes, see telephone visit dated 06/10/21  Location of the provider (ex: office, home):  [facility] Office   Name of any referring provider:  N/A  Names of all persons participating in the telemedicine service and their role in the encounter:  S.Chrae B/CMA, Sherrie Mustache, NP, Scott (son-in law) and Patient   Time spent on call:  9 min with medical assistant

## 2021-06-10 NOTE — Patient Instructions (Addendum)
Andrew Osborne , Thank you for taking time to come for your Medicare Wellness Visit. I appreciate your ongoing commitment to your health goals. Please review the following plan we discussed and let me know if I can assist you in the future.   Screening recommendations/referrals: Colonoscopy aged out Recommended yearly ophthalmology/optometry visit for glaucoma screening and checkup Recommended yearly dental visit for hygiene and checkup  Vaccinations: Influenza vaccine up to date Pneumococcal vaccine up to date Tdap vaccine up to date Shingles vaccine up to date   COVID Booster- recommended to get at local pharmacy when able.   Advanced directives: on file.   Conditions/risks identified: progressive memory loss, fall risk, advance aged  Next appointment: yearly for AWV  Preventive Care 45 Years and Older, Male Preventive care refers to lifestyle choices and visits with your health care provider that can promote health and wellness. What does preventive care include? A yearly physical exam. This is also called an annual well check. Dental exams once or twice a year. Routine eye exams. Ask your health care provider how often you should have your eyes checked. Personal lifestyle choices, including: Daily care of your teeth and gums. Regular physical activity. Eating a healthy diet. Avoiding tobacco and drug use. Limiting alcohol use. Practicing safe sex. Taking low doses of aspirin every day. Taking vitamin and mineral supplements as recommended by your health care provider. What happens during an annual well check? The services and screenings done by your health care provider during your annual well check will depend on your age, overall health, lifestyle risk factors, and family history of disease. Counseling  Your health care provider may ask you questions about your: Alcohol use. Tobacco use. Drug use. Emotional well-being. Home and relationship well-being. Sexual  activity. Eating habits. History of falls. Memory and ability to understand (cognition). Work and work Statistician. Screening  You may have the following tests or measurements: Height, weight, and BMI. Blood pressure. Lipid and cholesterol levels. These may be checked every 5 years, or more frequently if you are over 42 years old. Skin check. Lung cancer screening. You may have this screening every year starting at age 8 if you have a 30-pack-year history of smoking and currently smoke or have quit within the past 15 years. Fecal occult blood test (FOBT) of the stool. You may have this test every year starting at age 7. Flexible sigmoidoscopy or colonoscopy. You may have a sigmoidoscopy every 5 years or a colonoscopy every 10 years starting at age 70. Prostate cancer screening. Recommendations will vary depending on your family history and other risks. Hepatitis C blood test. Hepatitis B blood test. Sexually transmitted disease (STD) testing. Diabetes screening. This is done by checking your blood sugar (glucose) after you have not eaten for a while (fasting). You may have this done every 1-3 years. Abdominal aortic aneurysm (AAA) screening. You may need this if you are a current or former smoker. Osteoporosis. You may be screened starting at age 62 if you are at high risk. Talk with your health care provider about your test results, treatment options, and if necessary, the need for more tests. Vaccines  Your health care provider may recommend certain vaccines, such as: Influenza vaccine. This is recommended every year. Tetanus, diphtheria, and acellular pertussis (Tdap, Td) vaccine. You may need a Td booster every 10 years. Zoster vaccine. You may need this after age 34. Pneumococcal 13-valent conjugate (PCV13) vaccine. One dose is recommended after age 18. Pneumococcal polysaccharide (PPSV23) vaccine. One  dose is recommended after age 15. Talk to your health care provider about which  screenings and vaccines you need and how often you need them. This information is not intended to replace advice given to you by your health care provider. Make sure you discuss any questions you have with your health care provider. Document Released: 08/24/2015 Document Revised: 04/16/2016 Document Reviewed: 05/29/2015 Elsevier Interactive Patient Education  2017 [place] Prevention in the Home Falls can cause injuries. They can happen to people of all ages. There are many things you can do to make your home safe and to help prevent falls. What can I do on the outside of my home? Regularly fix the edges of walkways and driveways and fix any cracks. Remove anything that might make you trip as you walk through a door, such as a raised step or threshold. Trim any bushes or trees on the path to your home. Use bright outdoor lighting. Clear any walking paths of anything that might make someone trip, such as rocks or tools. Regularly check to see if handrails are loose or broken. Make sure that both sides of any steps have handrails. Any raised decks and porches should have guardrails on the edges. Have any leaves, snow, or ice cleared regularly. Use sand or salt on walking paths during winter. Clean up any spills in your garage right away. This includes oil or grease spills. What can I do in the bathroom? Use night lights. Install grab bars by the toilet and in the tub and shower. Do not use towel bars as grab bars. Use non-skid mats or decals in the tub or shower. If you need to sit down in the shower, use a plastic, non-slip stool. Keep the floor dry. Clean up any water that spills on the floor as soon as it happens. Remove soap buildup in the tub or shower regularly. Attach bath mats securely with double-sided non-slip rug tape. Do not have throw rugs and other things on the floor that can make you trip. What can I do in the bedroom? Use night lights. Make sure that you have a  light by your bed that is easy to reach. Do not use any sheets or blankets that are too big for your bed. They should not hang down onto the floor. Have a firm chair that has side arms. You can use this for support while you get dressed. Do not have throw rugs and other things on the floor that can make you trip. What can I do in the kitchen? Clean up any spills right away. Avoid walking on wet floors. Keep items that you use a lot in easy-to-reach places. If you need to reach something above you, use a strong step stool that has a grab bar. Keep electrical cords out of the way. Do not use floor polish or wax that makes floors slippery. If you must use wax, use non-skid floor wax. Do not have throw rugs and other things on the floor that can make you trip. What can I do with my stairs? Do not leave any items on the stairs. Make sure that there are handrails on both sides of the stairs and use them. Fix handrails that are broken or loose. Make sure that handrails are as long as the stairways. Check any carpeting to make sure that it is firmly attached to the stairs. Fix any carpet that is loose or worn. Avoid having throw rugs at the top or bottom of the  stairs. If you do have throw rugs, attach them to the floor with carpet tape. Make sure that you have a light switch at the top of the stairs and the bottom of the stairs. If you do not have them, ask someone to add them for you. What else can I do to help prevent falls? Wear shoes that: Do not have high heels. Have rubber bottoms. Are comfortable and fit you well. Are closed at the toe. Do not wear sandals. If you use a stepladder: Make sure that it is fully opened. Do not climb a closed stepladder. Make sure that both sides of the stepladder are locked into place. Ask someone to hold it for you, if possible. Clearly mark and make sure that you can see: Any grab bars or handrails. First and last steps. Where the edge of each step  is. Use tools that help you move around (mobility aids) if they are needed. These include: Canes. Walkers. Scooters. Crutches. Turn on the lights when you go into a dark area. Replace any light bulbs as soon as they burn out. Set up your furniture so you have a clear path. Avoid moving your furniture around. If any of your floors are uneven, fix them. If there are any pets around you, be aware of where they are. Review your medicines with your doctor. Some medicines can make you feel dizzy. This can increase your chance of falling. Ask your doctor what other things that you can do to help prevent falls. This information is not intended to replace advice given to you by your health care provider. Make sure you discuss any questions you have with your health care provider. Document Released: 05/24/2009 Document Revised: 01/03/2016 Document Reviewed: 09/01/2014 Elsevier Interactive Patient Education  2017 [employer].

## 2021-06-11 ENCOUNTER — Encounter: Payer: Self-pay | Admitting: Podiatry

## 2021-06-11 ENCOUNTER — Ambulatory Visit (INDEPENDENT_AMBULATORY_CARE_PROVIDER_SITE_OTHER): Payer: Medicare Other | Admitting: Podiatry

## 2021-06-11 ENCOUNTER — Other Ambulatory Visit: Payer: Self-pay

## 2021-06-11 DIAGNOSIS — M79674 Pain in right toe(s): Secondary | ICD-10-CM | POA: Diagnosis not present

## 2021-06-11 DIAGNOSIS — B351 Tinea unguium: Secondary | ICD-10-CM | POA: Diagnosis not present

## 2021-06-11 DIAGNOSIS — M79675 Pain in left toe(s): Secondary | ICD-10-CM | POA: Diagnosis not present

## 2021-06-11 NOTE — Progress Notes (Signed)
  Subjective:  Patient ID: Andrew Osborne, male    DOB: 09-19-1940,  MRN: [id number]  Andrew Osborne presents to clinic today for painful thick toenails that are difficult to trim. Pain interferes with ambulation. Aggravating factors include wearing enclosed shoe gear. Pain is relieved with periodic professional debridement.  Patient is accompanied by his son in law. Son in law states patient has had a rash on both feet which were addressed by Dr. Amalia Hailey on last visit. He was given Rx for Betamethasone Cream 0.05% to be applied as needed.  They relate no other pedal problems on today's visit.  Andrew Osborne resides at [facility].  PCP is Andrew Chandler, NP , and last visit was 05/31/2021.  Allergies  Allergen Reactions   Nizoral [Ketoconazole] Other (See Comments)    "Made fluid come through skin"  (was a combination of this drug w/ another drug pt can't remember)    Review of Systems: Negative except as noted in the HPI. Objective:   Constitutional Andrew Osborne is a pleasant 80 y.o. Caucasian male, WD, WN in NAD. AAO x 3.   Vascular CFT immediate b/l LE. Palpable DP/PT pulses b/l LE. Digital hair sparse b/l. Skin temperature gradient WNL b/l. No pain with calf compression b/l. No edema noted b/l. Lower extremity skin temperature gradient within normal limits. No cyanosis or clubbing noted.  Neurologic Normal speech. Oriented to person, place, and time. Protective sensation intact 5/5 intact bilaterally with 10g monofilament b/l. Vibratory sensation intact b/l.  Dermatologic Pedal integument with normal turgor, texture and tone b/l LE. No open wounds b/l. No interdigital macerations b/l. Toenails 1-5 b/l elongated, thickened, discolored with subungual debris. +Tenderness with dorsal palpation of nailplates. No hyperkeratotic or porokeratotic lesions present. Toenails 1-5 b/l elongated, discolored, dystrophic, thickened, crumbly with subungual debris and  tenderness to dorsal palpation.  Orthopedic: Normal muscle strength 5/5 to all lower extremity muscle groups bilaterally.   Radiographs: None Assessment:   1. Pain due to onychomycosis of toenails of both feet    Plan:  Patient was evaluated and treated and all questions answered. Consent given for treatment as described below: -Examined patient. -No new findings. No new orders. -Toenails 1-5 b/l were debrided in length and girth with sterile nail nippers and dremel without iatrogenic bleeding.  -Patient/POA to call should there be question/concern in the interim.  Return in about 3 months (around 09/11/2021).  Marzetta Board, DPM

## 2021-06-17 ENCOUNTER — Telehealth: Payer: Self-pay | Admitting: *Deleted

## 2021-06-17 NOTE — Telephone Encounter (Signed)
Amy with [facility] called and stated that the facility thinks the patient would benefit from PT.   Wanting verbal order to add PT along with the SN.  Wants verbal orders to evaluate and treat.   Please Advise.

## 2021-06-17 NOTE — Telephone Encounter (Signed)
Okay to add PT/OT

## 2021-06-17 NOTE — Telephone Encounter (Signed)
Verbal Order given to Amy.

## 2021-06-19 DIAGNOSIS — I10 Essential (primary) hypertension: Secondary | ICD-10-CM | POA: Diagnosis not present

## 2021-06-19 DIAGNOSIS — N401 Enlarged prostate with lower urinary tract symptoms: Secondary | ICD-10-CM | POA: Diagnosis not present

## 2021-06-19 DIAGNOSIS — N39498 Other specified urinary incontinence: Secondary | ICD-10-CM | POA: Diagnosis not present

## 2021-06-19 DIAGNOSIS — K219 Gastro-esophageal reflux disease without esophagitis: Secondary | ICD-10-CM | POA: Diagnosis not present

## 2021-06-19 DIAGNOSIS — F02818 Dementia in other diseases classified elsewhere, unspecified severity, with other behavioral disturbance: Secondary | ICD-10-CM | POA: Diagnosis not present

## 2021-06-19 DIAGNOSIS — E538 Deficiency of other specified B group vitamins: Secondary | ICD-10-CM | POA: Diagnosis not present

## 2021-06-21 DIAGNOSIS — E538 Deficiency of other specified B group vitamins: Secondary | ICD-10-CM | POA: Diagnosis not present

## 2021-06-21 DIAGNOSIS — N401 Enlarged prostate with lower urinary tract symptoms: Secondary | ICD-10-CM | POA: Diagnosis not present

## 2021-06-21 DIAGNOSIS — I10 Essential (primary) hypertension: Secondary | ICD-10-CM | POA: Diagnosis not present

## 2021-06-21 DIAGNOSIS — F02818 Dementia in other diseases classified elsewhere, unspecified severity, with other behavioral disturbance: Secondary | ICD-10-CM | POA: Diagnosis not present

## 2021-06-21 DIAGNOSIS — K219 Gastro-esophageal reflux disease without esophagitis: Secondary | ICD-10-CM | POA: Diagnosis not present

## 2021-06-21 DIAGNOSIS — N39498 Other specified urinary incontinence: Secondary | ICD-10-CM | POA: Diagnosis not present

## 2021-06-24 ENCOUNTER — Telehealth: Payer: Self-pay | Admitting: *Deleted

## 2021-06-24 NOTE — Telephone Encounter (Signed)
Andrew Osborne with [facility] called and wanted verbal order for Home Health PT 1X6weeks.  Verbal orders given.

## 2021-06-26 DIAGNOSIS — K219 Gastro-esophageal reflux disease without esophagitis: Secondary | ICD-10-CM | POA: Diagnosis not present

## 2021-06-26 DIAGNOSIS — E538 Deficiency of other specified B group vitamins: Secondary | ICD-10-CM | POA: Diagnosis not present

## 2021-06-26 DIAGNOSIS — I10 Essential (primary) hypertension: Secondary | ICD-10-CM | POA: Diagnosis not present

## 2021-06-26 DIAGNOSIS — F02818 Dementia in other diseases classified elsewhere, unspecified severity, with other behavioral disturbance: Secondary | ICD-10-CM | POA: Diagnosis not present

## 2021-06-26 DIAGNOSIS — Z9181 History of falling: Secondary | ICD-10-CM | POA: Diagnosis not present

## 2021-06-26 DIAGNOSIS — U071 COVID-19: Secondary | ICD-10-CM | POA: Diagnosis not present

## 2021-06-26 DIAGNOSIS — N401 Enlarged prostate with lower urinary tract symptoms: Secondary | ICD-10-CM | POA: Diagnosis not present

## 2021-06-26 DIAGNOSIS — N39498 Other specified urinary incontinence: Secondary | ICD-10-CM | POA: Diagnosis not present

## 2021-06-26 DIAGNOSIS — Z79899 Other long term (current) drug therapy: Secondary | ICD-10-CM | POA: Diagnosis not present

## 2021-06-27 ENCOUNTER — Telehealth: Payer: Self-pay

## 2021-06-27 NOTE — Telephone Encounter (Signed)
Incoming call received from [facility] with [facility] stating patient will have a missed visit today, as he refused therapy stating he was busy talking to other residents in the memory care unit at [facility].  FYI

## 2021-07-02 DIAGNOSIS — H1033 Unspecified acute conjunctivitis, bilateral: Secondary | ICD-10-CM | POA: Diagnosis not present

## 2021-07-02 DIAGNOSIS — H02105 Unspecified ectropion of left lower eyelid: Secondary | ICD-10-CM | POA: Diagnosis not present

## 2021-07-02 DIAGNOSIS — H02102 Unspecified ectropion of right lower eyelid: Secondary | ICD-10-CM | POA: Diagnosis not present

## 2021-07-03 ENCOUNTER — Telehealth: Payer: Self-pay | Admitting: *Deleted

## 2021-07-03 DIAGNOSIS — E538 Deficiency of other specified B group vitamins: Secondary | ICD-10-CM | POA: Diagnosis not present

## 2021-07-03 DIAGNOSIS — F02818 Dementia in other diseases classified elsewhere, unspecified severity, with other behavioral disturbance: Secondary | ICD-10-CM | POA: Diagnosis not present

## 2021-07-03 DIAGNOSIS — N39498 Other specified urinary incontinence: Secondary | ICD-10-CM | POA: Diagnosis not present

## 2021-07-03 DIAGNOSIS — I10 Essential (primary) hypertension: Secondary | ICD-10-CM | POA: Diagnosis not present

## 2021-07-03 DIAGNOSIS — N401 Enlarged prostate with lower urinary tract symptoms: Secondary | ICD-10-CM | POA: Diagnosis not present

## 2021-07-03 DIAGNOSIS — K219 Gastro-esophageal reflux disease without esophagitis: Secondary | ICD-10-CM | POA: Diagnosis not present

## 2021-07-03 NOTE — Telephone Encounter (Signed)
Mora with [facility] called requesting verbal orders for OT 1x8weeks.  Verbal Orders given.

## 2021-07-03 NOTE — Telephone Encounter (Signed)
Deidre Ala with [facility] called and stated that patient had an appointment today and refused therapy and was rude.  Stated that he has spoken with the family regarding this also.   FYI

## 2021-07-11 DIAGNOSIS — E538 Deficiency of other specified B group vitamins: Secondary | ICD-10-CM | POA: Diagnosis not present

## 2021-07-11 DIAGNOSIS — I10 Essential (primary) hypertension: Secondary | ICD-10-CM | POA: Diagnosis not present

## 2021-07-11 DIAGNOSIS — K219 Gastro-esophageal reflux disease without esophagitis: Secondary | ICD-10-CM | POA: Diagnosis not present

## 2021-07-11 DIAGNOSIS — F02818 Dementia in other diseases classified elsewhere, unspecified severity, with other behavioral disturbance: Secondary | ICD-10-CM | POA: Diagnosis not present

## 2021-07-11 DIAGNOSIS — N39498 Other specified urinary incontinence: Secondary | ICD-10-CM | POA: Diagnosis not present

## 2021-07-11 DIAGNOSIS — N401 Enlarged prostate with lower urinary tract symptoms: Secondary | ICD-10-CM | POA: Diagnosis not present

## 2021-07-12 DIAGNOSIS — H02102 Unspecified ectropion of right lower eyelid: Secondary | ICD-10-CM | POA: Diagnosis not present

## 2021-07-17 DIAGNOSIS — K219 Gastro-esophageal reflux disease without esophagitis: Secondary | ICD-10-CM | POA: Diagnosis not present

## 2021-07-17 DIAGNOSIS — N401 Enlarged prostate with lower urinary tract symptoms: Secondary | ICD-10-CM | POA: Diagnosis not present

## 2021-07-17 DIAGNOSIS — E538 Deficiency of other specified B group vitamins: Secondary | ICD-10-CM | POA: Diagnosis not present

## 2021-07-17 DIAGNOSIS — F02818 Dementia in other diseases classified elsewhere, unspecified severity, with other behavioral disturbance: Secondary | ICD-10-CM | POA: Diagnosis not present

## 2021-07-17 DIAGNOSIS — N39498 Other specified urinary incontinence: Secondary | ICD-10-CM | POA: Diagnosis not present

## 2021-07-17 DIAGNOSIS — I10 Essential (primary) hypertension: Secondary | ICD-10-CM | POA: Diagnosis not present

## 2021-07-19 DIAGNOSIS — I10 Essential (primary) hypertension: Secondary | ICD-10-CM | POA: Diagnosis not present

## 2021-07-19 DIAGNOSIS — F02818 Dementia in other diseases classified elsewhere, unspecified severity, with other behavioral disturbance: Secondary | ICD-10-CM | POA: Diagnosis not present

## 2021-07-19 DIAGNOSIS — N401 Enlarged prostate with lower urinary tract symptoms: Secondary | ICD-10-CM | POA: Diagnosis not present

## 2021-07-19 DIAGNOSIS — N39498 Other specified urinary incontinence: Secondary | ICD-10-CM | POA: Diagnosis not present

## 2021-07-19 DIAGNOSIS — K219 Gastro-esophageal reflux disease without esophagitis: Secondary | ICD-10-CM | POA: Diagnosis not present

## 2021-07-19 DIAGNOSIS — E538 Deficiency of other specified B group vitamins: Secondary | ICD-10-CM | POA: Diagnosis not present

## 2021-07-22 ENCOUNTER — Telehealth: Payer: Self-pay | Admitting: *Deleted

## 2021-07-22 DIAGNOSIS — N401 Enlarged prostate with lower urinary tract symptoms: Secondary | ICD-10-CM | POA: Diagnosis not present

## 2021-07-22 DIAGNOSIS — F02818 Dementia in other diseases classified elsewhere, unspecified severity, with other behavioral disturbance: Secondary | ICD-10-CM | POA: Diagnosis not present

## 2021-07-22 DIAGNOSIS — K219 Gastro-esophageal reflux disease without esophagitis: Secondary | ICD-10-CM | POA: Diagnosis not present

## 2021-07-22 DIAGNOSIS — I10 Essential (primary) hypertension: Secondary | ICD-10-CM | POA: Diagnosis not present

## 2021-07-22 DIAGNOSIS — E538 Deficiency of other specified B group vitamins: Secondary | ICD-10-CM | POA: Diagnosis not present

## 2021-07-22 DIAGNOSIS — N39498 Other specified urinary incontinence: Secondary | ICD-10-CM | POA: Diagnosis not present

## 2021-07-22 NOTE — Telephone Encounter (Signed)
Melissa with [facility] called requesting Verbal orders to Recert OT.  Verbal orders given.

## 2021-07-23 ENCOUNTER — Telehealth: Payer: Self-pay | Admitting: *Deleted

## 2021-07-23 NOTE — Telephone Encounter (Signed)
Leonette Most PT with [facility] called and stated that he is Discontinuing patient from PT services due to NonCompliance. Stated that patient has refused serviced 4/5 times. Family is aware.   Nursing and OT are still working with patient.   FYI

## 2021-07-24 DIAGNOSIS — N39498 Other specified urinary incontinence: Secondary | ICD-10-CM | POA: Diagnosis not present

## 2021-07-24 DIAGNOSIS — I10 Essential (primary) hypertension: Secondary | ICD-10-CM | POA: Diagnosis not present

## 2021-07-24 DIAGNOSIS — K219 Gastro-esophageal reflux disease without esophagitis: Secondary | ICD-10-CM | POA: Diagnosis not present

## 2021-07-24 DIAGNOSIS — E538 Deficiency of other specified B group vitamins: Secondary | ICD-10-CM | POA: Diagnosis not present

## 2021-07-24 DIAGNOSIS — F02818 Dementia in other diseases classified elsewhere, unspecified severity, with other behavioral disturbance: Secondary | ICD-10-CM | POA: Diagnosis not present

## 2021-07-24 DIAGNOSIS — N401 Enlarged prostate with lower urinary tract symptoms: Secondary | ICD-10-CM | POA: Diagnosis not present

## 2021-07-25 ENCOUNTER — Telehealth: Payer: Self-pay | Admitting: *Deleted

## 2021-07-25 NOTE — Telephone Encounter (Signed)
Melissa with [facility] called requesting verbal orders for OT 1x6weeks.   Verbal orders given.

## 2021-07-26 DIAGNOSIS — E538 Deficiency of other specified B group vitamins: Secondary | ICD-10-CM | POA: Diagnosis not present

## 2021-07-26 DIAGNOSIS — N39498 Other specified urinary incontinence: Secondary | ICD-10-CM | POA: Diagnosis not present

## 2021-07-26 DIAGNOSIS — Z79899 Other long term (current) drug therapy: Secondary | ICD-10-CM | POA: Diagnosis not present

## 2021-07-26 DIAGNOSIS — F0390 Unspecified dementia without behavioral disturbance: Secondary | ICD-10-CM | POA: Diagnosis not present

## 2021-07-26 DIAGNOSIS — U071 COVID-19: Secondary | ICD-10-CM | POA: Diagnosis not present

## 2021-07-26 DIAGNOSIS — K219 Gastro-esophageal reflux disease without esophagitis: Secondary | ICD-10-CM | POA: Diagnosis not present

## 2021-07-26 DIAGNOSIS — I10 Essential (primary) hypertension: Secondary | ICD-10-CM | POA: Diagnosis not present

## 2021-07-26 DIAGNOSIS — N401 Enlarged prostate with lower urinary tract symptoms: Secondary | ICD-10-CM | POA: Diagnosis not present

## 2021-07-26 DIAGNOSIS — Z9181 History of falling: Secondary | ICD-10-CM | POA: Diagnosis not present

## 2021-08-08 DIAGNOSIS — Z20822 Contact with and (suspected) exposure to covid-19: Secondary | ICD-10-CM | POA: Diagnosis not present

## 2021-08-13 DIAGNOSIS — Z20822 Contact with and (suspected) exposure to covid-19: Secondary | ICD-10-CM | POA: Diagnosis not present

## 2021-08-13 DIAGNOSIS — H02102 Unspecified ectropion of right lower eyelid: Secondary | ICD-10-CM | POA: Diagnosis not present

## 2021-08-14 DIAGNOSIS — R3121 Asymptomatic microscopic hematuria: Secondary | ICD-10-CM | POA: Diagnosis not present

## 2021-08-14 DIAGNOSIS — N401 Enlarged prostate with lower urinary tract symptoms: Secondary | ICD-10-CM | POA: Diagnosis not present

## 2021-08-14 DIAGNOSIS — R350 Frequency of micturition: Secondary | ICD-10-CM | POA: Diagnosis not present

## 2021-08-15 ENCOUNTER — Encounter: Payer: Self-pay | Admitting: Nurse Practitioner

## 2021-08-15 DIAGNOSIS — Z20822 Contact with and (suspected) exposure to covid-19: Secondary | ICD-10-CM | POA: Diagnosis not present

## 2021-08-16 MED ORDER — DONEPEZIL HCL 5 MG PO TABS: 5.0000 mg | ORAL_TABLET | Freq: Every day | ORAL | 0 refills | Status: DC

## 2021-08-16 NOTE — Telephone Encounter (Signed)
Note and Rx faxed to facility [facility] Fax: [phone]

## 2021-08-20 DIAGNOSIS — Z20822 Contact with and (suspected) exposure to covid-19: Secondary | ICD-10-CM | POA: Diagnosis not present

## 2021-08-22 DIAGNOSIS — Z20822 Contact with and (suspected) exposure to covid-19: Secondary | ICD-10-CM | POA: Diagnosis not present

## 2021-08-25 DIAGNOSIS — Z9181 History of falling: Secondary | ICD-10-CM | POA: Diagnosis not present

## 2021-08-25 DIAGNOSIS — I10 Essential (primary) hypertension: Secondary | ICD-10-CM | POA: Diagnosis not present

## 2021-08-25 DIAGNOSIS — U071 COVID-19: Secondary | ICD-10-CM | POA: Diagnosis not present

## 2021-08-25 DIAGNOSIS — Z79899 Other long term (current) drug therapy: Secondary | ICD-10-CM | POA: Diagnosis not present

## 2021-08-25 DIAGNOSIS — K219 Gastro-esophageal reflux disease without esophagitis: Secondary | ICD-10-CM | POA: Diagnosis not present

## 2021-08-25 DIAGNOSIS — F0390 Unspecified dementia without behavioral disturbance: Secondary | ICD-10-CM | POA: Diagnosis not present

## 2021-08-25 DIAGNOSIS — N39498 Other specified urinary incontinence: Secondary | ICD-10-CM | POA: Diagnosis not present

## 2021-08-25 DIAGNOSIS — N401 Enlarged prostate with lower urinary tract symptoms: Secondary | ICD-10-CM | POA: Diagnosis not present

## 2021-08-25 DIAGNOSIS — E538 Deficiency of other specified B group vitamins: Secondary | ICD-10-CM | POA: Diagnosis not present

## 2021-08-27 DIAGNOSIS — Z20822 Contact with and (suspected) exposure to covid-19: Secondary | ICD-10-CM | POA: Diagnosis not present

## 2021-08-28 DIAGNOSIS — K219 Gastro-esophageal reflux disease without esophagitis: Secondary | ICD-10-CM | POA: Diagnosis not present

## 2021-08-28 DIAGNOSIS — N401 Enlarged prostate with lower urinary tract symptoms: Secondary | ICD-10-CM | POA: Diagnosis not present

## 2021-08-28 DIAGNOSIS — N39498 Other specified urinary incontinence: Secondary | ICD-10-CM | POA: Diagnosis not present

## 2021-08-28 DIAGNOSIS — E538 Deficiency of other specified B group vitamins: Secondary | ICD-10-CM | POA: Diagnosis not present

## 2021-08-28 DIAGNOSIS — U071 COVID-19: Secondary | ICD-10-CM | POA: Diagnosis not present

## 2021-08-28 DIAGNOSIS — I10 Essential (primary) hypertension: Secondary | ICD-10-CM | POA: Diagnosis not present

## 2021-08-29 DIAGNOSIS — Z20822 Contact with and (suspected) exposure to covid-19: Secondary | ICD-10-CM | POA: Diagnosis not present

## 2021-08-30 DIAGNOSIS — I10 Essential (primary) hypertension: Secondary | ICD-10-CM | POA: Diagnosis not present

## 2021-08-30 DIAGNOSIS — E538 Deficiency of other specified B group vitamins: Secondary | ICD-10-CM | POA: Diagnosis not present

## 2021-08-30 DIAGNOSIS — U071 COVID-19: Secondary | ICD-10-CM | POA: Diagnosis not present

## 2021-08-30 DIAGNOSIS — N401 Enlarged prostate with lower urinary tract symptoms: Secondary | ICD-10-CM | POA: Diagnosis not present

## 2021-08-30 DIAGNOSIS — N39498 Other specified urinary incontinence: Secondary | ICD-10-CM | POA: Diagnosis not present

## 2021-08-30 DIAGNOSIS — K219 Gastro-esophageal reflux disease without esophagitis: Secondary | ICD-10-CM | POA: Diagnosis not present

## 2021-09-03 DIAGNOSIS — Z20822 Contact with and (suspected) exposure to covid-19: Secondary | ICD-10-CM | POA: Diagnosis not present

## 2021-09-04 DIAGNOSIS — I10 Essential (primary) hypertension: Secondary | ICD-10-CM | POA: Diagnosis not present

## 2021-09-04 DIAGNOSIS — E538 Deficiency of other specified B group vitamins: Secondary | ICD-10-CM | POA: Diagnosis not present

## 2021-09-04 DIAGNOSIS — K219 Gastro-esophageal reflux disease without esophagitis: Secondary | ICD-10-CM | POA: Diagnosis not present

## 2021-09-04 DIAGNOSIS — U071 COVID-19: Secondary | ICD-10-CM | POA: Diagnosis not present

## 2021-09-04 DIAGNOSIS — N401 Enlarged prostate with lower urinary tract symptoms: Secondary | ICD-10-CM | POA: Diagnosis not present

## 2021-09-04 DIAGNOSIS — N39498 Other specified urinary incontinence: Secondary | ICD-10-CM | POA: Diagnosis not present

## 2021-09-11 DIAGNOSIS — N401 Enlarged prostate with lower urinary tract symptoms: Secondary | ICD-10-CM | POA: Diagnosis not present

## 2021-09-11 DIAGNOSIS — K219 Gastro-esophageal reflux disease without esophagitis: Secondary | ICD-10-CM | POA: Diagnosis not present

## 2021-09-11 DIAGNOSIS — I10 Essential (primary) hypertension: Secondary | ICD-10-CM | POA: Diagnosis not present

## 2021-09-11 DIAGNOSIS — E538 Deficiency of other specified B group vitamins: Secondary | ICD-10-CM | POA: Diagnosis not present

## 2021-09-11 DIAGNOSIS — N39498 Other specified urinary incontinence: Secondary | ICD-10-CM | POA: Diagnosis not present

## 2021-09-11 DIAGNOSIS — U071 COVID-19: Secondary | ICD-10-CM | POA: Diagnosis not present

## 2021-09-17 ENCOUNTER — Ambulatory Visit: Payer: Medicare Other | Admitting: Podiatry

## 2021-09-19 DIAGNOSIS — N39498 Other specified urinary incontinence: Secondary | ICD-10-CM | POA: Diagnosis not present

## 2021-09-19 DIAGNOSIS — E538 Deficiency of other specified B group vitamins: Secondary | ICD-10-CM | POA: Diagnosis not present

## 2021-09-19 DIAGNOSIS — I10 Essential (primary) hypertension: Secondary | ICD-10-CM | POA: Diagnosis not present

## 2021-09-19 DIAGNOSIS — K219 Gastro-esophageal reflux disease without esophagitis: Secondary | ICD-10-CM | POA: Diagnosis not present

## 2021-09-19 DIAGNOSIS — N401 Enlarged prostate with lower urinary tract symptoms: Secondary | ICD-10-CM | POA: Diagnosis not present

## 2021-09-19 DIAGNOSIS — U071 COVID-19: Secondary | ICD-10-CM | POA: Diagnosis not present

## 2021-09-24 DIAGNOSIS — U071 COVID-19: Secondary | ICD-10-CM | POA: Diagnosis not present

## 2021-09-24 DIAGNOSIS — Z79899 Other long term (current) drug therapy: Secondary | ICD-10-CM | POA: Diagnosis not present

## 2021-09-24 DIAGNOSIS — K219 Gastro-esophageal reflux disease without esophagitis: Secondary | ICD-10-CM | POA: Diagnosis not present

## 2021-09-24 DIAGNOSIS — F0390 Unspecified dementia without behavioral disturbance: Secondary | ICD-10-CM | POA: Diagnosis not present

## 2021-09-24 DIAGNOSIS — N39498 Other specified urinary incontinence: Secondary | ICD-10-CM | POA: Diagnosis not present

## 2021-09-24 DIAGNOSIS — I10 Essential (primary) hypertension: Secondary | ICD-10-CM | POA: Diagnosis not present

## 2021-09-24 DIAGNOSIS — Z9181 History of falling: Secondary | ICD-10-CM | POA: Diagnosis not present

## 2021-09-24 DIAGNOSIS — E538 Deficiency of other specified B group vitamins: Secondary | ICD-10-CM | POA: Diagnosis not present

## 2021-09-24 DIAGNOSIS — F02811 Dementia in other diseases classified elsewhere, unspecified severity, with agitation: Secondary | ICD-10-CM | POA: Diagnosis not present

## 2021-09-24 DIAGNOSIS — N401 Enlarged prostate with lower urinary tract symptoms: Secondary | ICD-10-CM | POA: Diagnosis not present

## 2021-09-25 DIAGNOSIS — U071 COVID-19: Secondary | ICD-10-CM | POA: Diagnosis not present

## 2021-09-25 DIAGNOSIS — I10 Essential (primary) hypertension: Secondary | ICD-10-CM | POA: Diagnosis not present

## 2021-09-25 DIAGNOSIS — N401 Enlarged prostate with lower urinary tract symptoms: Secondary | ICD-10-CM | POA: Diagnosis not present

## 2021-09-25 DIAGNOSIS — K219 Gastro-esophageal reflux disease without esophagitis: Secondary | ICD-10-CM | POA: Diagnosis not present

## 2021-09-25 DIAGNOSIS — N39498 Other specified urinary incontinence: Secondary | ICD-10-CM | POA: Diagnosis not present

## 2021-09-25 DIAGNOSIS — E538 Deficiency of other specified B group vitamins: Secondary | ICD-10-CM | POA: Diagnosis not present

## 2021-09-26 DIAGNOSIS — M79675 Pain in left toe(s): Secondary | ICD-10-CM | POA: Diagnosis not present

## 2021-09-26 DIAGNOSIS — M79674 Pain in right toe(s): Secondary | ICD-10-CM | POA: Diagnosis not present

## 2021-09-26 DIAGNOSIS — B351 Tinea unguium: Secondary | ICD-10-CM | POA: Diagnosis not present

## 2021-10-10 DIAGNOSIS — K219 Gastro-esophageal reflux disease without esophagitis: Secondary | ICD-10-CM | POA: Diagnosis not present

## 2021-10-10 DIAGNOSIS — N39498 Other specified urinary incontinence: Secondary | ICD-10-CM | POA: Diagnosis not present

## 2021-10-10 DIAGNOSIS — I10 Essential (primary) hypertension: Secondary | ICD-10-CM | POA: Diagnosis not present

## 2021-10-10 DIAGNOSIS — U071 COVID-19: Secondary | ICD-10-CM | POA: Diagnosis not present

## 2021-10-10 DIAGNOSIS — N401 Enlarged prostate with lower urinary tract symptoms: Secondary | ICD-10-CM | POA: Diagnosis not present

## 2021-10-10 DIAGNOSIS — E538 Deficiency of other specified B group vitamins: Secondary | ICD-10-CM | POA: Diagnosis not present

## 2021-10-22 ENCOUNTER — Other Ambulatory Visit: Payer: Self-pay

## 2021-10-22 ENCOUNTER — Ambulatory Visit (INDEPENDENT_AMBULATORY_CARE_PROVIDER_SITE_OTHER): Payer: Medicare Other | Admitting: Dermatology

## 2021-10-22 DIAGNOSIS — Z1283 Encounter for screening for malignant neoplasm of skin: Secondary | ICD-10-CM | POA: Diagnosis not present

## 2021-10-22 DIAGNOSIS — C44529 Squamous cell carcinoma of skin of other part of trunk: Secondary | ICD-10-CM

## 2021-10-22 DIAGNOSIS — D485 Neoplasm of uncertain behavior of skin: Secondary | ICD-10-CM

## 2021-10-22 DIAGNOSIS — Z85828 Personal history of other malignant neoplasm of skin: Secondary | ICD-10-CM | POA: Diagnosis not present

## 2021-10-22 DIAGNOSIS — C4492 Squamous cell carcinoma of skin, unspecified: Secondary | ICD-10-CM

## 2021-10-22 NOTE — Patient Instructions (Signed)

## 2021-10-23 DIAGNOSIS — N401 Enlarged prostate with lower urinary tract symptoms: Secondary | ICD-10-CM | POA: Diagnosis not present

## 2021-10-23 DIAGNOSIS — K219 Gastro-esophageal reflux disease without esophagitis: Secondary | ICD-10-CM | POA: Diagnosis not present

## 2021-10-23 DIAGNOSIS — U071 COVID-19: Secondary | ICD-10-CM | POA: Diagnosis not present

## 2021-10-23 DIAGNOSIS — I10 Essential (primary) hypertension: Secondary | ICD-10-CM | POA: Diagnosis not present

## 2021-10-23 DIAGNOSIS — N39498 Other specified urinary incontinence: Secondary | ICD-10-CM | POA: Diagnosis not present

## 2021-10-23 DIAGNOSIS — E538 Deficiency of other specified B group vitamins: Secondary | ICD-10-CM | POA: Diagnosis not present

## 2021-10-24 DIAGNOSIS — F0390 Unspecified dementia without behavioral disturbance: Secondary | ICD-10-CM | POA: Diagnosis not present

## 2021-10-24 DIAGNOSIS — U071 COVID-19: Secondary | ICD-10-CM | POA: Diagnosis not present

## 2021-10-24 DIAGNOSIS — K219 Gastro-esophageal reflux disease without esophagitis: Secondary | ICD-10-CM | POA: Diagnosis not present

## 2021-10-24 DIAGNOSIS — E538 Deficiency of other specified B group vitamins: Secondary | ICD-10-CM | POA: Diagnosis not present

## 2021-10-24 DIAGNOSIS — N39498 Other specified urinary incontinence: Secondary | ICD-10-CM | POA: Diagnosis not present

## 2021-10-24 DIAGNOSIS — N401 Enlarged prostate with lower urinary tract symptoms: Secondary | ICD-10-CM | POA: Diagnosis not present

## 2021-10-24 DIAGNOSIS — I10 Essential (primary) hypertension: Secondary | ICD-10-CM | POA: Diagnosis not present

## 2021-10-24 DIAGNOSIS — Z79899 Other long term (current) drug therapy: Secondary | ICD-10-CM | POA: Diagnosis not present

## 2021-10-24 DIAGNOSIS — F02811 Dementia in other diseases classified elsewhere, unspecified severity, with agitation: Secondary | ICD-10-CM | POA: Diagnosis not present

## 2021-10-24 DIAGNOSIS — Z9181 History of falling: Secondary | ICD-10-CM | POA: Diagnosis not present

## 2021-10-25 ENCOUNTER — Other Ambulatory Visit: Payer: Self-pay

## 2021-10-25 ENCOUNTER — Ambulatory Visit (INDEPENDENT_AMBULATORY_CARE_PROVIDER_SITE_OTHER): Payer: Medicare Other | Admitting: Nurse Practitioner

## 2021-10-25 ENCOUNTER — Encounter: Payer: Self-pay | Admitting: Nurse Practitioner

## 2021-10-25 VITALS — BP 136/80 | HR 89 | Temp 97.3°F | Ht 66.0 in | Wt 151.0 lb

## 2021-10-25 DIAGNOSIS — E538 Deficiency of other specified B group vitamins: Secondary | ICD-10-CM

## 2021-10-25 DIAGNOSIS — N401 Enlarged prostate with lower urinary tract symptoms: Secondary | ICD-10-CM | POA: Diagnosis not present

## 2021-10-25 DIAGNOSIS — R3916 Straining to void: Secondary | ICD-10-CM | POA: Diagnosis not present

## 2021-10-25 DIAGNOSIS — R7303 Prediabetes: Secondary | ICD-10-CM | POA: Diagnosis not present

## 2021-10-25 DIAGNOSIS — F0390 Unspecified dementia without behavioral disturbance: Secondary | ICD-10-CM | POA: Diagnosis not present

## 2021-10-25 DIAGNOSIS — D510 Vitamin B12 deficiency anemia due to intrinsic factor deficiency: Secondary | ICD-10-CM

## 2021-10-25 DIAGNOSIS — K2100 Gastro-esophageal reflux disease with esophagitis, without bleeding: Secondary | ICD-10-CM | POA: Diagnosis not present

## 2021-10-25 DIAGNOSIS — I10 Essential (primary) hypertension: Secondary | ICD-10-CM | POA: Diagnosis not present

## 2021-10-25 DIAGNOSIS — D692 Other nonthrombocytopenic purpura: Secondary | ICD-10-CM | POA: Diagnosis not present

## 2021-10-25 DIAGNOSIS — E785 Hyperlipidemia, unspecified: Secondary | ICD-10-CM | POA: Diagnosis not present
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
## 2021-10-26 LAB — VITAMIN B12: Vitamin B-12: 900 pg/mL (ref 200–1100)

## 2021-10-26 LAB — CBC WITH DIFFERENTIAL/PLATELET
Absolute Monocytes: 493 cells/uL (ref 200–950)
Basophils Absolute: 38 cells/uL (ref 0–200)
Basophils Relative: 0.6 %
Eosinophils Absolute: 0 cells/uL — ABNORMAL LOW (ref 15–500)
Eosinophils Relative: 0 %
HCT: 44.8 % (ref 38.5–50.0)
Hemoglobin: 15.1 g/dL (ref 13.2–17.1)
Lymphs Abs: 1197 cells/uL (ref 850–3900)
MCH: 33.1 pg — ABNORMAL HIGH (ref 27.0–33.0)
MCHC: 33.7 g/dL (ref 32.0–36.0)
MCV: 98.2 fL (ref 80.0–100.0)
MPV: 11.1 fL (ref 7.5–12.5)
Monocytes Relative: 7.7 %
Neutro Abs: 4672 cells/uL (ref 1500–7800)
Neutrophils Relative %: 73 %
Platelets: 169 10*3/uL (ref 140–400)
RBC: 4.56 10*6/uL (ref 4.20–5.80)
RDW: 12.8 % (ref 11.0–15.0)
Total Lymphocyte: 18.7 %
WBC: 6.4 10*3/uL (ref 3.8–10.8)

## 2021-10-26 LAB — COMPLETE METABOLIC PANEL WITH GFR
AG Ratio: 1.4 (calc) (ref 1.0–2.5)
ALT: 20 U/L (ref 9–46)
AST: 20 U/L (ref 10–35)
Albumin: 4 g/dL (ref 3.6–5.1)
Alkaline phosphatase (APISO): 71 U/L (ref 35–144)
BUN/Creatinine Ratio: 18 (calc) (ref 6–22)
BUN: 26 mg/dL — ABNORMAL HIGH (ref 7–25)
CO2: 28 mmol/L (ref 20–32)
Calcium: 9 mg/dL (ref 8.6–10.3)
Chloride: 104 mmol/L (ref 98–110)
Creat: 1.45 mg/dL — ABNORMAL HIGH (ref 0.70–1.22)
Globulin: 2.8 g/dL (calc) (ref 1.9–3.7)
Glucose, Bld: 143 mg/dL — ABNORMAL HIGH (ref 65–139)
Potassium: 4.4 mmol/L (ref 3.5–5.3)
Sodium: 140 mmol/L (ref 135–146)
Total Bilirubin: 1.3 mg/dL — ABNORMAL HIGH (ref 0.2–1.2)
Total Protein: 6.8 g/dL (ref 6.1–8.1)
eGFR: 48 mL/min/{1.73_m2} — ABNORMAL LOW (ref >=60)

## 2021-10-26 LAB — HEMOGLOBIN A1C
Hgb A1c MFr Bld: 5.9 % of total Hgb — ABNORMAL HIGH (ref <5.7)
Mean Plasma Glucose: 123 mg/dL
eAG (mmol/L): 6.8 mmol/L

## 2021-10-31 ENCOUNTER — Telehealth: Payer: Self-pay

## 2021-10-31 DIAGNOSIS — H6123 Impacted cerumen, bilateral: Secondary | ICD-10-CM

## 2021-10-31 MED ORDER — UNABLE TO FIND: 0 refills | Status: DC

## 2021-10-31 NOTE — Telephone Encounter (Signed)
Order electronically signed  ?

## 2021-10-31 NOTE — Telephone Encounter (Signed)
Patients daughter called stating [facility] has not received the order for [facility].  ? ?[name] states they have received the cover page x 3 however no order. I informed [name] that the cover page and order was faxed together. ? ?[name] is asking that we re-submit. ? ?Order is in route to scanning and we will not be able to access it in the system for several days.  ? ?I am pending another order via the RX platform for Lauree Chandler, NP to review, make additions if necessary and to print. ? ?RX/order to be stamped as Lauree Chandler, NP is offsite and faxed to [facility] at [phone] ?

## 2021-10-31 NOTE — Addendum Note (Signed)
Addended by: Lauree Chandler on: 10/31/2021 02:42 PM ? ? Modules accepted: Orders ? ?

## 2021-10-31 NOTE — Telephone Encounter (Signed)
Order faxed.

## 2021-10-31 NOTE — Addendum Note (Signed)
Addended by: Logan Bores on: 10/31/2021 02:18 PM ? ? Modules accepted: Orders ? ?

## 2021-10-31 NOTE — Telephone Encounter (Signed)
Finis Bud with [facility] called stating they need the order resubmitted with specifics ? ?The exact number of days and how many drops per day.  ? ?Order is pending to de edited  ?

## 2021-11-01 ENCOUNTER — Encounter: Payer: Self-pay | Admitting: Nurse Practitioner

## 2021-11-01 NOTE — Telephone Encounter (Signed)
Order was faxed again yesterday with the specifics  ?

## 2021-11-02 ENCOUNTER — Encounter: Payer: Self-pay | Admitting: Dermatology

## 2021-11-02 NOTE — Progress Notes (Signed)
? ?  Follow-Up Visit ?  ?Subjective  ?Andrew Osborne is a 81 y.o. male who presents for the following: Annual Exam (Pt here with son in law, Nicki Reaper, is here ofr annual exam. Pt needs a skin tag  on the L neck removed. Plays with it a lot as per son in law. Pt has hx of bcc). ? ?Skin check, new growth on left, collarbone ?Location:  ?Duration:  ?Quality:  ?Associated Signs/Symptoms: ?Modifying Factors:  ?Severity:  ?Timing: ?Context:  ? ?Objective  ?Well appearing patient in no apparent distress; mood and affect are within normal limits. ?Full Body ?No atypical pigmented lesions.  No recurrent nonmelanoma skin cancer.  1 probable new nonmelanoma skin cancer below left anterior neck which will be biopsied and treated. ? ?Left Supraclavicular Area ?Remarkable 9 mm hornlike projection 0.2 cm pink waxy base ? ? ? ? ? ? ? ? ?All skin waist up examined. ? ? ?Assessment & Plan  ? ? ?Screening exam for skin cancer ?Full Body ? ?Annual skin examination ? ?Squamous cell carcinoma of skin ?Left Supraclavicular Area ? ?Skin / nail biopsy ?Type of biopsy: tangential   ?Informed consent: discussed and consent obtained   ?Timeout: patient name, date of birth, surgical site, and procedure verified   ?Anesthesia: the lesion was anesthetized in a standard fashion   ?Anesthetic:  1% lidocaine w/ epinephrine 1-100,000 local infiltration ?Instrument used: flexible razor blade   ?Hemostasis achieved with: ferric subsulfate and electrodesiccation   ?Outcome: patient tolerated procedure well   ?Post-procedure details: wound care instructions given   ? ?Destruction of lesion ?Complexity: simple   ?Destruction method: electrodesiccation and curettage   ?Informed consent: discussed and consent obtained   ?Timeout:  patient name, date of birth, surgical site, and procedure verified ?Anesthesia: the lesion was anesthetized in a standard fashion   ?Anesthetic:  1% lidocaine w/ epinephrine 1-100,000 local infiltration ?Curettage performed in three  different directions: Yes   ?Electrodesiccation performed over the curetted area: Yes   ?Curettage cycles:  3 ?Margin per side (cm):  0.1 ?Final wound size (cm):  0.4 ?Hemostasis achieved with:  ferric subsulfate ?Outcome: patient tolerated procedure well with no complications   ?Post-procedure details: wound care instructions given   ? ?Specimen 1 - Surgical pathology ?Differential Diagnosis: R/O BCC VS SCC - TXPBX ? ?Check Margins: No ? ?Shave biopsy showed some deeper keratin which was curetted and cauterized. ? ? ? ? ? ?I, Lavonna Monarch, MD, have reviewed all documentation for this visit.  The documentation on 11/02/21 for the exam, diagnosis, procedures, and orders are all accurate and complete. ?

## 2021-11-06 DIAGNOSIS — K219 Gastro-esophageal reflux disease without esophagitis: Secondary | ICD-10-CM | POA: Diagnosis not present

## 2021-11-06 DIAGNOSIS — N401 Enlarged prostate with lower urinary tract symptoms: Secondary | ICD-10-CM | POA: Diagnosis not present

## 2021-11-06 DIAGNOSIS — U071 COVID-19: Secondary | ICD-10-CM | POA: Diagnosis not present

## 2021-11-06 DIAGNOSIS — N39498 Other specified urinary incontinence: Secondary | ICD-10-CM | POA: Diagnosis not present

## 2021-11-06 DIAGNOSIS — I10 Essential (primary) hypertension: Secondary | ICD-10-CM | POA: Diagnosis not present

## 2021-11-06 DIAGNOSIS — E538 Deficiency of other specified B group vitamins: Secondary | ICD-10-CM | POA: Diagnosis not present

## 2021-11-19 DIAGNOSIS — I10 Essential (primary) hypertension: Secondary | ICD-10-CM | POA: Diagnosis not present

## 2021-11-19 DIAGNOSIS — N39498 Other specified urinary incontinence: Secondary | ICD-10-CM | POA: Diagnosis not present

## 2021-11-19 DIAGNOSIS — E538 Deficiency of other specified B group vitamins: Secondary | ICD-10-CM | POA: Diagnosis not present

## 2021-11-19 DIAGNOSIS — K219 Gastro-esophageal reflux disease without esophagitis: Secondary | ICD-10-CM | POA: Diagnosis not present

## 2021-11-19 DIAGNOSIS — U071 COVID-19: Secondary | ICD-10-CM | POA: Diagnosis not present

## 2021-11-19 DIAGNOSIS — N401 Enlarged prostate with lower urinary tract symptoms: Secondary | ICD-10-CM | POA: Diagnosis not present

## 2021-11-23 DIAGNOSIS — Z9181 History of falling: Secondary | ICD-10-CM | POA: Diagnosis not present

## 2021-11-23 DIAGNOSIS — N39498 Other specified urinary incontinence: Secondary | ICD-10-CM | POA: Diagnosis not present

## 2021-11-23 DIAGNOSIS — K219 Gastro-esophageal reflux disease without esophagitis: Secondary | ICD-10-CM | POA: Diagnosis not present

## 2021-11-23 DIAGNOSIS — F02811 Dementia in other diseases classified elsewhere, unspecified severity, with agitation: Secondary | ICD-10-CM | POA: Diagnosis not present

## 2021-11-23 DIAGNOSIS — Z79899 Other long term (current) drug therapy: Secondary | ICD-10-CM | POA: Diagnosis not present

## 2021-11-23 DIAGNOSIS — N401 Enlarged prostate with lower urinary tract symptoms: Secondary | ICD-10-CM | POA: Diagnosis not present

## 2021-11-23 DIAGNOSIS — F0390 Unspecified dementia without behavioral disturbance: Secondary | ICD-10-CM | POA: Diagnosis not present

## 2021-11-23 DIAGNOSIS — E538 Deficiency of other specified B group vitamins: Secondary | ICD-10-CM | POA: Diagnosis not present

## 2021-11-23 DIAGNOSIS — U071 COVID-19: Secondary | ICD-10-CM | POA: Diagnosis not present

## 2021-11-23 DIAGNOSIS — I10 Essential (primary) hypertension: Secondary | ICD-10-CM | POA: Diagnosis not present

## 2021-11-25 DIAGNOSIS — Z20822 Contact with and (suspected) exposure to covid-19: Secondary | ICD-10-CM | POA: Diagnosis not present

## 2021-11-27 DIAGNOSIS — R350 Frequency of micturition: Secondary | ICD-10-CM | POA: Diagnosis not present

## 2021-11-27 DIAGNOSIS — M79674 Pain in right toe(s): Secondary | ICD-10-CM | POA: Diagnosis not present

## 2021-11-27 DIAGNOSIS — N323 Diverticulum of bladder: Secondary | ICD-10-CM | POA: Diagnosis not present

## 2021-11-27 DIAGNOSIS — N401 Enlarged prostate with lower urinary tract symptoms: Secondary | ICD-10-CM | POA: Diagnosis not present

## 2021-11-27 DIAGNOSIS — M79675 Pain in left toe(s): Secondary | ICD-10-CM | POA: Diagnosis not present

## 2021-11-27 DIAGNOSIS — B351 Tinea unguium: Secondary | ICD-10-CM | POA: Diagnosis not present

## 2021-11-27 DIAGNOSIS — R3121 Asymptomatic microscopic hematuria: Secondary | ICD-10-CM | POA: Diagnosis not present

## 2021-12-05 DIAGNOSIS — I10 Essential (primary) hypertension: Secondary | ICD-10-CM | POA: Diagnosis not present

## 2021-12-05 DIAGNOSIS — K219 Gastro-esophageal reflux disease without esophagitis: Secondary | ICD-10-CM | POA: Diagnosis not present

## 2021-12-05 DIAGNOSIS — N401 Enlarged prostate with lower urinary tract symptoms: Secondary | ICD-10-CM | POA: Diagnosis not present

## 2021-12-05 DIAGNOSIS — U071 COVID-19: Secondary | ICD-10-CM | POA: Diagnosis not present

## 2021-12-05 DIAGNOSIS — E538 Deficiency of other specified B group vitamins: Secondary | ICD-10-CM | POA: Diagnosis not present

## 2021-12-05 DIAGNOSIS — N39498 Other specified urinary incontinence: Secondary | ICD-10-CM | POA: Diagnosis not present

## 2021-12-10 ENCOUNTER — Encounter: Payer: Self-pay | Admitting: Nurse Practitioner

## 2021-12-10 NOTE — Telephone Encounter (Signed)
Message printed and faxed to facility.  ?

## 2021-12-12 ENCOUNTER — Other Ambulatory Visit: Payer: Self-pay | Admitting: *Deleted

## 2021-12-12 MED ORDER — MECLIZINE HCL 12.5 MG PO TABS: 12.5000 mg | ORAL_TABLET | Freq: Three times a day (TID) | ORAL | 1 refills | Status: DC | PRN

## 2021-12-12 NOTE — Telephone Encounter (Signed)
Selma with [facility] called and stated that they need a signed written Rx for order. Printed and faxed to facility Fax: [phone] ? ? ?Per Jessica--Yes he can use meclizine 12.5 mg by mouth every 8 hours as needed for dizziness, this is over the counter.  ?

## 2021-12-16 DIAGNOSIS — I10 Essential (primary) hypertension: Secondary | ICD-10-CM | POA: Diagnosis not present

## 2021-12-16 DIAGNOSIS — E538 Deficiency of other specified B group vitamins: Secondary | ICD-10-CM | POA: Diagnosis not present

## 2021-12-16 DIAGNOSIS — N39498 Other specified urinary incontinence: Secondary | ICD-10-CM | POA: Diagnosis not present

## 2021-12-16 DIAGNOSIS — N401 Enlarged prostate with lower urinary tract symptoms: Secondary | ICD-10-CM | POA: Diagnosis not present

## 2021-12-16 DIAGNOSIS — K219 Gastro-esophageal reflux disease without esophagitis: Secondary | ICD-10-CM | POA: Diagnosis not present

## 2021-12-16 DIAGNOSIS — U071 COVID-19: Secondary | ICD-10-CM | POA: Diagnosis not present

## 2021-12-20 ENCOUNTER — Encounter: Payer: Self-pay | Admitting: Dermatology

## 2021-12-23 DIAGNOSIS — F0390 Unspecified dementia without behavioral disturbance: Secondary | ICD-10-CM | POA: Diagnosis not present

## 2021-12-23 DIAGNOSIS — Z79899 Other long term (current) drug therapy: Secondary | ICD-10-CM | POA: Diagnosis not present

## 2021-12-23 DIAGNOSIS — K219 Gastro-esophageal reflux disease without esophagitis: Secondary | ICD-10-CM | POA: Diagnosis not present

## 2021-12-23 DIAGNOSIS — F02811 Dementia in other diseases classified elsewhere, unspecified severity, with agitation: Secondary | ICD-10-CM | POA: Diagnosis not present

## 2021-12-23 DIAGNOSIS — U071 COVID-19: Secondary | ICD-10-CM | POA: Diagnosis not present

## 2021-12-23 DIAGNOSIS — N39498 Other specified urinary incontinence: Secondary | ICD-10-CM | POA: Diagnosis not present

## 2021-12-23 DIAGNOSIS — N401 Enlarged prostate with lower urinary tract symptoms: Secondary | ICD-10-CM | POA: Diagnosis not present

## 2021-12-23 DIAGNOSIS — E538 Deficiency of other specified B group vitamins: Secondary | ICD-10-CM | POA: Diagnosis not present

## 2021-12-23 DIAGNOSIS — Z9181 History of falling: Secondary | ICD-10-CM | POA: Diagnosis not present

## 2021-12-23 DIAGNOSIS — I10 Essential (primary) hypertension: Secondary | ICD-10-CM | POA: Diagnosis not present

## 2022-01-01 DIAGNOSIS — K219 Gastro-esophageal reflux disease without esophagitis: Secondary | ICD-10-CM | POA: Diagnosis not present

## 2022-01-01 DIAGNOSIS — U071 COVID-19: Secondary | ICD-10-CM | POA: Diagnosis not present

## 2022-01-01 DIAGNOSIS — I10 Essential (primary) hypertension: Secondary | ICD-10-CM | POA: Diagnosis not present

## 2022-01-01 DIAGNOSIS — N401 Enlarged prostate with lower urinary tract symptoms: Secondary | ICD-10-CM | POA: Diagnosis not present

## 2022-01-01 DIAGNOSIS — N39498 Other specified urinary incontinence: Secondary | ICD-10-CM | POA: Diagnosis not present

## 2022-01-01 DIAGNOSIS — E538 Deficiency of other specified B group vitamins: Secondary | ICD-10-CM | POA: Diagnosis not present

## 2022-01-14 DIAGNOSIS — E538 Deficiency of other specified B group vitamins: Secondary | ICD-10-CM | POA: Diagnosis not present

## 2022-01-14 DIAGNOSIS — N401 Enlarged prostate with lower urinary tract symptoms: Secondary | ICD-10-CM | POA: Diagnosis not present

## 2022-01-14 DIAGNOSIS — N39498 Other specified urinary incontinence: Secondary | ICD-10-CM | POA: Diagnosis not present

## 2022-01-14 DIAGNOSIS — K219 Gastro-esophageal reflux disease without esophagitis: Secondary | ICD-10-CM | POA: Diagnosis not present

## 2022-01-14 DIAGNOSIS — U071 COVID-19: Secondary | ICD-10-CM | POA: Diagnosis not present

## 2022-01-14 DIAGNOSIS — I10 Essential (primary) hypertension: Secondary | ICD-10-CM | POA: Diagnosis not present

## 2022-01-18 DIAGNOSIS — R1319 Other dysphagia: Secondary | ICD-10-CM | POA: Diagnosis not present

## 2022-01-18 DIAGNOSIS — K2100 Gastro-esophageal reflux disease with esophagitis, without bleeding: Secondary | ICD-10-CM | POA: Diagnosis present

## 2022-01-18 DIAGNOSIS — R1314 Dysphagia, pharyngoesophageal phase: Secondary | ICD-10-CM | POA: Diagnosis present

## 2022-01-18 DIAGNOSIS — Z978 Presence of other specified devices: Secondary | ICD-10-CM | POA: Diagnosis not present

## 2022-01-18 DIAGNOSIS — T4275XA Adverse effect of unspecified antiepileptic and sedative-hypnotic drugs, initial encounter: Secondary | ICD-10-CM | POA: Diagnosis present

## 2022-01-18 DIAGNOSIS — L03012 Cellulitis of left finger: Secondary | ICD-10-CM | POA: Diagnosis present

## 2022-01-18 DIAGNOSIS — K2270 Barrett's esophagus without dysplasia: Secondary | ICD-10-CM | POA: Diagnosis present

## 2022-01-18 DIAGNOSIS — R911 Solitary pulmonary nodule: Secondary | ICD-10-CM | POA: Diagnosis present

## 2022-01-18 DIAGNOSIS — R578 Other shock: Secondary | ICD-10-CM | POA: Diagnosis present

## 2022-01-18 DIAGNOSIS — T18128A Food in esophagus causing other injury, initial encounter: Secondary | ICD-10-CM | POA: Diagnosis not present

## 2022-01-18 DIAGNOSIS — T17920A Food in respiratory tract, part unspecified causing asphyxiation, initial encounter: Secondary | ICD-10-CM | POA: Diagnosis present

## 2022-01-18 DIAGNOSIS — K2289 Other specified disease of esophagus: Secondary | ICD-10-CM | POA: Diagnosis not present

## 2022-01-18 DIAGNOSIS — R918 Other nonspecific abnormal finding of lung field: Secondary | ICD-10-CM | POA: Diagnosis not present

## 2022-01-18 DIAGNOSIS — K449 Diaphragmatic hernia without obstruction or gangrene: Secondary | ICD-10-CM | POA: Diagnosis present

## 2022-01-18 DIAGNOSIS — J811 Chronic pulmonary edema: Secondary | ICD-10-CM | POA: Diagnosis not present

## 2022-01-18 DIAGNOSIS — T18108A Unspecified foreign body in esophagus causing other injury, initial encounter: Secondary | ICD-10-CM | POA: Diagnosis not present

## 2022-01-18 DIAGNOSIS — J9811 Atelectasis: Secondary | ICD-10-CM | POA: Diagnosis not present

## 2022-01-18 DIAGNOSIS — Z79899 Other long term (current) drug therapy: Secondary | ICD-10-CM | POA: Diagnosis not present

## 2022-01-18 DIAGNOSIS — K56699 Other intestinal obstruction unspecified as to partial versus complete obstruction: Secondary | ICD-10-CM | POA: Diagnosis not present

## 2022-01-18 DIAGNOSIS — Z9911 Dependence on respirator [ventilator] status: Secondary | ICD-10-CM | POA: Diagnosis not present

## 2022-01-18 DIAGNOSIS — Z820 Family history of epilepsy and other diseases of the nervous system: Secondary | ICD-10-CM | POA: Diagnosis not present

## 2022-01-18 DIAGNOSIS — Z8719 Personal history of other diseases of the digestive system: Secondary | ICD-10-CM | POA: Diagnosis not present

## 2022-01-18 DIAGNOSIS — K222 Esophageal obstruction: Secondary | ICD-10-CM | POA: Diagnosis present

## 2022-01-18 DIAGNOSIS — J9601 Acute respiratory failure with hypoxia: Secondary | ICD-10-CM | POA: Diagnosis present

## 2022-01-18 DIAGNOSIS — Z4682 Encounter for fitting and adjustment of non-vascular catheter: Secondary | ICD-10-CM | POA: Diagnosis not present

## 2022-01-18 DIAGNOSIS — B3781 Candidal esophagitis: Secondary | ICD-10-CM | POA: Diagnosis present

## 2022-01-18 DIAGNOSIS — Z66 Do not resuscitate: Secondary | ICD-10-CM | POA: Diagnosis not present

## 2022-01-18 DIAGNOSIS — K209 Esophagitis, unspecified without bleeding: Secondary | ICD-10-CM | POA: Diagnosis not present

## 2022-01-18 DIAGNOSIS — R579 Shock, unspecified: Secondary | ICD-10-CM | POA: Diagnosis not present

## 2022-01-18 DIAGNOSIS — K229 Disease of esophagus, unspecified: Secondary | ICD-10-CM | POA: Diagnosis not present

## 2022-01-18 DIAGNOSIS — N400 Enlarged prostate without lower urinary tract symptoms: Secondary | ICD-10-CM | POA: Diagnosis present

## 2022-01-18 DIAGNOSIS — J690 Pneumonitis due to inhalation of food and vomit: Secondary | ICD-10-CM | POA: Diagnosis present

## 2022-01-18 DIAGNOSIS — F0150 Vascular dementia without behavioral disturbance: Secondary | ICD-10-CM | POA: Diagnosis not present

## 2022-01-18 DIAGNOSIS — E538 Deficiency of other specified B group vitamins: Secondary | ICD-10-CM | POA: Diagnosis present

## 2022-01-18 DIAGNOSIS — R051 Acute cough: Secondary | ICD-10-CM | POA: Diagnosis not present

## 2022-01-18 DIAGNOSIS — Z800 Family history of malignant neoplasm of digestive organs: Secondary | ICD-10-CM | POA: Diagnosis not present

## 2022-01-18 DIAGNOSIS — J939 Pneumothorax, unspecified: Secondary | ICD-10-CM | POA: Diagnosis present

## 2022-01-18 DIAGNOSIS — K219 Gastro-esophageal reflux disease without esophagitis: Secondary | ICD-10-CM | POA: Diagnosis not present

## 2022-01-18 DIAGNOSIS — D801 Nonfamilial hypogammaglobulinemia: Secondary | ICD-10-CM | POA: Diagnosis present

## 2022-01-19 DIAGNOSIS — Z978 Presence of other specified devices: Secondary | ICD-10-CM | POA: Diagnosis not present

## 2022-01-19 DIAGNOSIS — T18128A Food in esophagus causing other injury, initial encounter: Secondary | ICD-10-CM | POA: Diagnosis not present

## 2022-01-19 DIAGNOSIS — R1314 Dysphagia, pharyngoesophageal phase: Secondary | ICD-10-CM | POA: Diagnosis present

## 2022-01-19 DIAGNOSIS — Z8719 Personal history of other diseases of the digestive system: Secondary | ICD-10-CM | POA: Diagnosis not present

## 2022-01-19 DIAGNOSIS — N400 Enlarged prostate without lower urinary tract symptoms: Secondary | ICD-10-CM | POA: Diagnosis present

## 2022-01-19 DIAGNOSIS — K449 Diaphragmatic hernia without obstruction or gangrene: Secondary | ICD-10-CM | POA: Diagnosis present

## 2022-01-19 DIAGNOSIS — K56699 Other intestinal obstruction unspecified as to partial versus complete obstruction: Secondary | ICD-10-CM | POA: Diagnosis not present

## 2022-01-19 DIAGNOSIS — J690 Pneumonitis due to inhalation of food and vomit: Secondary | ICD-10-CM | POA: Diagnosis present

## 2022-01-19 DIAGNOSIS — K219 Gastro-esophageal reflux disease without esophagitis: Secondary | ICD-10-CM | POA: Diagnosis present

## 2022-01-19 DIAGNOSIS — T17920A Food in respiratory tract, part unspecified causing asphyxiation, initial encounter: Secondary | ICD-10-CM | POA: Diagnosis present

## 2022-01-19 DIAGNOSIS — L03012 Cellulitis of left finger: Secondary | ICD-10-CM | POA: Diagnosis present

## 2022-01-19 DIAGNOSIS — R578 Other shock: Secondary | ICD-10-CM | POA: Diagnosis present

## 2022-01-19 DIAGNOSIS — J939 Pneumothorax, unspecified: Secondary | ICD-10-CM | POA: Diagnosis present

## 2022-01-19 DIAGNOSIS — Z66 Do not resuscitate: Secondary | ICD-10-CM | POA: Diagnosis not present

## 2022-01-19 DIAGNOSIS — B3781 Candidal esophagitis: Secondary | ICD-10-CM | POA: Diagnosis present

## 2022-01-19 DIAGNOSIS — T4275XA Adverse effect of unspecified antiepileptic and sedative-hypnotic drugs, initial encounter: Secondary | ICD-10-CM | POA: Diagnosis present

## 2022-01-19 DIAGNOSIS — J811 Chronic pulmonary edema: Secondary | ICD-10-CM | POA: Diagnosis not present

## 2022-01-19 DIAGNOSIS — K209 Esophagitis, unspecified without bleeding: Secondary | ICD-10-CM | POA: Diagnosis not present

## 2022-01-19 DIAGNOSIS — D801 Nonfamilial hypogammaglobulinemia: Secondary | ICD-10-CM | POA: Diagnosis present

## 2022-01-19 DIAGNOSIS — K2289 Other specified disease of esophagus: Secondary | ICD-10-CM | POA: Diagnosis not present

## 2022-01-19 DIAGNOSIS — Z800 Family history of malignant neoplasm of digestive organs: Secondary | ICD-10-CM | POA: Diagnosis not present

## 2022-01-19 DIAGNOSIS — Z4682 Encounter for fitting and adjustment of non-vascular catheter: Secondary | ICD-10-CM | POA: Diagnosis not present

## 2022-01-19 DIAGNOSIS — K229 Disease of esophagus, unspecified: Secondary | ICD-10-CM | POA: Diagnosis not present

## 2022-01-19 DIAGNOSIS — R911 Solitary pulmonary nodule: Secondary | ICD-10-CM | POA: Diagnosis present

## 2022-01-19 DIAGNOSIS — E538 Deficiency of other specified B group vitamins: Secondary | ICD-10-CM | POA: Diagnosis present

## 2022-01-19 DIAGNOSIS — R579 Shock, unspecified: Secondary | ICD-10-CM | POA: Diagnosis not present

## 2022-01-19 DIAGNOSIS — K2270 Barrett's esophagus without dysplasia: Secondary | ICD-10-CM | POA: Diagnosis present

## 2022-01-19 DIAGNOSIS — J9811 Atelectasis: Secondary | ICD-10-CM | POA: Diagnosis not present

## 2022-01-19 DIAGNOSIS — Z820 Family history of epilepsy and other diseases of the nervous system: Secondary | ICD-10-CM | POA: Diagnosis not present

## 2022-01-19 DIAGNOSIS — F0150 Vascular dementia without behavioral disturbance: Secondary | ICD-10-CM | POA: Diagnosis present

## 2022-01-19 DIAGNOSIS — R918 Other nonspecific abnormal finding of lung field: Secondary | ICD-10-CM | POA: Diagnosis not present

## 2022-01-19 DIAGNOSIS — T18108A Unspecified foreign body in esophagus causing other injury, initial encounter: Secondary | ICD-10-CM | POA: Diagnosis not present

## 2022-01-19 DIAGNOSIS — J9601 Acute respiratory failure with hypoxia: Secondary | ICD-10-CM | POA: Diagnosis present

## 2022-01-19 DIAGNOSIS — R051 Acute cough: Secondary | ICD-10-CM | POA: Diagnosis not present

## 2022-01-19 DIAGNOSIS — Z79899 Other long term (current) drug therapy: Secondary | ICD-10-CM | POA: Diagnosis not present

## 2022-01-19 DIAGNOSIS — K2100 Gastro-esophageal reflux disease with esophagitis, without bleeding: Secondary | ICD-10-CM | POA: Diagnosis present

## 2022-01-19 DIAGNOSIS — K222 Esophageal obstruction: Secondary | ICD-10-CM | POA: Diagnosis present

## 2022-01-19 DIAGNOSIS — R1319 Other dysphagia: Secondary | ICD-10-CM | POA: Diagnosis not present

## 2022-01-19 DIAGNOSIS — Z9911 Dependence on respirator [ventilator] status: Secondary | ICD-10-CM | POA: Diagnosis not present

## 2022-01-24 DIAGNOSIS — T18128D Food in esophagus causing other injury, subsequent encounter: Secondary | ICD-10-CM | POA: Diagnosis not present

## 2022-01-24 DIAGNOSIS — J690 Pneumonitis due to inhalation of food and vomit: Secondary | ICD-10-CM | POA: Diagnosis not present

## 2022-01-24 DIAGNOSIS — J9601 Acute respiratory failure with hypoxia: Secondary | ICD-10-CM | POA: Diagnosis not present

## 2022-01-24 DIAGNOSIS — I10 Essential (primary) hypertension: Secondary | ICD-10-CM | POA: Diagnosis not present

## 2022-01-24 DIAGNOSIS — K449 Diaphragmatic hernia without obstruction or gangrene: Secondary | ICD-10-CM | POA: Diagnosis not present

## 2022-01-24 DIAGNOSIS — K222 Esophageal obstruction: Secondary | ICD-10-CM | POA: Diagnosis not present

## 2022-01-24 DIAGNOSIS — B9689 Other specified bacterial agents as the cause of diseases classified elsewhere: Secondary | ICD-10-CM | POA: Diagnosis not present

## 2022-01-24 DIAGNOSIS — R7881 Bacteremia: Secondary | ICD-10-CM | POA: Diagnosis not present

## 2022-01-24 DIAGNOSIS — B3781 Candidal esophagitis: Secondary | ICD-10-CM | POA: Diagnosis not present

## 2022-01-24 DIAGNOSIS — F0150 Vascular dementia without behavioral disturbance: Secondary | ICD-10-CM | POA: Diagnosis not present

## 2022-01-24 DIAGNOSIS — R911 Solitary pulmonary nodule: Secondary | ICD-10-CM | POA: Diagnosis not present

## 2022-01-24 DIAGNOSIS — D519 Vitamin B12 deficiency anemia, unspecified: Secondary | ICD-10-CM | POA: Diagnosis not present

## 2022-01-24 DIAGNOSIS — N400 Enlarged prostate without lower urinary tract symptoms: Secondary | ICD-10-CM | POA: Diagnosis not present

## 2022-01-28 DIAGNOSIS — I10 Essential (primary) hypertension: Secondary | ICD-10-CM | POA: Diagnosis not present

## 2022-01-28 DIAGNOSIS — D519 Vitamin B12 deficiency anemia, unspecified: Secondary | ICD-10-CM | POA: Diagnosis not present

## 2022-01-28 DIAGNOSIS — K222 Esophageal obstruction: Secondary | ICD-10-CM | POA: Diagnosis not present

## 2022-01-28 DIAGNOSIS — J690 Pneumonitis due to inhalation of food and vomit: Secondary | ICD-10-CM | POA: Diagnosis not present

## 2022-01-28 DIAGNOSIS — N400 Enlarged prostate without lower urinary tract symptoms: Secondary | ICD-10-CM | POA: Diagnosis not present

## 2022-01-28 DIAGNOSIS — F0150 Vascular dementia without behavioral disturbance: Secondary | ICD-10-CM | POA: Diagnosis not present

## 2022-01-30 ENCOUNTER — Telehealth: Payer: Self-pay

## 2022-01-30 NOTE — Telephone Encounter (Signed)
Okay to give verbal order.

## 2022-01-30 NOTE — Telephone Encounter (Signed)
Verbal order given to [facility] with [place]

## 2022-01-30 NOTE — Telephone Encounter (Signed)
Jessica,NP w/ [facility] called stated she need a verbal order to continue B12 injections every other week for patient.

## 2022-02-04 DIAGNOSIS — K222 Esophageal obstruction: Secondary | ICD-10-CM | POA: Diagnosis not present

## 2022-02-04 DIAGNOSIS — F0150 Vascular dementia without behavioral disturbance: Secondary | ICD-10-CM | POA: Diagnosis not present

## 2022-02-04 DIAGNOSIS — N400 Enlarged prostate without lower urinary tract symptoms: Secondary | ICD-10-CM | POA: Diagnosis not present

## 2022-02-04 DIAGNOSIS — D519 Vitamin B12 deficiency anemia, unspecified: Secondary | ICD-10-CM | POA: Diagnosis not present

## 2022-02-04 DIAGNOSIS — I10 Essential (primary) hypertension: Secondary | ICD-10-CM | POA: Diagnosis not present

## 2022-02-04 DIAGNOSIS — J690 Pneumonitis due to inhalation of food and vomit: Secondary | ICD-10-CM | POA: Diagnosis not present

## 2022-02-06 DIAGNOSIS — M79674 Pain in right toe(s): Secondary | ICD-10-CM | POA: Diagnosis not present

## 2022-02-06 DIAGNOSIS — M79675 Pain in left toe(s): Secondary | ICD-10-CM | POA: Diagnosis not present

## 2022-02-06 DIAGNOSIS — B351 Tinea unguium: Secondary | ICD-10-CM | POA: Diagnosis not present

## 2022-02-07 DIAGNOSIS — K222 Esophageal obstruction: Secondary | ICD-10-CM | POA: Diagnosis not present

## 2022-02-07 DIAGNOSIS — N400 Enlarged prostate without lower urinary tract symptoms: Secondary | ICD-10-CM | POA: Diagnosis not present

## 2022-02-07 DIAGNOSIS — D519 Vitamin B12 deficiency anemia, unspecified: Secondary | ICD-10-CM | POA: Diagnosis not present

## 2022-02-07 DIAGNOSIS — I10 Essential (primary) hypertension: Secondary | ICD-10-CM | POA: Diagnosis not present

## 2022-02-07 DIAGNOSIS — F0150 Vascular dementia without behavioral disturbance: Secondary | ICD-10-CM | POA: Diagnosis not present

## 2022-02-07 DIAGNOSIS — J690 Pneumonitis due to inhalation of food and vomit: Secondary | ICD-10-CM | POA: Diagnosis not present

## 2022-02-11 DIAGNOSIS — M6281 Muscle weakness (generalized): Secondary | ICD-10-CM | POA: Diagnosis not present

## 2022-02-11 DIAGNOSIS — N401 Enlarged prostate with lower urinary tract symptoms: Secondary | ICD-10-CM | POA: Diagnosis not present

## 2022-02-11 DIAGNOSIS — D519 Vitamin B12 deficiency anemia, unspecified: Secondary | ICD-10-CM | POA: Diagnosis not present

## 2022-02-11 DIAGNOSIS — R634 Abnormal weight loss: Secondary | ICD-10-CM | POA: Diagnosis not present

## 2022-02-11 DIAGNOSIS — K219 Gastro-esophageal reflux disease without esophagitis: Secondary | ICD-10-CM | POA: Diagnosis not present

## 2022-02-11 DIAGNOSIS — F0390 Unspecified dementia without behavioral disturbance: Secondary | ICD-10-CM | POA: Diagnosis not present

## 2022-02-12 DIAGNOSIS — J690 Pneumonitis due to inhalation of food and vomit: Secondary | ICD-10-CM | POA: Diagnosis not present

## 2022-02-12 DIAGNOSIS — D519 Vitamin B12 deficiency anemia, unspecified: Secondary | ICD-10-CM | POA: Diagnosis not present

## 2022-02-12 DIAGNOSIS — N400 Enlarged prostate without lower urinary tract symptoms: Secondary | ICD-10-CM | POA: Diagnosis not present

## 2022-02-12 DIAGNOSIS — F0150 Vascular dementia without behavioral disturbance: Secondary | ICD-10-CM | POA: Diagnosis not present

## 2022-02-12 DIAGNOSIS — I10 Essential (primary) hypertension: Secondary | ICD-10-CM | POA: Diagnosis not present

## 2022-02-12 DIAGNOSIS — K222 Esophageal obstruction: Secondary | ICD-10-CM | POA: Diagnosis not present

## 2022-02-14 DIAGNOSIS — I10 Essential (primary) hypertension: Secondary | ICD-10-CM | POA: Diagnosis not present

## 2022-02-14 DIAGNOSIS — J690 Pneumonitis due to inhalation of food and vomit: Secondary | ICD-10-CM | POA: Diagnosis not present

## 2022-02-14 DIAGNOSIS — K222 Esophageal obstruction: Secondary | ICD-10-CM | POA: Diagnosis not present

## 2022-02-14 DIAGNOSIS — F0150 Vascular dementia without behavioral disturbance: Secondary | ICD-10-CM | POA: Diagnosis not present

## 2022-02-14 DIAGNOSIS — D519 Vitamin B12 deficiency anemia, unspecified: Secondary | ICD-10-CM | POA: Diagnosis not present

## 2022-02-14 DIAGNOSIS — N400 Enlarged prostate without lower urinary tract symptoms: Secondary | ICD-10-CM | POA: Diagnosis not present

## 2022-02-18 DIAGNOSIS — K222 Esophageal obstruction: Secondary | ICD-10-CM | POA: Diagnosis not present

## 2022-02-18 DIAGNOSIS — I10 Essential (primary) hypertension: Secondary | ICD-10-CM | POA: Diagnosis not present

## 2022-02-18 DIAGNOSIS — J690 Pneumonitis due to inhalation of food and vomit: Secondary | ICD-10-CM | POA: Diagnosis not present

## 2022-02-18 DIAGNOSIS — N400 Enlarged prostate without lower urinary tract symptoms: Secondary | ICD-10-CM | POA: Diagnosis not present

## 2022-02-18 DIAGNOSIS — D519 Vitamin B12 deficiency anemia, unspecified: Secondary | ICD-10-CM | POA: Diagnosis not present

## 2022-02-18 DIAGNOSIS — F0150 Vascular dementia without behavioral disturbance: Secondary | ICD-10-CM | POA: Diagnosis not present

## 2022-02-19 DIAGNOSIS — I10 Essential (primary) hypertension: Secondary | ICD-10-CM | POA: Diagnosis not present

## 2022-02-19 DIAGNOSIS — F0150 Vascular dementia without behavioral disturbance: Secondary | ICD-10-CM | POA: Diagnosis not present

## 2022-02-19 DIAGNOSIS — D519 Vitamin B12 deficiency anemia, unspecified: Secondary | ICD-10-CM | POA: Diagnosis not present

## 2022-02-19 DIAGNOSIS — N400 Enlarged prostate without lower urinary tract symptoms: Secondary | ICD-10-CM | POA: Diagnosis not present

## 2022-02-19 DIAGNOSIS — K222 Esophageal obstruction: Secondary | ICD-10-CM | POA: Diagnosis not present

## 2022-02-19 DIAGNOSIS — J690 Pneumonitis due to inhalation of food and vomit: Secondary | ICD-10-CM | POA: Diagnosis not present

## 2022-02-21 DIAGNOSIS — D519 Vitamin B12 deficiency anemia, unspecified: Secondary | ICD-10-CM | POA: Diagnosis not present

## 2022-02-21 DIAGNOSIS — K222 Esophageal obstruction: Secondary | ICD-10-CM | POA: Diagnosis not present

## 2022-02-21 DIAGNOSIS — J690 Pneumonitis due to inhalation of food and vomit: Secondary | ICD-10-CM | POA: Diagnosis not present

## 2022-02-21 DIAGNOSIS — N400 Enlarged prostate without lower urinary tract symptoms: Secondary | ICD-10-CM | POA: Diagnosis not present

## 2022-02-21 DIAGNOSIS — I10 Essential (primary) hypertension: Secondary | ICD-10-CM | POA: Diagnosis not present

## 2022-02-21 DIAGNOSIS — F0150 Vascular dementia without behavioral disturbance: Secondary | ICD-10-CM | POA: Diagnosis not present

## 2022-02-23 DIAGNOSIS — K449 Diaphragmatic hernia without obstruction or gangrene: Secondary | ICD-10-CM | POA: Diagnosis not present

## 2022-02-23 DIAGNOSIS — N400 Enlarged prostate without lower urinary tract symptoms: Secondary | ICD-10-CM | POA: Diagnosis not present

## 2022-02-23 DIAGNOSIS — R7881 Bacteremia: Secondary | ICD-10-CM | POA: Diagnosis not present

## 2022-02-23 DIAGNOSIS — J9601 Acute respiratory failure with hypoxia: Secondary | ICD-10-CM | POA: Diagnosis not present

## 2022-02-23 DIAGNOSIS — B3781 Candidal esophagitis: Secondary | ICD-10-CM | POA: Diagnosis not present

## 2022-02-23 DIAGNOSIS — B9689 Other specified bacterial agents as the cause of diseases classified elsewhere: Secondary | ICD-10-CM | POA: Diagnosis not present

## 2022-02-23 DIAGNOSIS — D519 Vitamin B12 deficiency anemia, unspecified: Secondary | ICD-10-CM | POA: Diagnosis not present

## 2022-02-23 DIAGNOSIS — K222 Esophageal obstruction: Secondary | ICD-10-CM | POA: Diagnosis not present

## 2022-02-23 DIAGNOSIS — R911 Solitary pulmonary nodule: Secondary | ICD-10-CM | POA: Diagnosis not present

## 2022-02-23 DIAGNOSIS — I10 Essential (primary) hypertension: Secondary | ICD-10-CM | POA: Diagnosis not present

## 2022-02-23 DIAGNOSIS — F0150 Vascular dementia without behavioral disturbance: Secondary | ICD-10-CM | POA: Diagnosis not present

## 2022-02-23 DIAGNOSIS — T18128D Food in esophagus causing other injury, subsequent encounter: Secondary | ICD-10-CM | POA: Diagnosis not present

## 2022-02-23 DIAGNOSIS — J690 Pneumonitis due to inhalation of food and vomit: Secondary | ICD-10-CM | POA: Diagnosis not present

## 2022-02-24 DIAGNOSIS — D519 Vitamin B12 deficiency anemia, unspecified: Secondary | ICD-10-CM | POA: Diagnosis not present

## 2022-02-24 DIAGNOSIS — F0150 Vascular dementia without behavioral disturbance: Secondary | ICD-10-CM | POA: Diagnosis not present

## 2022-02-24 DIAGNOSIS — N400 Enlarged prostate without lower urinary tract symptoms: Secondary | ICD-10-CM | POA: Diagnosis not present

## 2022-02-24 DIAGNOSIS — K222 Esophageal obstruction: Secondary | ICD-10-CM | POA: Diagnosis not present

## 2022-02-24 DIAGNOSIS — J690 Pneumonitis due to inhalation of food and vomit: Secondary | ICD-10-CM | POA: Diagnosis not present

## 2022-02-24 DIAGNOSIS — I10 Essential (primary) hypertension: Secondary | ICD-10-CM | POA: Diagnosis not present

## 2022-02-25 DIAGNOSIS — I10 Essential (primary) hypertension: Secondary | ICD-10-CM | POA: Diagnosis not present

## 2022-02-25 DIAGNOSIS — D519 Vitamin B12 deficiency anemia, unspecified: Secondary | ICD-10-CM | POA: Diagnosis not present

## 2022-02-25 DIAGNOSIS — J690 Pneumonitis due to inhalation of food and vomit: Secondary | ICD-10-CM | POA: Diagnosis not present

## 2022-02-25 DIAGNOSIS — N400 Enlarged prostate without lower urinary tract symptoms: Secondary | ICD-10-CM | POA: Diagnosis not present

## 2022-02-25 DIAGNOSIS — K222 Esophageal obstruction: Secondary | ICD-10-CM | POA: Diagnosis not present

## 2022-02-25 DIAGNOSIS — F0150 Vascular dementia without behavioral disturbance: Secondary | ICD-10-CM | POA: Diagnosis not present

## 2022-02-26 DIAGNOSIS — J690 Pneumonitis due to inhalation of food and vomit: Secondary | ICD-10-CM | POA: Diagnosis not present

## 2022-02-26 DIAGNOSIS — N400 Enlarged prostate without lower urinary tract symptoms: Secondary | ICD-10-CM | POA: Diagnosis not present

## 2022-02-26 DIAGNOSIS — K222 Esophageal obstruction: Secondary | ICD-10-CM | POA: Diagnosis not present

## 2022-02-26 DIAGNOSIS — I10 Essential (primary) hypertension: Secondary | ICD-10-CM | POA: Diagnosis not present

## 2022-02-26 DIAGNOSIS — D519 Vitamin B12 deficiency anemia, unspecified: Secondary | ICD-10-CM | POA: Diagnosis not present

## 2022-02-26 DIAGNOSIS — F0150 Vascular dementia without behavioral disturbance: Secondary | ICD-10-CM | POA: Diagnosis not present

## 2022-02-27 DIAGNOSIS — N400 Enlarged prostate without lower urinary tract symptoms: Secondary | ICD-10-CM | POA: Diagnosis not present

## 2022-02-27 DIAGNOSIS — F0150 Vascular dementia without behavioral disturbance: Secondary | ICD-10-CM | POA: Diagnosis not present

## 2022-02-27 DIAGNOSIS — D519 Vitamin B12 deficiency anemia, unspecified: Secondary | ICD-10-CM | POA: Diagnosis not present

## 2022-02-27 DIAGNOSIS — K222 Esophageal obstruction: Secondary | ICD-10-CM | POA: Diagnosis not present

## 2022-02-27 DIAGNOSIS — I10 Essential (primary) hypertension: Secondary | ICD-10-CM | POA: Diagnosis not present

## 2022-02-27 DIAGNOSIS — J690 Pneumonitis due to inhalation of food and vomit: Secondary | ICD-10-CM | POA: Diagnosis not present

## 2022-02-28 DIAGNOSIS — N400 Enlarged prostate without lower urinary tract symptoms: Secondary | ICD-10-CM | POA: Diagnosis not present

## 2022-02-28 DIAGNOSIS — I10 Essential (primary) hypertension: Secondary | ICD-10-CM | POA: Diagnosis not present

## 2022-02-28 DIAGNOSIS — F0150 Vascular dementia without behavioral disturbance: Secondary | ICD-10-CM | POA: Diagnosis not present

## 2022-02-28 DIAGNOSIS — D519 Vitamin B12 deficiency anemia, unspecified: Secondary | ICD-10-CM | POA: Diagnosis not present

## 2022-02-28 DIAGNOSIS — K222 Esophageal obstruction: Secondary | ICD-10-CM | POA: Diagnosis not present

## 2022-02-28 DIAGNOSIS — J690 Pneumonitis due to inhalation of food and vomit: Secondary | ICD-10-CM | POA: Diagnosis not present

## 2022-03-03 DIAGNOSIS — I10 Essential (primary) hypertension: Secondary | ICD-10-CM | POA: Diagnosis not present

## 2022-03-03 DIAGNOSIS — F0150 Vascular dementia without behavioral disturbance: Secondary | ICD-10-CM | POA: Diagnosis not present

## 2022-03-03 DIAGNOSIS — N400 Enlarged prostate without lower urinary tract symptoms: Secondary | ICD-10-CM | POA: Diagnosis not present

## 2022-03-03 DIAGNOSIS — J690 Pneumonitis due to inhalation of food and vomit: Secondary | ICD-10-CM | POA: Diagnosis not present

## 2022-03-03 DIAGNOSIS — D519 Vitamin B12 deficiency anemia, unspecified: Secondary | ICD-10-CM | POA: Diagnosis not present

## 2022-03-03 DIAGNOSIS — K222 Esophageal obstruction: Secondary | ICD-10-CM | POA: Diagnosis not present

## 2022-03-06 DIAGNOSIS — D519 Vitamin B12 deficiency anemia, unspecified: Secondary | ICD-10-CM | POA: Diagnosis not present

## 2022-03-06 DIAGNOSIS — N400 Enlarged prostate without lower urinary tract symptoms: Secondary | ICD-10-CM | POA: Diagnosis not present

## 2022-03-06 DIAGNOSIS — F0150 Vascular dementia without behavioral disturbance: Secondary | ICD-10-CM | POA: Diagnosis not present

## 2022-03-06 DIAGNOSIS — J690 Pneumonitis due to inhalation of food and vomit: Secondary | ICD-10-CM | POA: Diagnosis not present

## 2022-03-06 DIAGNOSIS — K222 Esophageal obstruction: Secondary | ICD-10-CM | POA: Diagnosis not present

## 2022-03-06 DIAGNOSIS — I10 Essential (primary) hypertension: Secondary | ICD-10-CM | POA: Diagnosis not present

## 2022-03-07 DIAGNOSIS — D519 Vitamin B12 deficiency anemia, unspecified: Secondary | ICD-10-CM | POA: Diagnosis not present

## 2022-03-07 DIAGNOSIS — J690 Pneumonitis due to inhalation of food and vomit: Secondary | ICD-10-CM | POA: Diagnosis not present

## 2022-03-07 DIAGNOSIS — N400 Enlarged prostate without lower urinary tract symptoms: Secondary | ICD-10-CM | POA: Diagnosis not present

## 2022-03-07 DIAGNOSIS — F0150 Vascular dementia without behavioral disturbance: Secondary | ICD-10-CM | POA: Diagnosis not present

## 2022-03-07 DIAGNOSIS — K222 Esophageal obstruction: Secondary | ICD-10-CM | POA: Diagnosis not present

## 2022-03-07 DIAGNOSIS — I10 Essential (primary) hypertension: Secondary | ICD-10-CM | POA: Diagnosis not present

## 2022-03-10 DIAGNOSIS — B372 Candidiasis of skin and nail: Secondary | ICD-10-CM | POA: Diagnosis not present

## 2022-03-10 DIAGNOSIS — F0150 Vascular dementia without behavioral disturbance: Secondary | ICD-10-CM | POA: Diagnosis not present

## 2022-03-11 DIAGNOSIS — N400 Enlarged prostate without lower urinary tract symptoms: Secondary | ICD-10-CM | POA: Diagnosis not present

## 2022-03-11 DIAGNOSIS — J690 Pneumonitis due to inhalation of food and vomit: Secondary | ICD-10-CM | POA: Diagnosis not present

## 2022-03-11 DIAGNOSIS — D519 Vitamin B12 deficiency anemia, unspecified: Secondary | ICD-10-CM | POA: Diagnosis not present

## 2022-03-11 DIAGNOSIS — I10 Essential (primary) hypertension: Secondary | ICD-10-CM | POA: Diagnosis not present

## 2022-03-11 DIAGNOSIS — F0150 Vascular dementia without behavioral disturbance: Secondary | ICD-10-CM | POA: Diagnosis not present

## 2022-03-11 DIAGNOSIS — K222 Esophageal obstruction: Secondary | ICD-10-CM | POA: Diagnosis not present

## 2022-03-13 DIAGNOSIS — K222 Esophageal obstruction: Secondary | ICD-10-CM | POA: Diagnosis not present

## 2022-03-13 DIAGNOSIS — D519 Vitamin B12 deficiency anemia, unspecified: Secondary | ICD-10-CM | POA: Diagnosis not present

## 2022-03-13 DIAGNOSIS — I10 Essential (primary) hypertension: Secondary | ICD-10-CM | POA: Diagnosis not present

## 2022-03-13 DIAGNOSIS — N401 Enlarged prostate with lower urinary tract symptoms: Secondary | ICD-10-CM | POA: Diagnosis not present

## 2022-03-13 DIAGNOSIS — F5101 Primary insomnia: Secondary | ICD-10-CM | POA: Diagnosis not present

## 2022-03-13 DIAGNOSIS — J690 Pneumonitis due to inhalation of food and vomit: Secondary | ICD-10-CM | POA: Diagnosis not present

## 2022-03-13 DIAGNOSIS — K219 Gastro-esophageal reflux disease without esophagitis: Secondary | ICD-10-CM | POA: Diagnosis not present

## 2022-03-13 DIAGNOSIS — N400 Enlarged prostate without lower urinary tract symptoms: Secondary | ICD-10-CM | POA: Diagnosis not present

## 2022-03-13 DIAGNOSIS — F0150 Vascular dementia without behavioral disturbance: Secondary | ICD-10-CM | POA: Diagnosis not present

## 2022-03-14 DIAGNOSIS — K222 Esophageal obstruction: Secondary | ICD-10-CM | POA: Diagnosis not present

## 2022-03-14 DIAGNOSIS — J690 Pneumonitis due to inhalation of food and vomit: Secondary | ICD-10-CM | POA: Diagnosis not present

## 2022-03-14 DIAGNOSIS — I10 Essential (primary) hypertension: Secondary | ICD-10-CM | POA: Diagnosis not present

## 2022-03-14 DIAGNOSIS — D519 Vitamin B12 deficiency anemia, unspecified: Secondary | ICD-10-CM | POA: Diagnosis not present

## 2022-03-14 DIAGNOSIS — F0150 Vascular dementia without behavioral disturbance: Secondary | ICD-10-CM | POA: Diagnosis not present

## 2022-03-14 DIAGNOSIS — N400 Enlarged prostate without lower urinary tract symptoms: Secondary | ICD-10-CM | POA: Diagnosis not present

## 2022-03-18 DIAGNOSIS — N400 Enlarged prostate without lower urinary tract symptoms: Secondary | ICD-10-CM | POA: Diagnosis not present

## 2022-03-18 DIAGNOSIS — J690 Pneumonitis due to inhalation of food and vomit: Secondary | ICD-10-CM | POA: Diagnosis not present

## 2022-03-18 DIAGNOSIS — F0150 Vascular dementia without behavioral disturbance: Secondary | ICD-10-CM | POA: Diagnosis not present

## 2022-03-18 DIAGNOSIS — D519 Vitamin B12 deficiency anemia, unspecified: Secondary | ICD-10-CM | POA: Diagnosis not present

## 2022-03-18 DIAGNOSIS — K222 Esophageal obstruction: Secondary | ICD-10-CM | POA: Diagnosis not present

## 2022-03-18 DIAGNOSIS — I10 Essential (primary) hypertension: Secondary | ICD-10-CM | POA: Diagnosis not present

## 2022-03-19 DIAGNOSIS — J690 Pneumonitis due to inhalation of food and vomit: Secondary | ICD-10-CM | POA: Diagnosis not present

## 2022-03-19 DIAGNOSIS — D519 Vitamin B12 deficiency anemia, unspecified: Secondary | ICD-10-CM | POA: Diagnosis not present

## 2022-03-19 DIAGNOSIS — K222 Esophageal obstruction: Secondary | ICD-10-CM | POA: Diagnosis not present

## 2022-03-19 DIAGNOSIS — N400 Enlarged prostate without lower urinary tract symptoms: Secondary | ICD-10-CM | POA: Diagnosis not present

## 2022-03-19 DIAGNOSIS — I10 Essential (primary) hypertension: Secondary | ICD-10-CM | POA: Diagnosis not present

## 2022-03-19 DIAGNOSIS — F0150 Vascular dementia without behavioral disturbance: Secondary | ICD-10-CM | POA: Diagnosis not present

## 2022-03-21 DIAGNOSIS — D519 Vitamin B12 deficiency anemia, unspecified: Secondary | ICD-10-CM | POA: Diagnosis not present

## 2022-03-21 DIAGNOSIS — N400 Enlarged prostate without lower urinary tract symptoms: Secondary | ICD-10-CM | POA: Diagnosis not present

## 2022-03-21 DIAGNOSIS — I10 Essential (primary) hypertension: Secondary | ICD-10-CM | POA: Diagnosis not present

## 2022-03-21 DIAGNOSIS — J690 Pneumonitis due to inhalation of food and vomit: Secondary | ICD-10-CM | POA: Diagnosis not present

## 2022-03-21 DIAGNOSIS — F0150 Vascular dementia without behavioral disturbance: Secondary | ICD-10-CM | POA: Diagnosis not present

## 2022-03-21 DIAGNOSIS — K222 Esophageal obstruction: Secondary | ICD-10-CM | POA: Diagnosis not present

## 2022-03-24 DIAGNOSIS — I10 Essential (primary) hypertension: Secondary | ICD-10-CM | POA: Diagnosis not present

## 2022-03-24 DIAGNOSIS — F0150 Vascular dementia without behavioral disturbance: Secondary | ICD-10-CM | POA: Diagnosis not present

## 2022-03-24 DIAGNOSIS — K222 Esophageal obstruction: Secondary | ICD-10-CM | POA: Diagnosis not present

## 2022-03-24 DIAGNOSIS — N400 Enlarged prostate without lower urinary tract symptoms: Secondary | ICD-10-CM | POA: Diagnosis not present

## 2022-03-24 DIAGNOSIS — D519 Vitamin B12 deficiency anemia, unspecified: Secondary | ICD-10-CM | POA: Diagnosis not present

## 2022-03-24 DIAGNOSIS — J690 Pneumonitis due to inhalation of food and vomit: Secondary | ICD-10-CM | POA: Diagnosis not present

## 2022-03-25 DIAGNOSIS — R7303 Prediabetes: Secondary | ICD-10-CM | POA: Diagnosis not present

## 2022-03-25 DIAGNOSIS — D849 Immunodeficiency, unspecified: Secondary | ICD-10-CM | POA: Diagnosis not present

## 2022-03-25 DIAGNOSIS — I10 Essential (primary) hypertension: Secondary | ICD-10-CM | POA: Diagnosis not present

## 2022-03-25 DIAGNOSIS — K5900 Constipation, unspecified: Secondary | ICD-10-CM | POA: Diagnosis not present

## 2022-03-25 DIAGNOSIS — Z8701 Personal history of pneumonia (recurrent): Secondary | ICD-10-CM | POA: Diagnosis not present

## 2022-03-25 DIAGNOSIS — F0154 Vascular dementia, unspecified severity, with anxiety: Secondary | ICD-10-CM | POA: Diagnosis not present

## 2022-03-25 DIAGNOSIS — G4700 Insomnia, unspecified: Secondary | ICD-10-CM | POA: Diagnosis not present

## 2022-03-25 DIAGNOSIS — R634 Abnormal weight loss: Secondary | ICD-10-CM | POA: Diagnosis not present

## 2022-03-25 DIAGNOSIS — N401 Enlarged prostate with lower urinary tract symptoms: Secondary | ICD-10-CM | POA: Diagnosis not present

## 2022-03-25 DIAGNOSIS — K219 Gastro-esophageal reflux disease without esophagitis: Secondary | ICD-10-CM | POA: Diagnosis not present

## 2022-03-25 DIAGNOSIS — R911 Solitary pulmonary nodule: Secondary | ICD-10-CM | POA: Diagnosis not present

## 2022-03-25 DIAGNOSIS — K222 Esophageal obstruction: Secondary | ICD-10-CM | POA: Diagnosis not present

## 2022-03-25 DIAGNOSIS — K449 Diaphragmatic hernia without obstruction or gangrene: Secondary | ICD-10-CM | POA: Diagnosis not present

## 2022-03-25 DIAGNOSIS — F01511 Vascular dementia, unspecified severity, with agitation: Secondary | ICD-10-CM | POA: Diagnosis not present

## 2022-03-25 DIAGNOSIS — D510 Vitamin B12 deficiency anemia due to intrinsic factor deficiency: Secondary | ICD-10-CM | POA: Diagnosis not present

## 2022-03-25 DIAGNOSIS — N39498 Other specified urinary incontinence: Secondary | ICD-10-CM | POA: Diagnosis not present

## 2022-03-27 DIAGNOSIS — K5900 Constipation, unspecified: Secondary | ICD-10-CM | POA: Diagnosis not present

## 2022-03-27 DIAGNOSIS — G4700 Insomnia, unspecified: Secondary | ICD-10-CM | POA: Diagnosis not present

## 2022-03-27 DIAGNOSIS — R634 Abnormal weight loss: Secondary | ICD-10-CM | POA: Diagnosis not present

## 2022-03-27 DIAGNOSIS — K219 Gastro-esophageal reflux disease without esophagitis: Secondary | ICD-10-CM | POA: Diagnosis not present

## 2022-03-27 DIAGNOSIS — D519 Vitamin B12 deficiency anemia, unspecified: Secondary | ICD-10-CM | POA: Diagnosis not present

## 2022-03-27 DIAGNOSIS — D510 Vitamin B12 deficiency anemia due to intrinsic factor deficiency: Secondary | ICD-10-CM | POA: Diagnosis not present

## 2022-03-27 DIAGNOSIS — N401 Enlarged prostate with lower urinary tract symptoms: Secondary | ICD-10-CM | POA: Diagnosis not present

## 2022-03-27 DIAGNOSIS — R7303 Prediabetes: Secondary | ICD-10-CM | POA: Diagnosis not present

## 2022-03-27 DIAGNOSIS — M6281 Muscle weakness (generalized): Secondary | ICD-10-CM | POA: Diagnosis not present

## 2022-03-28 DIAGNOSIS — K219 Gastro-esophageal reflux disease without esophagitis: Secondary | ICD-10-CM | POA: Diagnosis not present

## 2022-03-28 DIAGNOSIS — D510 Vitamin B12 deficiency anemia due to intrinsic factor deficiency: Secondary | ICD-10-CM | POA: Diagnosis not present

## 2022-03-28 DIAGNOSIS — K5900 Constipation, unspecified: Secondary | ICD-10-CM | POA: Diagnosis not present

## 2022-03-28 DIAGNOSIS — R634 Abnormal weight loss: Secondary | ICD-10-CM | POA: Diagnosis not present

## 2022-03-28 DIAGNOSIS — R7303 Prediabetes: Secondary | ICD-10-CM | POA: Diagnosis not present

## 2022-03-28 DIAGNOSIS — G4700 Insomnia, unspecified: Secondary | ICD-10-CM | POA: Diagnosis not present

## 2022-03-31 DIAGNOSIS — R634 Abnormal weight loss: Secondary | ICD-10-CM | POA: Diagnosis not present

## 2022-03-31 DIAGNOSIS — D510 Vitamin B12 deficiency anemia due to intrinsic factor deficiency: Secondary | ICD-10-CM | POA: Diagnosis not present

## 2022-03-31 DIAGNOSIS — R7303 Prediabetes: Secondary | ICD-10-CM | POA: Diagnosis not present

## 2022-03-31 DIAGNOSIS — G4700 Insomnia, unspecified: Secondary | ICD-10-CM | POA: Diagnosis not present

## 2022-03-31 DIAGNOSIS — K219 Gastro-esophageal reflux disease without esophagitis: Secondary | ICD-10-CM | POA: Diagnosis not present

## 2022-03-31 DIAGNOSIS — K5900 Constipation, unspecified: Secondary | ICD-10-CM | POA: Diagnosis not present

## 2022-04-01 DIAGNOSIS — D510 Vitamin B12 deficiency anemia due to intrinsic factor deficiency: Secondary | ICD-10-CM | POA: Diagnosis not present

## 2022-04-01 DIAGNOSIS — R7303 Prediabetes: Secondary | ICD-10-CM | POA: Diagnosis not present

## 2022-04-01 DIAGNOSIS — K219 Gastro-esophageal reflux disease without esophagitis: Secondary | ICD-10-CM | POA: Diagnosis not present

## 2022-04-03 DIAGNOSIS — R634 Abnormal weight loss: Secondary | ICD-10-CM | POA: Diagnosis not present

## 2022-04-03 DIAGNOSIS — K219 Gastro-esophageal reflux disease without esophagitis: Secondary | ICD-10-CM | POA: Diagnosis not present

## 2022-04-03 DIAGNOSIS — D510 Vitamin B12 deficiency anemia due to intrinsic factor deficiency: Secondary | ICD-10-CM | POA: Diagnosis not present

## 2022-04-03 DIAGNOSIS — G4700 Insomnia, unspecified: Secondary | ICD-10-CM | POA: Diagnosis not present

## 2022-04-03 DIAGNOSIS — R7303 Prediabetes: Secondary | ICD-10-CM | POA: Diagnosis not present

## 2022-04-03 DIAGNOSIS — K5900 Constipation, unspecified: Secondary | ICD-10-CM | POA: Diagnosis not present

## 2022-04-07 DIAGNOSIS — N401 Enlarged prostate with lower urinary tract symptoms: Secondary | ICD-10-CM | POA: Diagnosis not present

## 2022-04-07 DIAGNOSIS — F0150 Vascular dementia without behavioral disturbance: Secondary | ICD-10-CM | POA: Diagnosis not present

## 2022-04-07 DIAGNOSIS — R7303 Prediabetes: Secondary | ICD-10-CM | POA: Diagnosis not present

## 2022-04-07 DIAGNOSIS — K219 Gastro-esophageal reflux disease without esophagitis: Secondary | ICD-10-CM | POA: Diagnosis not present

## 2022-04-08 DIAGNOSIS — D510 Vitamin B12 deficiency anemia due to intrinsic factor deficiency: Secondary | ICD-10-CM | POA: Diagnosis not present

## 2022-04-08 DIAGNOSIS — K219 Gastro-esophageal reflux disease without esophagitis: Secondary | ICD-10-CM | POA: Diagnosis not present

## 2022-04-08 DIAGNOSIS — R634 Abnormal weight loss: Secondary | ICD-10-CM | POA: Diagnosis not present

## 2022-04-08 DIAGNOSIS — R7303 Prediabetes: Secondary | ICD-10-CM | POA: Diagnosis not present

## 2022-04-08 DIAGNOSIS — K5900 Constipation, unspecified: Secondary | ICD-10-CM | POA: Diagnosis not present

## 2022-04-08 DIAGNOSIS — G4700 Insomnia, unspecified: Secondary | ICD-10-CM | POA: Diagnosis not present

## 2022-04-09 DIAGNOSIS — K5900 Constipation, unspecified: Secondary | ICD-10-CM | POA: Diagnosis not present

## 2022-04-09 DIAGNOSIS — G4700 Insomnia, unspecified: Secondary | ICD-10-CM | POA: Diagnosis not present

## 2022-04-09 DIAGNOSIS — R634 Abnormal weight loss: Secondary | ICD-10-CM | POA: Diagnosis not present

## 2022-04-09 DIAGNOSIS — D510 Vitamin B12 deficiency anemia due to intrinsic factor deficiency: Secondary | ICD-10-CM | POA: Diagnosis not present

## 2022-04-09 DIAGNOSIS — K219 Gastro-esophageal reflux disease without esophagitis: Secondary | ICD-10-CM | POA: Diagnosis not present

## 2022-04-09 DIAGNOSIS — R7303 Prediabetes: Secondary | ICD-10-CM | POA: Diagnosis not present

## 2022-04-15 DIAGNOSIS — K5900 Constipation, unspecified: Secondary | ICD-10-CM | POA: Diagnosis not present

## 2022-04-15 DIAGNOSIS — D510 Vitamin B12 deficiency anemia due to intrinsic factor deficiency: Secondary | ICD-10-CM | POA: Diagnosis not present

## 2022-04-15 DIAGNOSIS — R7303 Prediabetes: Secondary | ICD-10-CM | POA: Diagnosis not present

## 2022-04-15 DIAGNOSIS — R634 Abnormal weight loss: Secondary | ICD-10-CM | POA: Diagnosis not present

## 2022-04-15 DIAGNOSIS — G4700 Insomnia, unspecified: Secondary | ICD-10-CM | POA: Diagnosis not present

## 2022-04-15 DIAGNOSIS — K219 Gastro-esophageal reflux disease without esophagitis: Secondary | ICD-10-CM | POA: Diagnosis not present

## 2022-04-18 DIAGNOSIS — D510 Vitamin B12 deficiency anemia due to intrinsic factor deficiency: Secondary | ICD-10-CM | POA: Diagnosis not present

## 2022-04-18 DIAGNOSIS — R634 Abnormal weight loss: Secondary | ICD-10-CM | POA: Diagnosis not present

## 2022-04-18 DIAGNOSIS — G4700 Insomnia, unspecified: Secondary | ICD-10-CM | POA: Diagnosis not present

## 2022-04-18 DIAGNOSIS — K5900 Constipation, unspecified: Secondary | ICD-10-CM | POA: Diagnosis not present

## 2022-04-18 DIAGNOSIS — R7303 Prediabetes: Secondary | ICD-10-CM | POA: Diagnosis not present

## 2022-04-18 DIAGNOSIS — K219 Gastro-esophageal reflux disease without esophagitis: Secondary | ICD-10-CM | POA: Diagnosis not present

## 2022-04-22 DIAGNOSIS — K5900 Constipation, unspecified: Secondary | ICD-10-CM | POA: Diagnosis not present

## 2022-04-22 DIAGNOSIS — N401 Enlarged prostate with lower urinary tract symptoms: Secondary | ICD-10-CM | POA: Diagnosis not present

## 2022-04-22 DIAGNOSIS — G301 Alzheimer's disease with late onset: Secondary | ICD-10-CM | POA: Diagnosis not present

## 2022-04-22 DIAGNOSIS — F0280 Dementia in other diseases classified elsewhere without behavioral disturbance: Secondary | ICD-10-CM | POA: Diagnosis not present

## 2022-04-22 DIAGNOSIS — R7303 Prediabetes: Secondary | ICD-10-CM | POA: Diagnosis not present

## 2022-04-22 DIAGNOSIS — K219 Gastro-esophageal reflux disease without esophagitis: Secondary | ICD-10-CM | POA: Diagnosis not present

## 2022-04-22 DIAGNOSIS — D510 Vitamin B12 deficiency anemia due to intrinsic factor deficiency: Secondary | ICD-10-CM | POA: Diagnosis not present

## 2022-04-22 DIAGNOSIS — G4700 Insomnia, unspecified: Secondary | ICD-10-CM | POA: Diagnosis not present

## 2022-04-22 DIAGNOSIS — R634 Abnormal weight loss: Secondary | ICD-10-CM | POA: Diagnosis not present

## 2022-04-22 DIAGNOSIS — E8881 Metabolic syndrome: Secondary | ICD-10-CM | POA: Diagnosis not present

## 2022-04-23 DIAGNOSIS — D510 Vitamin B12 deficiency anemia due to intrinsic factor deficiency: Secondary | ICD-10-CM | POA: Diagnosis not present

## 2022-04-23 DIAGNOSIS — K219 Gastro-esophageal reflux disease without esophagitis: Secondary | ICD-10-CM | POA: Diagnosis not present

## 2022-04-23 DIAGNOSIS — G4700 Insomnia, unspecified: Secondary | ICD-10-CM | POA: Diagnosis not present

## 2022-04-23 DIAGNOSIS — R634 Abnormal weight loss: Secondary | ICD-10-CM | POA: Diagnosis not present

## 2022-04-23 DIAGNOSIS — R7303 Prediabetes: Secondary | ICD-10-CM | POA: Diagnosis not present

## 2022-04-23 DIAGNOSIS — K5900 Constipation, unspecified: Secondary | ICD-10-CM | POA: Diagnosis not present

## 2022-04-24 DIAGNOSIS — I10 Essential (primary) hypertension: Secondary | ICD-10-CM | POA: Diagnosis not present

## 2022-04-24 DIAGNOSIS — R634 Abnormal weight loss: Secondary | ICD-10-CM | POA: Diagnosis not present

## 2022-04-24 DIAGNOSIS — Z8701 Personal history of pneumonia (recurrent): Secondary | ICD-10-CM | POA: Diagnosis not present

## 2022-04-24 DIAGNOSIS — R911 Solitary pulmonary nodule: Secondary | ICD-10-CM | POA: Diagnosis not present

## 2022-04-24 DIAGNOSIS — D849 Immunodeficiency, unspecified: Secondary | ICD-10-CM | POA: Diagnosis not present

## 2022-04-24 DIAGNOSIS — G4700 Insomnia, unspecified: Secondary | ICD-10-CM | POA: Diagnosis not present

## 2022-04-24 DIAGNOSIS — K449 Diaphragmatic hernia without obstruction or gangrene: Secondary | ICD-10-CM | POA: Diagnosis not present

## 2022-04-24 DIAGNOSIS — F0154 Vascular dementia, unspecified severity, with anxiety: Secondary | ICD-10-CM | POA: Diagnosis not present

## 2022-04-24 DIAGNOSIS — N401 Enlarged prostate with lower urinary tract symptoms: Secondary | ICD-10-CM | POA: Diagnosis not present

## 2022-04-24 DIAGNOSIS — D510 Vitamin B12 deficiency anemia due to intrinsic factor deficiency: Secondary | ICD-10-CM | POA: Diagnosis not present

## 2022-04-24 DIAGNOSIS — R7303 Prediabetes: Secondary | ICD-10-CM | POA: Diagnosis not present

## 2022-04-24 DIAGNOSIS — N39498 Other specified urinary incontinence: Secondary | ICD-10-CM | POA: Diagnosis not present

## 2022-04-24 DIAGNOSIS — K5900 Constipation, unspecified: Secondary | ICD-10-CM | POA: Diagnosis not present

## 2022-04-24 DIAGNOSIS — K222 Esophageal obstruction: Secondary | ICD-10-CM | POA: Diagnosis not present

## 2022-04-24 DIAGNOSIS — K219 Gastro-esophageal reflux disease without esophagitis: Secondary | ICD-10-CM | POA: Diagnosis not present

## 2022-04-24 DIAGNOSIS — F01511 Vascular dementia, unspecified severity, with agitation: Secondary | ICD-10-CM | POA: Diagnosis not present

## 2022-04-29 DIAGNOSIS — M79675 Pain in left toe(s): Secondary | ICD-10-CM | POA: Diagnosis not present

## 2022-04-29 DIAGNOSIS — B351 Tinea unguium: Secondary | ICD-10-CM | POA: Diagnosis not present

## 2022-04-29 DIAGNOSIS — M79674 Pain in right toe(s): Secondary | ICD-10-CM | POA: Diagnosis not present

## 2022-05-01 DIAGNOSIS — D510 Vitamin B12 deficiency anemia due to intrinsic factor deficiency: Secondary | ICD-10-CM | POA: Diagnosis not present

## 2022-05-01 DIAGNOSIS — G4700 Insomnia, unspecified: Secondary | ICD-10-CM | POA: Diagnosis not present

## 2022-05-01 DIAGNOSIS — R7303 Prediabetes: Secondary | ICD-10-CM | POA: Diagnosis not present

## 2022-05-01 DIAGNOSIS — R634 Abnormal weight loss: Secondary | ICD-10-CM | POA: Diagnosis not present

## 2022-05-01 DIAGNOSIS — K5900 Constipation, unspecified: Secondary | ICD-10-CM | POA: Diagnosis not present

## 2022-05-01 DIAGNOSIS — K219 Gastro-esophageal reflux disease without esophagitis: Secondary | ICD-10-CM | POA: Diagnosis not present

## 2022-05-02 ENCOUNTER — Ambulatory Visit: Payer: Medicare Other | Admitting: Nurse Practitioner

## 2022-05-05 DIAGNOSIS — K5900 Constipation, unspecified: Secondary | ICD-10-CM | POA: Diagnosis not present

## 2022-05-05 DIAGNOSIS — R634 Abnormal weight loss: Secondary | ICD-10-CM | POA: Diagnosis not present

## 2022-05-05 DIAGNOSIS — D510 Vitamin B12 deficiency anemia due to intrinsic factor deficiency: Secondary | ICD-10-CM | POA: Diagnosis not present

## 2022-05-05 DIAGNOSIS — K219 Gastro-esophageal reflux disease without esophagitis: Secondary | ICD-10-CM | POA: Diagnosis not present

## 2022-05-05 DIAGNOSIS — N401 Enlarged prostate with lower urinary tract symptoms: Secondary | ICD-10-CM | POA: Diagnosis not present

## 2022-05-05 DIAGNOSIS — G4700 Insomnia, unspecified: Secondary | ICD-10-CM | POA: Diagnosis not present

## 2022-05-05 DIAGNOSIS — R7303 Prediabetes: Secondary | ICD-10-CM | POA: Diagnosis not present

## 2022-05-06 DIAGNOSIS — D510 Vitamin B12 deficiency anemia due to intrinsic factor deficiency: Secondary | ICD-10-CM | POA: Diagnosis not present

## 2022-05-06 DIAGNOSIS — G4700 Insomnia, unspecified: Secondary | ICD-10-CM | POA: Diagnosis not present

## 2022-05-06 DIAGNOSIS — R7303 Prediabetes: Secondary | ICD-10-CM | POA: Diagnosis not present

## 2022-05-06 DIAGNOSIS — K5900 Constipation, unspecified: Secondary | ICD-10-CM | POA: Diagnosis not present

## 2022-05-06 DIAGNOSIS — K219 Gastro-esophageal reflux disease without esophagitis: Secondary | ICD-10-CM | POA: Diagnosis not present

## 2022-05-06 DIAGNOSIS — R634 Abnormal weight loss: Secondary | ICD-10-CM | POA: Diagnosis not present

## 2022-05-07 DIAGNOSIS — Z23 Encounter for immunization: Secondary | ICD-10-CM | POA: Diagnosis not present

## 2022-05-20 DIAGNOSIS — K5900 Constipation, unspecified: Secondary | ICD-10-CM | POA: Diagnosis not present

## 2022-05-20 DIAGNOSIS — R634 Abnormal weight loss: Secondary | ICD-10-CM | POA: Diagnosis not present

## 2022-05-20 DIAGNOSIS — R7303 Prediabetes: Secondary | ICD-10-CM | POA: Diagnosis not present

## 2022-05-20 DIAGNOSIS — D510 Vitamin B12 deficiency anemia due to intrinsic factor deficiency: Secondary | ICD-10-CM | POA: Diagnosis not present

## 2022-05-20 DIAGNOSIS — K219 Gastro-esophageal reflux disease without esophagitis: Secondary | ICD-10-CM | POA: Diagnosis not present

## 2022-05-20 DIAGNOSIS — G4700 Insomnia, unspecified: Secondary | ICD-10-CM | POA: Diagnosis not present

## 2022-05-22 DIAGNOSIS — K219 Gastro-esophageal reflux disease without esophagitis: Secondary | ICD-10-CM | POA: Diagnosis not present

## 2022-05-22 DIAGNOSIS — R7303 Prediabetes: Secondary | ICD-10-CM | POA: Diagnosis not present

## 2022-05-22 DIAGNOSIS — D510 Vitamin B12 deficiency anemia due to intrinsic factor deficiency: Secondary | ICD-10-CM | POA: Diagnosis not present

## 2022-05-24 DIAGNOSIS — K219 Gastro-esophageal reflux disease without esophagitis: Secondary | ICD-10-CM | POA: Diagnosis not present

## 2022-05-24 DIAGNOSIS — N401 Enlarged prostate with lower urinary tract symptoms: Secondary | ICD-10-CM | POA: Diagnosis not present

## 2022-05-24 DIAGNOSIS — I10 Essential (primary) hypertension: Secondary | ICD-10-CM | POA: Diagnosis not present

## 2022-05-24 DIAGNOSIS — R911 Solitary pulmonary nodule: Secondary | ICD-10-CM | POA: Diagnosis not present

## 2022-05-24 DIAGNOSIS — D849 Immunodeficiency, unspecified: Secondary | ICD-10-CM | POA: Diagnosis not present

## 2022-05-24 DIAGNOSIS — K5900 Constipation, unspecified: Secondary | ICD-10-CM | POA: Diagnosis not present

## 2022-05-24 DIAGNOSIS — K222 Esophageal obstruction: Secondary | ICD-10-CM | POA: Diagnosis not present

## 2022-05-24 DIAGNOSIS — D510 Vitamin B12 deficiency anemia due to intrinsic factor deficiency: Secondary | ICD-10-CM | POA: Diagnosis not present

## 2022-05-24 DIAGNOSIS — F0390 Unspecified dementia without behavioral disturbance: Secondary | ICD-10-CM | POA: Diagnosis not present

## 2022-05-24 DIAGNOSIS — Z8701 Personal history of pneumonia (recurrent): Secondary | ICD-10-CM | POA: Diagnosis not present

## 2022-05-24 DIAGNOSIS — G4700 Insomnia, unspecified: Secondary | ICD-10-CM | POA: Diagnosis not present

## 2022-05-24 DIAGNOSIS — R2681 Unsteadiness on feet: Secondary | ICD-10-CM | POA: Diagnosis not present

## 2022-05-24 DIAGNOSIS — R634 Abnormal weight loss: Secondary | ICD-10-CM | POA: Diagnosis not present

## 2022-05-24 DIAGNOSIS — F0154 Vascular dementia, unspecified severity, with anxiety: Secondary | ICD-10-CM | POA: Diagnosis not present

## 2022-05-24 DIAGNOSIS — N39498 Other specified urinary incontinence: Secondary | ICD-10-CM | POA: Diagnosis not present

## 2022-05-24 DIAGNOSIS — R7303 Prediabetes: Secondary | ICD-10-CM | POA: Diagnosis not present

## 2022-05-24 DIAGNOSIS — F01511 Vascular dementia, unspecified severity, with agitation: Secondary | ICD-10-CM | POA: Diagnosis not present

## 2022-05-24 DIAGNOSIS — K449 Diaphragmatic hernia without obstruction or gangrene: Secondary | ICD-10-CM | POA: Diagnosis not present

## 2022-06-02 DIAGNOSIS — N401 Enlarged prostate with lower urinary tract symptoms: Secondary | ICD-10-CM | POA: Diagnosis not present

## 2022-06-02 DIAGNOSIS — F0150 Vascular dementia without behavioral disturbance: Secondary | ICD-10-CM | POA: Diagnosis not present

## 2022-06-02 DIAGNOSIS — K219 Gastro-esophageal reflux disease without esophagitis: Secondary | ICD-10-CM | POA: Diagnosis not present

## 2022-06-02 DIAGNOSIS — R7303 Prediabetes: Secondary | ICD-10-CM | POA: Diagnosis not present

## 2022-06-03 DIAGNOSIS — G4700 Insomnia, unspecified: Secondary | ICD-10-CM | POA: Diagnosis not present

## 2022-06-03 DIAGNOSIS — R634 Abnormal weight loss: Secondary | ICD-10-CM | POA: Diagnosis not present

## 2022-06-03 DIAGNOSIS — K5900 Constipation, unspecified: Secondary | ICD-10-CM | POA: Diagnosis not present

## 2022-06-03 DIAGNOSIS — K219 Gastro-esophageal reflux disease without esophagitis: Secondary | ICD-10-CM | POA: Diagnosis not present

## 2022-06-03 DIAGNOSIS — D510 Vitamin B12 deficiency anemia due to intrinsic factor deficiency: Secondary | ICD-10-CM | POA: Diagnosis not present

## 2022-06-03 DIAGNOSIS — R7303 Prediabetes: Secondary | ICD-10-CM | POA: Diagnosis not present

## 2022-06-05 DIAGNOSIS — G301 Alzheimer's disease with late onset: Secondary | ICD-10-CM | POA: Diagnosis not present

## 2022-06-05 DIAGNOSIS — D510 Vitamin B12 deficiency anemia due to intrinsic factor deficiency: Secondary | ICD-10-CM | POA: Diagnosis not present

## 2022-06-11 DIAGNOSIS — Z23 Encounter for immunization: Secondary | ICD-10-CM | POA: Diagnosis not present

## 2022-06-13 ENCOUNTER — Encounter: Payer: Medicare Other | Admitting: Nurse Practitioner

## 2022-06-18 DIAGNOSIS — K5900 Constipation, unspecified: Secondary | ICD-10-CM | POA: Diagnosis not present

## 2022-06-18 DIAGNOSIS — D510 Vitamin B12 deficiency anemia due to intrinsic factor deficiency: Secondary | ICD-10-CM | POA: Diagnosis not present

## 2022-06-18 DIAGNOSIS — G4700 Insomnia, unspecified: Secondary | ICD-10-CM | POA: Diagnosis not present

## 2022-06-18 DIAGNOSIS — R634 Abnormal weight loss: Secondary | ICD-10-CM | POA: Diagnosis not present

## 2022-06-18 DIAGNOSIS — K219 Gastro-esophageal reflux disease without esophagitis: Secondary | ICD-10-CM | POA: Diagnosis not present

## 2022-06-18 DIAGNOSIS — R7303 Prediabetes: Secondary | ICD-10-CM | POA: Diagnosis not present

## 2022-06-23 DIAGNOSIS — R7303 Prediabetes: Secondary | ICD-10-CM | POA: Diagnosis not present

## 2022-06-23 DIAGNOSIS — R911 Solitary pulmonary nodule: Secondary | ICD-10-CM | POA: Diagnosis not present

## 2022-06-23 DIAGNOSIS — N401 Enlarged prostate with lower urinary tract symptoms: Secondary | ICD-10-CM | POA: Diagnosis not present

## 2022-06-23 DIAGNOSIS — R2681 Unsteadiness on feet: Secondary | ICD-10-CM | POA: Diagnosis not present

## 2022-06-23 DIAGNOSIS — K449 Diaphragmatic hernia without obstruction or gangrene: Secondary | ICD-10-CM | POA: Diagnosis not present

## 2022-06-23 DIAGNOSIS — N39498 Other specified urinary incontinence: Secondary | ICD-10-CM | POA: Diagnosis not present

## 2022-06-23 DIAGNOSIS — K5900 Constipation, unspecified: Secondary | ICD-10-CM | POA: Diagnosis not present

## 2022-06-23 DIAGNOSIS — G4700 Insomnia, unspecified: Secondary | ICD-10-CM | POA: Diagnosis not present

## 2022-06-23 DIAGNOSIS — F0390 Unspecified dementia without behavioral disturbance: Secondary | ICD-10-CM | POA: Diagnosis not present

## 2022-06-23 DIAGNOSIS — Z8701 Personal history of pneumonia (recurrent): Secondary | ICD-10-CM | POA: Diagnosis not present

## 2022-06-23 DIAGNOSIS — F0154 Vascular dementia, unspecified severity, with anxiety: Secondary | ICD-10-CM | POA: Diagnosis not present

## 2022-06-23 DIAGNOSIS — I10 Essential (primary) hypertension: Secondary | ICD-10-CM | POA: Diagnosis not present

## 2022-06-23 DIAGNOSIS — F0150 Vascular dementia without behavioral disturbance: Secondary | ICD-10-CM | POA: Diagnosis not present

## 2022-06-23 DIAGNOSIS — K222 Esophageal obstruction: Secondary | ICD-10-CM | POA: Diagnosis not present

## 2022-06-23 DIAGNOSIS — J069 Acute upper respiratory infection, unspecified: Secondary | ICD-10-CM | POA: Diagnosis not present

## 2022-06-23 DIAGNOSIS — F01511 Vascular dementia, unspecified severity, with agitation: Secondary | ICD-10-CM | POA: Diagnosis not present

## 2022-06-23 DIAGNOSIS — D849 Immunodeficiency, unspecified: Secondary | ICD-10-CM | POA: Diagnosis not present

## 2022-06-23 DIAGNOSIS — D510 Vitamin B12 deficiency anemia due to intrinsic factor deficiency: Secondary | ICD-10-CM | POA: Diagnosis not present

## 2022-06-23 DIAGNOSIS — R634 Abnormal weight loss: Secondary | ICD-10-CM | POA: Diagnosis not present

## 2022-06-23 DIAGNOSIS — K219 Gastro-esophageal reflux disease without esophagitis: Secondary | ICD-10-CM | POA: Diagnosis not present

## 2022-06-30 DIAGNOSIS — N401 Enlarged prostate with lower urinary tract symptoms: Secondary | ICD-10-CM | POA: Diagnosis not present

## 2022-06-30 DIAGNOSIS — H02104 Unspecified ectropion of left upper eyelid: Secondary | ICD-10-CM | POA: Diagnosis not present

## 2022-06-30 DIAGNOSIS — H02105 Unspecified ectropion of left lower eyelid: Secondary | ICD-10-CM | POA: Diagnosis not present

## 2022-06-30 DIAGNOSIS — K219 Gastro-esophageal reflux disease without esophagitis: Secondary | ICD-10-CM | POA: Diagnosis not present

## 2022-06-30 DIAGNOSIS — R7303 Prediabetes: Secondary | ICD-10-CM | POA: Diagnosis not present

## 2022-06-30 DIAGNOSIS — F0150 Vascular dementia without behavioral disturbance: Secondary | ICD-10-CM | POA: Diagnosis not present

## 2022-07-01 DIAGNOSIS — R634 Abnormal weight loss: Secondary | ICD-10-CM | POA: Diagnosis not present

## 2022-07-01 DIAGNOSIS — K5900 Constipation, unspecified: Secondary | ICD-10-CM | POA: Diagnosis not present

## 2022-07-01 DIAGNOSIS — R7303 Prediabetes: Secondary | ICD-10-CM | POA: Diagnosis not present

## 2022-07-01 DIAGNOSIS — K219 Gastro-esophageal reflux disease without esophagitis: Secondary | ICD-10-CM | POA: Diagnosis not present

## 2022-07-01 DIAGNOSIS — G4700 Insomnia, unspecified: Secondary | ICD-10-CM | POA: Diagnosis not present

## 2022-07-01 DIAGNOSIS — D510 Vitamin B12 deficiency anemia due to intrinsic factor deficiency: Secondary | ICD-10-CM | POA: Diagnosis not present

## 2022-07-08 DIAGNOSIS — G301 Alzheimer's disease with late onset: Secondary | ICD-10-CM | POA: Diagnosis not present

## 2022-07-08 DIAGNOSIS — D510 Vitamin B12 deficiency anemia due to intrinsic factor deficiency: Secondary | ICD-10-CM | POA: Diagnosis not present

## 2022-07-09 DIAGNOSIS — H2511 Age-related nuclear cataract, right eye: Secondary | ICD-10-CM | POA: Diagnosis not present

## 2022-07-09 DIAGNOSIS — H02105 Unspecified ectropion of left lower eyelid: Secondary | ICD-10-CM | POA: Diagnosis not present

## 2022-07-10 DIAGNOSIS — M79674 Pain in right toe(s): Secondary | ICD-10-CM | POA: Diagnosis not present

## 2022-07-10 DIAGNOSIS — B351 Tinea unguium: Secondary | ICD-10-CM | POA: Diagnosis not present

## 2022-07-10 DIAGNOSIS — M79675 Pain in left toe(s): Secondary | ICD-10-CM | POA: Diagnosis not present

## 2022-07-15 DIAGNOSIS — K5900 Constipation, unspecified: Secondary | ICD-10-CM | POA: Diagnosis not present

## 2022-07-15 DIAGNOSIS — R634 Abnormal weight loss: Secondary | ICD-10-CM | POA: Diagnosis not present

## 2022-07-15 DIAGNOSIS — G4700 Insomnia, unspecified: Secondary | ICD-10-CM | POA: Diagnosis not present

## 2022-07-15 DIAGNOSIS — K219 Gastro-esophageal reflux disease without esophagitis: Secondary | ICD-10-CM | POA: Diagnosis not present

## 2022-07-15 DIAGNOSIS — D510 Vitamin B12 deficiency anemia due to intrinsic factor deficiency: Secondary | ICD-10-CM | POA: Diagnosis not present

## 2022-07-15 DIAGNOSIS — R7303 Prediabetes: Secondary | ICD-10-CM | POA: Diagnosis not present

## 2022-07-21 DIAGNOSIS — G4700 Insomnia, unspecified: Secondary | ICD-10-CM | POA: Diagnosis not present

## 2022-07-21 DIAGNOSIS — D510 Vitamin B12 deficiency anemia due to intrinsic factor deficiency: Secondary | ICD-10-CM | POA: Diagnosis not present

## 2022-07-21 DIAGNOSIS — R634 Abnormal weight loss: Secondary | ICD-10-CM | POA: Diagnosis not present

## 2022-07-21 DIAGNOSIS — K5900 Constipation, unspecified: Secondary | ICD-10-CM | POA: Diagnosis not present

## 2022-07-21 DIAGNOSIS — H02104 Unspecified ectropion of left upper eyelid: Secondary | ICD-10-CM | POA: Diagnosis not present

## 2022-07-21 DIAGNOSIS — F5101 Primary insomnia: Secondary | ICD-10-CM | POA: Diagnosis not present

## 2022-07-21 DIAGNOSIS — H02105 Unspecified ectropion of left lower eyelid: Secondary | ICD-10-CM | POA: Diagnosis not present

## 2022-07-21 DIAGNOSIS — N401 Enlarged prostate with lower urinary tract symptoms: Secondary | ICD-10-CM | POA: Diagnosis not present

## 2022-07-21 DIAGNOSIS — R7303 Prediabetes: Secondary | ICD-10-CM | POA: Diagnosis not present

## 2022-07-21 DIAGNOSIS — K219 Gastro-esophageal reflux disease without esophagitis: Secondary | ICD-10-CM | POA: Diagnosis not present

## 2022-07-21 DIAGNOSIS — F0280 Dementia in other diseases classified elsewhere without behavioral disturbance: Secondary | ICD-10-CM | POA: Diagnosis not present

## 2022-07-22 DIAGNOSIS — N401 Enlarged prostate with lower urinary tract symptoms: Secondary | ICD-10-CM | POA: Diagnosis not present

## 2022-07-22 DIAGNOSIS — G301 Alzheimer's disease with late onset: Secondary | ICD-10-CM | POA: Diagnosis not present

## 2022-07-22 DIAGNOSIS — K2270 Barrett's esophagus without dysplasia: Secondary | ICD-10-CM | POA: Diagnosis not present

## 2022-07-22 DIAGNOSIS — F0280 Dementia in other diseases classified elsewhere without behavioral disturbance: Secondary | ICD-10-CM | POA: Diagnosis not present

## 2022-07-22 DIAGNOSIS — R7303 Prediabetes: Secondary | ICD-10-CM | POA: Diagnosis not present

## 2022-07-30 DIAGNOSIS — D510 Vitamin B12 deficiency anemia due to intrinsic factor deficiency: Secondary | ICD-10-CM | POA: Diagnosis not present

## 2022-07-30 DIAGNOSIS — R7303 Prediabetes: Secondary | ICD-10-CM | POA: Diagnosis not present

## 2022-07-30 DIAGNOSIS — K219 Gastro-esophageal reflux disease without esophagitis: Secondary | ICD-10-CM | POA: Diagnosis not present

## 2022-08-07 DIAGNOSIS — G301 Alzheimer's disease with late onset: Secondary | ICD-10-CM | POA: Diagnosis not present

## 2022-08-07 DIAGNOSIS — D510 Vitamin B12 deficiency anemia due to intrinsic factor deficiency: Secondary | ICD-10-CM | POA: Diagnosis not present

## 2023-01-24 ENCOUNTER — Emergency Department (HOSPITAL_COMMUNITY): Payer: Medicare Other

## 2023-01-24 ENCOUNTER — Other Ambulatory Visit: Payer: Self-pay

## 2023-01-24 ENCOUNTER — Encounter (HOSPITAL_COMMUNITY): Payer: Self-pay

## 2023-01-24 ENCOUNTER — Emergency Department (HOSPITAL_COMMUNITY)
Admission: EM | Admit: 2023-01-24 | Discharge: 2023-01-24 | Disposition: A | Discharge status: Home / Self Care | Payer: Medicare Other | Attending: Emergency Medicine | Admitting: Emergency Medicine

## 2023-01-24 DIAGNOSIS — S51012A Laceration without foreign body of left elbow, initial encounter: Secondary | ICD-10-CM | POA: Diagnosis not present

## 2023-01-24 DIAGNOSIS — R059 Cough, unspecified: Secondary | ICD-10-CM

## 2023-01-24 DIAGNOSIS — S20212A Contusion of left front wall of thorax, initial encounter: Secondary | ICD-10-CM | POA: Diagnosis not present

## 2023-01-24 DIAGNOSIS — Z1152 Encounter for screening for COVID-19: Secondary | ICD-10-CM | POA: Diagnosis not present

## 2023-01-24 DIAGNOSIS — F0390 Unspecified dementia without behavioral disturbance: Secondary | ICD-10-CM | POA: Diagnosis not present

## 2023-01-24 DIAGNOSIS — W06XXXA Fall from bed, initial encounter: Secondary | ICD-10-CM | POA: Diagnosis not present

## 2023-01-24 DIAGNOSIS — N390 Urinary tract infection, site not specified: Secondary | ICD-10-CM | POA: Diagnosis not present

## 2023-01-24 DIAGNOSIS — S59912A Unspecified injury of left forearm, initial encounter: Secondary | ICD-10-CM | POA: Diagnosis present

## 2023-01-24 LAB — URINALYSIS, ROUTINE W REFLEX MICROSCOPIC
Bilirubin Urine: NEGATIVE
Glucose, UA: NEGATIVE mg/dL
Ketones, ur: NEGATIVE mg/dL
Nitrite: NEGATIVE
Protein, ur: 100 mg/dL — AB
Specific Gravity, Urine: 1.016 (ref 1.005–1.030)
WBC, UA: >50 WBC/hpf (ref 0–5)
pH: 6 (ref 5.0–8.0)

## 2023-01-24 LAB — COMPREHENSIVE METABOLIC PANEL
ALT: 35 U/L (ref 0–44)
AST: 131 U/L — ABNORMAL HIGH (ref 15–41)
Albumin: 3.5 g/dL (ref 3.5–5.0)
Alkaline Phosphatase: 54 U/L (ref 38–126)
Anion gap: 9 (ref 5–15)
BUN: 22 mg/dL (ref 8–23)
CO2: 23 mmol/L (ref 22–32)
Calcium: 8.5 mg/dL — ABNORMAL LOW (ref 8.9–10.3)
Chloride: 103 mmol/L (ref 98–111)
Creatinine, Ser: 1.21 mg/dL (ref 0.61–1.24)
GFR, Estimated: 59 mL/min — ABNORMAL LOW (ref >60)
Glucose, Bld: 127 mg/dL — ABNORMAL HIGH (ref 70–99)
Potassium: 4.6 mmol/L (ref 3.5–5.1)
Sodium: 135 mmol/L (ref 135–145)
Total Bilirubin: 2.9 mg/dL — ABNORMAL HIGH (ref 0.3–1.2)
Total Protein: 6.4 g/dL — ABNORMAL LOW (ref 6.5–8.1)

## 2023-01-24 LAB — CBC WITH DIFFERENTIAL/PLATELET
Abs Immature Granulocytes: 0.04 10*3/uL (ref 0.00–0.07)
Basophils Absolute: 0.1 10*3/uL (ref 0.0–0.1)
Basophils Relative: 1 %
Eosinophils Absolute: 0.1 10*3/uL (ref 0.0–0.5)
Eosinophils Relative: 1 %
HCT: 45.9 % (ref 39.0–52.0)
Hemoglobin: 15 g/dL (ref 13.0–17.0)
Immature Granulocytes: 0 %
Lymphocytes Relative: 8 %
Lymphs Abs: 0.8 10*3/uL (ref 0.7–4.0)
MCH: 32.7 pg (ref 26.0–34.0)
MCHC: 32.7 g/dL (ref 30.0–36.0)
MCV: 100 fL (ref 80.0–100.0)
Monocytes Absolute: 0.7 10*3/uL (ref 0.1–1.0)
Monocytes Relative: 7 %
Neutro Abs: 8.6 10*3/uL — ABNORMAL HIGH (ref 1.7–7.7)
Neutrophils Relative %: 83 %
Platelets: 112 10*3/uL — ABNORMAL LOW (ref 150–400)
RBC: 4.59 MIL/uL (ref 4.22–5.81)
RDW: 14.6 % (ref 11.5–15.5)
WBC: 10.2 10*3/uL (ref 4.0–10.5)
nRBC: 0 % (ref 0.0–0.2)

## 2023-01-24 LAB — SARS CORONAVIRUS 2 BY RT PCR: SARS Coronavirus 2 by RT PCR: NEGATIVE

## 2023-01-24 MED ORDER — SODIUM CHLORIDE 0.9 % IV SOLN
2.0000 g | Freq: Once | INTRAVENOUS | Status: AC
  Administered 2023-01-24: 2 g via INTRAVENOUS
  Filled 2023-01-24: qty 20

## 2023-01-24 NOTE — ED Notes (Signed)
Pt ambulated with 2 assist. Pt needs assistance to keep balance while walking. Pt placed back into bed with bed in lowest position.

## 2023-01-24 NOTE — ED Triage Notes (Signed)
[facility] and sent for unwitnessed fall but staff heard it.  Patient denies pain and has skin tear to left elbow.  Staff reports patient had temp of 100.4 this am and has been in bed.  Also reports cough.

## 2023-01-24 NOTE — ED Notes (Signed)
Ptar cancelled 

## 2023-01-24 NOTE — ED Provider Notes (Addendum)
[facility] Provider Note   CSN: [id number] Arrival date & time: 01/24/23  1437     History  Chief Complaint  Patient presents with   Andrew Osborne is a 82 y.o. male.  Patient is here from nursing facility.  EMS reports that patient did not get out of bed today because he was not feeling well.  Patient is reported to have fallen out of bed and injured his left arm.  The fall was unwitnessed.  Staff report hearing the fall but did not witness it.  The history is provided by the patient. No language interpreter was used.  Fall This is a new problem. The current episode started less than 1 hour ago. Nothing aggravates the symptoms.       Home Medications Prior to Admission medications   Medication Sig Start Date End Date Taking? Authorizing Provider  betamethasone dipropionate 0.05 % cream Apply topically 2 (two) times daily. 03/11/21   Felecia Shelling, DPM  calcium carbonate (TUMS - DOSED IN MG ELEMENTAL CALCIUM) 500 MG chewable tablet Chew 2 tablets (400 mg of elemental calcium total) by mouth every 6 (six) hours as needed for indigestion or heartburn. 03/18/21   Sharon Seller, NP  Cyanocobalamin (B-12 COMPLIANCE INJECTION IJ) Inject 1,000 mcg as directed every 14 (fourteen) days.    [provider]  donepezil (ARICEPT) 10 MG tablet Take 10 mg by mouth daily. 10/14/21   [provider]  donepezil (ARICEPT) 5 MG tablet Take 1 tablet (5 mg total) by mouth at bedtime. For 1 month then take 10mg  by mouth once daily at bedtime. 08/16/21   Sharon Seller, NP  erythromycin ophthalmic ointment SMARTSIG:In Eye(s) 10/01/21   [provider]  finasteride (PROSCAR) 5 MG tablet Take 5 mg by mouth daily. 10/08/21   [provider]  meclizine (ANTIVERT) 12.5 MG tablet Take 1 tablet (12.5 mg total) by mouth every 8 (eight) hours as needed for dizziness. 12/12/21   Sharon Seller, NP  memantine (NAMENDA)  10 MG tablet TAKE 1 TABLET BY MOUTH TWICE A DAY 03/14/21   Ngetich, Dinah C, NP  Nutritional Supplements (ENSURE NUTRA SHAKE HI-CAL) LIQD To offer 1 can of ensure daily for nutritional supplement due to weight loss 05/03/21   Sharon Seller, NP  olopatadine (PATANOL) 0.1 % ophthalmic solution SMARTSIG:In Eye(s) 10/03/21   [provider]  omeprazole (PRILOSEC) 40 MG capsule Take 1 capsule (40 mg total) by mouth daily. 09/03/20   Reed, Tiffany L, DO  Propylene Glycol (SYSTANE BALANCE) 0.6 % SOLN Place 1 drop into both eyes 2 (two) times daily. 07/21/19   Ngetich, Dinah C, NP  tamsulosin (FLOMAX) 0.4 MG CAPS capsule TAKE 1 CAPSULE BY MOUTH EVERYDAY AT BEDTIME 10/22/20   Sharon Seller, NP  traZODone (DESYREL) 50 MG tablet TAKE 1 TABLET BY MOUTH EVERYDAY AT BEDTIME 04/05/21   Sharon Seller, NP  UNABLE TO FIND Use Debrox 4 drops twice daily to bilateral ears for 4 days and nursing staff to flush ears 10/31/21   Sharon Seller, NP      Allergies    Nizoral [ketoconazole]    Review of Systems   Review of Systems  Respiratory:  Negative for cough.   All other systems reviewed and are negative.   Physical Exam Updated Vital Signs BP 97/68   Pulse 66   Temp (!) 97.4 F (36.3 C) (Oral)   Resp  17   Ht 5\' 6"  (1.676 m)   Wt 68.5 kg   SpO2 96%   BMI 24.37 kg/m  Physical Exam Vitals and nursing note reviewed.  Constitutional:      Appearance: He is well-developed.  HENT:     Head: Normocephalic.     Right Ear: External ear normal.     Left Ear: External ear normal.     Nose: Nose normal.     Mouth/Throat:     Mouth: Mucous membranes are moist.  Eyes:     Pupils: Pupils are equal, round, and reactive to light.  Cardiovascular:     Rate and Rhythm: Normal rate and regular rhythm.  Pulmonary:     Effort: Pulmonary effort is normal.     Breath sounds: Rhonchi present.  Abdominal:     General: Abdomen is flat. There is no distension.  Musculoskeletal:        General:  Normal range of motion.     Cervical back: Normal range of motion.     Comments: 1.5 cm skin tear left elbow, bruising left posterior chest.  Skin:    General: Skin is warm.  Neurological:     Mental Status: He is alert and oriented to person, place, and time.  Psychiatric:        Mood and Affect: Mood normal.     ED Results / Procedures / Treatments   Labs (all labs ordered are listed, but only abnormal results are displayed) Labs Reviewed  CBC WITH DIFFERENTIAL/PLATELET - Abnormal; Notable for the following components:      Result Value   Platelets 112 (*)    Neutro Abs 8.6 (*)    All other components within normal limits  COMPREHENSIVE METABOLIC PANEL - Abnormal; Notable for the following components:   Glucose, Bld 127 (*)    Calcium 8.5 (*)    Total Protein 6.4 (*)    AST 131 (*)    Total Bilirubin 2.9 (*)    GFR, Estimated 59 (*)    All other components within normal limits  URINALYSIS, ROUTINE W REFLEX MICROSCOPIC - Abnormal; Notable for the following components:   Color, Urine AMBER (*)    APPearance TURBID (*)    Hgb urine dipstick MODERATE (*)    Protein, ur 100 (*)    Leukocytes,Ua LARGE (*)    Bacteria, UA RARE (*)    All other components within normal limits  SARS CORONAVIRUS 2 BY RT PCR    EKG None  Radiology CT Cervical Spine Wo Contrast  Result Date: 01/24/2023 CLINICAL DATA:  Trauma, fall EXAM: CT CERVICAL SPINE WITHOUT CONTRAST TECHNIQUE: Multidetector CT imaging of the cervical spine was performed without intravenous contrast. Multiplanar CT image reconstructions were also generated. RADIATION DOSE REDUCTION: This exam was performed according to the departmental dose-optimization program which includes automated exposure control, adjustment of the mA and/or kV according to patient size and/or use of iterative reconstruction technique. COMPARISON:  03/23/2021 FINDINGS: Alignment: There is minimal retrolisthesis at C3-C4 level with no significant interval  change. There is mild dextroscoliosis. Skull base and vertebrae: No recent fracture is seen. Soft tissues and spinal canal: There is no central spinal stenosis. Disc levels: There is encroachment of neural foramina from C2-C7 levels. Upper chest: No acute findings are seen. There is interval clearing of infiltrate in right upper lung field. Other: None. IMPRESSION: No recent fracture is seen. Cervical spondylosis with encroachment of neural foramina at multiple levels. No significant interval changes are noted  in the bony structures. Electronically Signed   By: Ernie Avena M.D.   On: 01/24/2023 16:02   CT Head Wo Contrast  Result Date: 01/24/2023 CLINICAL DATA:  Trauma, fall EXAM: CT HEAD WITHOUT CONTRAST TECHNIQUE: Contiguous axial images were obtained from the base of the skull through the vertex without intravenous contrast. RADIATION DOSE REDUCTION: This exam was performed according to the departmental dose-optimization program which includes automated exposure control, adjustment of the mA and/or kV according to patient size and/or use of iterative reconstruction technique. COMPARISON:  03/23/2021 FINDINGS: Brain: No acute intracranial findings are seen. There are no signs of bleeding within the cranium. Cortical sulci are prominent. There is decreased density in periventricular white matter. There is prominence of both lateral ventricles and third ventricle. Vascular: Unremarkable. Skull: No fracture is seen. Sinuses/Orbits: There is mucosal thickening in ethmoid sinus. There is previous cataract surgery in the left optic globe. Other: None. IMPRESSION: No acute intracranial findings are seen in noncontrast CT brain. Atrophy. Small-vessel disease. Chronic sinusitis. Electronically Signed   By: Ernie Avena M.D.   On: 01/24/2023 15:56   DG Elbow Complete Left  Result Date: 01/24/2023 CLINICAL DATA:  Fall.  Left elbow injury and pain. EXAM: LEFT ELBOW - COMPLETE 3+ VIEW COMPARISON:  None  Available. FINDINGS: There is no evidence of fracture, dislocation, or joint effusion. Small olecranon process bone spur noted. IMPRESSION: No acute findings. Electronically Signed   By: Danae Orleans M.D.   On: 01/24/2023 15:41   DG Chest 2 View  Result Date: 01/24/2023 CLINICAL DATA:  Fever EXAM: CHEST - 2 VIEW COMPARISON:  10/30/2018 FINDINGS: The heart size and mediastinal contours are within normal limits. Both lungs are clear. The visualized skeletal structures are unremarkable. IMPRESSION: No active cardiopulmonary disease. Electronically Signed   By: Danae Orleans M.D.   On: 01/24/2023 15:40    Procedures Procedures    Medications Ordered in ED Medications - No data to display  ED Course/ Medical Decision Making/ A&P                             Medical Decision Making Patient is reported to have not been feeling well today and did not get out of bed patient is reported to have fallen out of the bed the fall was unwitnessed  Amount and/or Complexity of Data Reviewed Independent Historian: EMS    Details: EMS reports patient fell out of the bed. Family member is present she reports patient does have "a history of recurring UTIs.  Patient has a history of dementia.  He has had a history of chronic elevated bilirubin. Labs: ordered. Decision-making details documented in ED Course.    Details: Labs ordered reviewed and interpreted patient's total bilirubin is 2.9 patient's UA shows greater than 50 white blood cells and bacteria.  Testing is negative Radiology: ordered and independent interpretation performed. Decision-making details documented in ED Course.    Details: CT head and cervical spine show no evidence of injury.  X-ray left elbow is negative.  Chest x-ray shows no evidence of pneumonia  Risk Risk Details: Patient is given IV Rocephin 2 g.  Patient is placed on a prescription for Keflex 500 mg 4 times daily.  Urine culture is pending.  Wounds are cleaned.  Patient is stable  to return to facility.  Patient should return to the emergency department if fever or increasing weakness  Final Clinical Impression(s) / ED Diagnoses Final diagnoses:  Hyperbilirubinemia  Urinary tract infection without hematuria, site unspecified  Laceration of left elbow, initial encounter  Cough, unspecified type  Dementia, unspecified dementia severity, unspecified dementia type, unspecified whether behavioral, psychotic, or mood disturbance or anxiety (HCC)    Rx / DC Orders ED Discharge Orders     None      An After Visit Summary was printed and given to the patient.    Elson Areas, PA-C 01/24/23 1911    Elson Areas, PA-C 01/24/23 1944    Derwood Kaplan, MD 01/24/23 [phone]

## 2023-01-24 NOTE — ED Notes (Signed)
Ptar called 

## 2023-02-27 ENCOUNTER — Emergency Department (HOSPITAL_COMMUNITY): Payer: Medicare Other

## 2023-02-27 ENCOUNTER — Other Ambulatory Visit: Payer: Self-pay

## 2023-02-27 ENCOUNTER — Inpatient Hospital Stay (HOSPITAL_COMMUNITY)
Admission: EM | Admit: 2023-02-27 | Discharge: 2023-03-02 | DRG: 871 | Disposition: A | Discharge status: Skilled Nursing Facility | Payer: Medicare Other | Source: Skilled Nursing Facility | Attending: Internal Medicine | Admitting: Internal Medicine

## 2023-02-27 ENCOUNTER — Encounter (HOSPITAL_COMMUNITY): Payer: Self-pay

## 2023-02-27 DIAGNOSIS — Z961 Presence of intraocular lens: Secondary | ICD-10-CM | POA: Diagnosis present

## 2023-02-27 DIAGNOSIS — N390 Urinary tract infection, site not specified: Secondary | ICD-10-CM | POA: Diagnosis not present

## 2023-02-27 DIAGNOSIS — A4151 Sepsis due to Escherichia coli [E. coli]: Secondary | ICD-10-CM | POA: Diagnosis not present

## 2023-02-27 DIAGNOSIS — F0150 Vascular dementia without behavioral disturbance: Secondary | ICD-10-CM | POA: Diagnosis present

## 2023-02-27 DIAGNOSIS — R7303 Prediabetes: Secondary | ICD-10-CM | POA: Diagnosis present

## 2023-02-27 DIAGNOSIS — A419 Sepsis, unspecified organism: Secondary | ICD-10-CM | POA: Diagnosis not present

## 2023-02-27 DIAGNOSIS — Z79899 Other long term (current) drug therapy: Secondary | ICD-10-CM

## 2023-02-27 DIAGNOSIS — G9341 Metabolic encephalopathy: Secondary | ICD-10-CM | POA: Diagnosis present

## 2023-02-27 DIAGNOSIS — E785 Hyperlipidemia, unspecified: Secondary | ICD-10-CM | POA: Diagnosis present

## 2023-02-27 DIAGNOSIS — Z86008 Personal history of in-situ neoplasm of other site: Secondary | ICD-10-CM | POA: Diagnosis not present

## 2023-02-27 DIAGNOSIS — Z1152 Encounter for screening for COVID-19: Secondary | ICD-10-CM

## 2023-02-27 DIAGNOSIS — Z85828 Personal history of other malignant neoplasm of skin: Secondary | ICD-10-CM | POA: Diagnosis not present

## 2023-02-27 DIAGNOSIS — M542 Cervicalgia: Secondary | ICD-10-CM | POA: Diagnosis present

## 2023-02-27 DIAGNOSIS — Z66 Do not resuscitate: Secondary | ICD-10-CM | POA: Diagnosis present

## 2023-02-27 DIAGNOSIS — S0003XA Contusion of scalp, initial encounter: Secondary | ICD-10-CM | POA: Diagnosis present

## 2023-02-27 DIAGNOSIS — E860 Dehydration: Secondary | ICD-10-CM | POA: Diagnosis present

## 2023-02-27 DIAGNOSIS — R6521 Severe sepsis with septic shock: Secondary | ICD-10-CM | POA: Diagnosis present

## 2023-02-27 DIAGNOSIS — R6520 Severe sepsis without septic shock: Secondary | ICD-10-CM | POA: Diagnosis present

## 2023-02-27 DIAGNOSIS — N3000 Acute cystitis without hematuria: Secondary | ICD-10-CM

## 2023-02-27 DIAGNOSIS — Z888 Allergy status to other drugs, medicaments and biological substances status: Secondary | ICD-10-CM

## 2023-02-27 DIAGNOSIS — K219 Gastro-esophageal reflux disease without esophagitis: Secondary | ICD-10-CM | POA: Diagnosis present

## 2023-02-27 DIAGNOSIS — D696 Thrombocytopenia, unspecified: Secondary | ICD-10-CM | POA: Diagnosis present

## 2023-02-27 DIAGNOSIS — L89321 Pressure ulcer of left buttock, stage 1: Secondary | ICD-10-CM | POA: Diagnosis present

## 2023-02-27 DIAGNOSIS — Z808 Family history of malignant neoplasm of other organs or systems: Secondary | ICD-10-CM

## 2023-02-27 DIAGNOSIS — I959 Hypotension, unspecified: Secondary | ICD-10-CM | POA: Diagnosis present

## 2023-02-27 DIAGNOSIS — Z9842 Cataract extraction status, left eye: Secondary | ICD-10-CM

## 2023-02-27 DIAGNOSIS — Z87440 Personal history of urinary (tract) infections: Secondary | ICD-10-CM | POA: Diagnosis not present

## 2023-02-27 DIAGNOSIS — N400 Enlarged prostate without lower urinary tract symptoms: Secondary | ICD-10-CM | POA: Diagnosis present

## 2023-02-27 DIAGNOSIS — Z833 Family history of diabetes mellitus: Secondary | ICD-10-CM

## 2023-02-27 DIAGNOSIS — B353 Tinea pedis: Secondary | ICD-10-CM | POA: Diagnosis present

## 2023-02-27 DIAGNOSIS — W19XXXA Unspecified fall, initial encounter: Secondary | ICD-10-CM | POA: Diagnosis present

## 2023-02-27 DIAGNOSIS — R7989 Other specified abnormal findings of blood chemistry: Secondary | ICD-10-CM | POA: Diagnosis present

## 2023-02-27 LAB — COMPREHENSIVE METABOLIC PANEL
ALT: 43 U/L (ref 0–44)
AST: 108 U/L — ABNORMAL HIGH (ref 15–41)
Albumin: 3 g/dL — ABNORMAL LOW (ref 3.5–5.0)
Alkaline Phosphatase: 52 U/L (ref 38–126)
Anion gap: 13 (ref 5–15)
BUN: 25 mg/dL — ABNORMAL HIGH (ref 8–23)
CO2: 20 mmol/L — ABNORMAL LOW (ref 22–32)
Calcium: 8.2 mg/dL — ABNORMAL LOW (ref 8.9–10.3)
Chloride: 104 mmol/L (ref 98–111)
Creatinine, Ser: 1.43 mg/dL — ABNORMAL HIGH (ref 0.61–1.24)
GFR, Estimated: 49 mL/min — ABNORMAL LOW (ref >60)
Glucose, Bld: 159 mg/dL — ABNORMAL HIGH (ref 70–99)
Potassium: 3.7 mmol/L (ref 3.5–5.1)
Sodium: 137 mmol/L (ref 135–145)
Total Bilirubin: 2.4 mg/dL — ABNORMAL HIGH (ref 0.3–1.2)
Total Protein: 5.6 g/dL — ABNORMAL LOW (ref 6.5–8.1)

## 2023-02-27 LAB — CBC WITH DIFFERENTIAL/PLATELET
Abs Immature Granulocytes: 0 10*3/uL (ref 0.00–0.07)
Basophils Absolute: 0.2 10*3/uL — ABNORMAL HIGH (ref 0.0–0.1)
Basophils Relative: 1 %
Eosinophils Absolute: 0 10*3/uL (ref 0.0–0.5)
Eosinophils Relative: 0 %
HCT: 42.7 % (ref 39.0–52.0)
Hemoglobin: 14.4 g/dL (ref 13.0–17.0)
Lymphocytes Relative: 1 %
Lymphs Abs: 0.2 10*3/uL — ABNORMAL LOW (ref 0.7–4.0)
MCH: 32.4 pg (ref 26.0–34.0)
MCHC: 33.7 g/dL (ref 30.0–36.0)
MCV: 96 fL (ref 80.0–100.0)
Monocytes Absolute: 0.9 10*3/uL (ref 0.1–1.0)
Monocytes Relative: 4 %
Neutro Abs: 20.1 10*3/uL — ABNORMAL HIGH (ref 1.7–7.7)
Neutrophils Relative %: 94 %
Platelets: 106 10*3/uL — ABNORMAL LOW (ref 150–400)
RBC: 4.45 MIL/uL (ref 4.22–5.81)
RDW: 14.6 % (ref 11.5–15.5)
WBC: 21.4 10*3/uL — ABNORMAL HIGH (ref 4.0–10.5)
nRBC: 0 % (ref 0.0–0.2)
nRBC: 0 /100 WBC

## 2023-02-27 LAB — URINALYSIS, W/ REFLEX TO CULTURE (INFECTION SUSPECTED)
Bilirubin Urine: NEGATIVE
Glucose, UA: NEGATIVE mg/dL
Ketones, ur: NEGATIVE mg/dL
Nitrite: NEGATIVE
Protein, ur: 100 mg/dL — AB
RBC / HPF: >50 RBC/hpf (ref 0–5)
Specific Gravity, Urine: 1.012 (ref 1.005–1.030)
WBC, UA: >50 WBC/hpf (ref 0–5)
pH: 5 (ref 5.0–8.0)

## 2023-02-27 LAB — I-STAT CHEM 8, ED
BUN: 24 mg/dL — ABNORMAL HIGH (ref 8–23)
Calcium, Ion: 1.09 mmol/L — ABNORMAL LOW (ref 1.15–1.40)
Chloride: 105 mmol/L (ref 98–111)
Creatinine, Ser: 1.3 mg/dL — ABNORMAL HIGH (ref 0.61–1.24)
Glucose, Bld: 166 mg/dL — ABNORMAL HIGH (ref 70–99)
HCT: 41 % (ref 39.0–52.0)
Hemoglobin: 13.9 g/dL (ref 13.0–17.0)
Potassium: 3.7 mmol/L (ref 3.5–5.1)
Sodium: 140 mmol/L (ref 135–145)
TCO2: 20 mmol/L — ABNORMAL LOW (ref 22–32)

## 2023-02-27 LAB — PROTIME-INR
INR: 1.2 (ref 0.8–1.2)
Prothrombin Time: 15.2 seconds (ref 11.4–15.2)

## 2023-02-27 LAB — LACTIC ACID, PLASMA: Lactic Acid, Venous: 2.4 mmol/L (ref 0.5–1.9)

## 2023-02-27 LAB — TROPONIN I (HIGH SENSITIVITY)
Troponin I (High Sensitivity): 30 ng/L — ABNORMAL HIGH (ref <18)
Troponin I (High Sensitivity): 35 ng/L — ABNORMAL HIGH (ref <18)

## 2023-02-27 LAB — I-STAT CG4 LACTIC ACID, ED
Lactic Acid, Venous: 4.2 mmol/L (ref 0.5–1.9)
Lactic Acid, Venous: 3.8 mmol/L (ref 0.5–1.9)

## 2023-02-27 LAB — APTT: aPTT: 30 seconds (ref 24–36)

## 2023-02-27 LAB — SARS CORONAVIRUS 2 BY RT PCR: SARS Coronavirus 2 by RT PCR: NEGATIVE

## 2023-02-27 LAB — PROCALCITONIN: Procalcitonin: 55.04 ng/mL

## 2023-02-27 LAB — AMMONIA: Ammonia: 27 umol/L (ref 9–35)

## 2023-02-27 LAB — GLUCOSE, CAPILLARY: Glucose-Capillary: 84 mg/dL (ref 70–99)

## 2023-02-27 MED ORDER — LACTATED RINGERS IV SOLN: INTRAVENOUS | Status: DC

## 2023-02-27 MED ORDER — LACTATED RINGERS IV BOLUS
500.0000 mL | Freq: Once | INTRAVENOUS | Status: AC
  Administered 2023-02-27: 500 mL via INTRAVENOUS

## 2023-02-27 MED ORDER — LACTATED RINGERS IV BOLUS
1000.0000 mL | Freq: Once | INTRAVENOUS | Status: AC
  Administered 2023-02-27: 1000 mL via INTRAVENOUS

## 2023-02-27 MED ORDER — TRAZODONE HCL 50 MG PO TABS
50.0000 mg | ORAL_TABLET | Freq: Every day | ORAL | Status: DC
  Administered 2023-02-27 – 2023-03-01 (×3): 50 mg via ORAL
  Filled 2023-02-27 (×3): qty 1

## 2023-02-27 MED ORDER — OLOPATADINE HCL 0.1 % OP SOLN
1.0000 [drp] | Freq: Two times a day (BID) | OPHTHALMIC | Status: DC
  Administered 2023-02-27 – 2023-03-02 (×6): 1 [drp] via OPHTHALMIC
  Filled 2023-02-27: qty 5

## 2023-02-27 MED ORDER — DONEPEZIL HCL 10 MG PO TABS
10.0000 mg | ORAL_TABLET | Freq: Every day | ORAL | Status: DC
  Administered 2023-02-27 – 2023-03-01 (×3): 10 mg via ORAL
  Filled 2023-02-27 (×3): qty 1

## 2023-02-27 MED ORDER — TAMSULOSIN HCL 0.4 MG PO CAPS: 0.4000 mg | ORAL_CAPSULE | Freq: Every day | ORAL | Status: DC

## 2023-02-27 MED ORDER — SODIUM CHLORIDE 0.9 % IV SOLN
2.0000 g | INTRAVENOUS | Status: DC
  Administered 2023-02-27 – 2023-03-01 (×3): 2 g via INTRAVENOUS
  Filled 2023-02-27 (×2): qty 20

## 2023-02-27 MED ORDER — ERYTHROMYCIN 5 MG/GM OP OINT
1.0000 | TOPICAL_OINTMENT | Freq: Every day | OPHTHALMIC | Status: DC
  Administered 2023-02-27 – 2023-03-01 (×3): 1 via OPHTHALMIC
  Filled 2023-02-27: qty 3.5

## 2023-02-27 MED ORDER — SODIUM CHLORIDE 0.9 % IV BOLUS (SEPSIS)
1000.0000 mL | Freq: Once | INTRAVENOUS | Status: AC
  Administered 2023-02-27: 1000 mL via INTRAVENOUS

## 2023-02-27 MED ORDER — SODIUM CHLORIDE 0.9 % IV BOLUS (SEPSIS): 1000.0000 mL | Freq: Once | INTRAVENOUS | Status: DC

## 2023-02-27 MED ORDER — POLYETHYLENE GLYCOL 3350 17 G PO PACK
17.0000 g | PACK | Freq: Every day | ORAL | Status: DC | PRN
  Administered 2023-02-28: 17 g via ORAL
  Filled 2023-02-27: qty 1

## 2023-02-27 MED ORDER — ONDANSETRON HCL 4 MG PO TABS: 4.0000 mg | ORAL_TABLET | Freq: Four times a day (QID) | ORAL | Status: DC | PRN

## 2023-02-27 MED ORDER — ONDANSETRON HCL 4 MG/2ML IJ SOLN: 4.0000 mg | Freq: Four times a day (QID) | INTRAMUSCULAR | Status: DC | PRN

## 2023-02-27 MED ORDER — ACETAMINOPHEN 650 MG RE SUPP: 650.0000 mg | Freq: Four times a day (QID) | RECTAL | Status: DC | PRN

## 2023-02-27 MED ORDER — MEMANTINE HCL 10 MG PO TABS
10.0000 mg | ORAL_TABLET | Freq: Two times a day (BID) | ORAL | Status: DC
  Administered 2023-02-27 – 2023-03-02 (×6): 10 mg via ORAL
  Filled 2023-02-27 (×6): qty 1

## 2023-02-27 MED ORDER — POLYVINYL ALCOHOL 1.4 % OP SOLN
1.0000 [drp] | Freq: Four times a day (QID) | OPHTHALMIC | Status: DC
  Administered 2023-02-27 – 2023-03-02 (×10): 1 [drp] via OPHTHALMIC
  Filled 2023-02-27: qty 15

## 2023-02-27 MED ORDER — ENOXAPARIN SODIUM 40 MG/0.4ML IJ SOSY
40.0000 mg | PREFILLED_SYRINGE | INTRAMUSCULAR | Status: DC
  Administered 2023-02-27 – 2023-03-01 (×3): 40 mg via SUBCUTANEOUS
  Filled 2023-02-27 (×2): qty 0.4

## 2023-02-27 MED ORDER — TERBINAFINE HCL 1 % EX CREA
TOPICAL_CREAM | Freq: Two times a day (BID) | CUTANEOUS | Status: DC
  Filled 2023-02-27: qty 12

## 2023-02-27 MED ORDER — PANTOPRAZOLE SODIUM 40 MG PO TBEC
40.0000 mg | DELAYED_RELEASE_TABLET | Freq: Every day | ORAL | Status: DC
  Administered 2023-02-28 – 2023-03-02 (×3): 40 mg via ORAL
  Filled 2023-02-27 (×3): qty 1

## 2023-02-27 MED ORDER — ACETAMINOPHEN 325 MG PO TABS: 650.0000 mg | ORAL_TABLET | Freq: Four times a day (QID) | ORAL | Status: DC | PRN
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
## 2023-02-27 NOTE — ED Notes (Signed)
Floor verified it was okay to send patient up. Next shift RN denies questions.
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
## 2023-02-27 NOTE — Sepsis Progress Note (Signed)
Code Sepsis protocol being monitored by eLink. 

## 2023-02-27 NOTE — ED Triage Notes (Signed)
From [facility] via EMS for unwitnessed fall yesterday. Pt c/o left hip pain, multiple abrasions present on face. Upon EMS arrival blood pressure found to be 90s/60s. Distal CMS intact. Per family, patient had a similar episode in June and was diagnosed with UTI.

## 2023-02-27 NOTE — ED Provider Notes (Signed)
[place] [facility]GENCY DEPARTMENT AT [facility] Provider Note   CSN: [id number] Arrival date & time: 02/27/23  1027     History  Chief Complaint  Patient presents with   Hypotension   Fall    90s/50s on ems arrival     Andrew Osborne is a 82 y.o. male.  HPI 82 year old male with a history of dementia presents with altered mental status and fall. History is from daughter at bedside. She saw him yesterday until 6 pm, and he was his normal self. Staff reported to daughter that he was up all night walking around, which is not like him. Early this morning he fell (unwitnessed). Since then has been more sleepy and not acting himself. He acts this way when he was UTIs, most recently last month. When I wake him up, he endorses some neck pain. There was a report of hip pain from EMS but patient denies any pain. Had hypotension with EMS, BP 90/50.   Home Medications Prior to Admission medications   Medication Sig Start Date End Date Taking? Authorizing Provider  Camphor-Eucalyptus-Menthol (GNP CHEST RUB) OINT Apply 1 application  topically at bedtime. To feet and toenails   Yes [provider]  carboxymethylcellulose (ARTIFICIAL TEARS) 1 % ophthalmic solution Place 1 drop into both eyes 4 (four) times daily.   Yes [provider]  cyanocobalamin (VITAMIN B12) 1000 MCG/ML injection Inject 1,000 mcg into the muscle every 14 (fourteen) days.   Yes [provider]  Dextromethorphan-guaiFENesin (CORICIDIN HBP CONGESTION/COUGH PO) Take 1 capsule by mouth every 6 (six) hours as needed (cold symptoms).   Yes [provider]  donepezil (ARICEPT) 10 MG tablet Take 10 mg by mouth at bedtime.   Yes [provider]  erythromycin ophthalmic ointment Place 1 Application into both eyes at bedtime. 10/01/21  Yes [provider]  guaiFENesin (GERI-TUSSIN) 100 MG/5ML liquid Take 200 mg by mouth every 6 (six) hours as needed for cough.   Yes  [provider]  meclizine (ANTIVERT) 12.5 MG tablet Take 12.5 mg by mouth every 8 (eight) hours as needed for dizziness.   Yes [provider]  memantine (NAMENDA) 10 MG tablet TAKE 1 TABLET BY MOUTH TWICE A DAY 03/14/21  Yes Ngetich, Dinah C, NP  Olopatadine HCl 0.2 % SOLN Place 1 drop into both eyes daily.   Yes [provider]  omeprazole (PRILOSEC) 20 MG capsule Take 20 mg by mouth 2 (two) times daily before a meal.   Yes [provider]  Skin Protectants, Misc. (BAZA PROTECT EX) Apply 1 application  topically See admin instructions. Apply topically 3 times daily and as needed   Yes [provider]  tamsulosin (FLOMAX) 0.4 MG CAPS capsule TAKE 1 CAPSULE BY MOUTH EVERYDAY AT BEDTIME 10/22/20  Yes Sharon Seller, NP  traZODone (DESYREL) 50 MG tablet TAKE 1 TABLET BY MOUTH EVERYDAY AT BEDTIME 04/05/21  Yes Sharon Seller, NP      Allergies    Nizoral [ketoconazole]    Review of Systems   Review of Systems  Unable to perform ROS: Dementia    Physical Exam Updated Vital Signs BP 98/64   Pulse 74   Temp 98.4 F (36.9 C) (Axillary)   Resp 17   Ht 5\' 6"  (1.676 m)   Wt 65 kg   SpO2 99%   BMI 23.13 kg/m  Physical Exam Vitals and nursing note reviewed.  Constitutional:      Appearance: He  is well-developed.  HENT:     Head: Normocephalic. Abrasion present.   Neck:     Comments: Midline posterior neck tenderness  Cardiovascular:     Rate and Rhythm: Normal rate and regular rhythm.     Pulses:          Dorsalis pedis pulses are 2+ on the right side and 2+ on the left side.     Heart sounds: Normal heart sounds.  Pulmonary:     Effort: Pulmonary effort is normal.     Breath sounds: Normal breath sounds. No wheezing.  Abdominal:     Palpations: Abdomen is soft.     Tenderness: There is no abdominal tenderness.  Musculoskeletal:     Cervical back: Muscular tenderness present.     Right hip: No deformity or tenderness. Normal  range of motion.     Left hip: No deformity or tenderness. Normal range of motion.  Skin:    General: Skin is warm and dry.     Findings: Rash (nonspecific rash to bilateral feet.) present.  Neurological:     Comments: Patient is asleep. When I wake him up (with voice), he will groggily respond. Is able to answer some questions and tell me he has neck pain. Can weakly but symmetrically move all 4 extremities.      ED Results / Procedures / Treatments   Labs (all labs ordered are listed, but only abnormal results are displayed) Labs Reviewed  CBC WITH DIFFERENTIAL/PLATELET - Abnormal; Notable for the following components:      Result Value   WBC 21.4 (*)    Platelets 106 (*)    Neutro Abs 20.1 (*)    Lymphs Abs 0.2 (*)    Basophils Absolute 0.2 (*)    All other components within normal limits  URINALYSIS, W/ REFLEX TO CULTURE (INFECTION SUSPECTED) - Abnormal; Notable for the following components:   Color, Urine AMBER (*)    APPearance TURBID (*)    Hgb urine dipstick LARGE (*)    Protein, ur 100 (*)    Leukocytes,Ua LARGE (*)    Bacteria, UA MANY (*)    All other components within normal limits  I-STAT CG4 LACTIC ACID, ED - Abnormal; Notable for the following components:   Lactic Acid, Venous 4.2 (*)    All other components within normal limits  I-STAT CHEM 8, ED - Abnormal; Notable for the following components:   BUN 24 (*)    Creatinine, Ser 1.30 (*)    Glucose, Bld 166 (*)    Calcium, Ion 1.09 (*)    TCO2 20 (*)    All other components within normal limits  I-STAT CG4 LACTIC ACID, ED - Abnormal; Notable for the following components:   Lactic Acid, Venous 3.8 (*)    All other components within normal limits  TROPONIN I (HIGH SENSITIVITY) - Abnormal; Notable for the following components:   Troponin I (High Sensitivity) 35 (*)    All other components within normal limits  SARS CORONAVIRUS 2 BY RT PCR  CULTURE, BLOOD (ROUTINE X 2)  CULTURE, BLOOD (ROUTINE X 2)  URINE  CULTURE  PROTIME-INR  APTT  AMMONIA  COMPREHENSIVE METABOLIC PANEL  TROPONIN I (HIGH SENSITIVITY)    ED ECG REPORT   Date: 02/27/2023  Rate: 83  Rhythm: normal sinus rhythm  QRS Axis: normal  Intervals: normal  ST/T Wave abnormalities: normal  Conduction Disutrbances:none  Narrative Interpretation:   Old EKG Reviewed: unchanged  I have personally reviewed the EKG  tracing and agree with the computerized printout as noted.   Radiology CT Maxillofacial Wo Contrast  Result Date: 02/27/2023 CLINICAL DATA:  Trauma, polyp EXAM: CT MAXILLOFACIAL WITHOUT CONTRAST TECHNIQUE: Multidetector CT imaging of the maxillofacial structures was performed. Multiplanar CT image reconstructions were also generated. RADIATION DOSE REDUCTION: This exam was performed according to the departmental dose-optimization program which includes automated exposure control, adjustment of the mA and/or kV according to patient size and/or use of iterative reconstruction technique. COMPARISON:  Previous CT brain done on 01/24/2023 FINDINGS: Osseous: No displaced fractures are seen. Orbits: Optic globes are symmetrical. Retrobulbar soft tissues are unremarkable. Sinuses: There are no air-fluid levels or significant mucosal thickening. Soft tissues: Unremarkable. Limited intracranial: Unremarkable. IMPRESSION: No recent fracture is seen in the facial bones. No focal abnormalities are seen in orbits. There are no air-fluid levels in paranasal sinuses. Electronically Signed   By: Ernie Avena M.D.   On: 02/27/2023 12:54   CT Cervical Spine Wo Contrast  Result Date: 02/27/2023 CLINICAL DATA:  Trauma, fall EXAM: CT CERVICAL SPINE WITHOUT CONTRAST TECHNIQUE: Multidetector CT imaging of the cervical spine was performed without intravenous contrast. Multiplanar CT image reconstructions were also generated. RADIATION DOSE REDUCTION: This exam was performed according to the departmental dose-optimization program which includes  automated exposure control, adjustment of the mA and/or kV according to patient size and/or use of iterative reconstruction technique. COMPARISON:  01/24/2023 FINDINGS: Alignment: There is minimal retrolisthesis at C3-C4 level, possibly residual from previous ligament injury and facet degeneration. This finding has not changed. There is possible minimal anterolisthesis at C5-C6 level. Skull base and vertebrae: No recent fracture is seen. Degenerative changes are noted with disc space narrowing, bony spurs and facet hypertrophy at multiple levels. Soft tissues and spinal canal: Prevertebral soft tissues are unremarkable. There is no central spinal stenosis. Disc levels: There is minimal encroachment of left neural foramen at C2-C3 level. Moderate to marked encroachment of neural foramina is seen from C3 to T1 levels. Upper chest: Scarring is seen in both apices. Other: Secretions are seen in the dependent portion in oropharynx and hypopharynx. IMPRESSION: No recent fracture is seen in cervical spine. Cervical spondylosis with encroachment of neural foramina at multiple levels. No significant interval changes are noted. Electronically Signed   By: Ernie Avena M.D.   On: 02/27/2023 12:51   CT Head Wo Contrast  Result Date: 02/27/2023 CLINICAL DATA:  Trauma, fall EXAM: CT HEAD WITHOUT CONTRAST TECHNIQUE: Contiguous axial images were obtained from the base of the skull through the vertex without intravenous contrast. RADIATION DOSE REDUCTION: This exam was performed according to the departmental dose-optimization program which includes automated exposure control, adjustment of the mA and/or kV according to patient size and/or use of iterative reconstruction technique. COMPARISON:  01/24/2023 FINDINGS: Brain: No acute intracranial findings are seen. There are no signs of bleeding within the cranium. Cortical sulci are prominent. There is decreased density in periventricular white matter. Small old lacunar  infarct is seen in mesial right temporal lobe with no change. Vascular: Unremarkable. Skull: No fracture is seen in the calvarium. There is thin linear lucency in the outer table of the frontoparietal calvarium which has not changed. There is subcutaneous contusion/hematoma in the frontoparietal scalp in midline. Sinuses/Orbits: No acute findings are seen. Other: None. IMPRESSION: No acute intracranial findings are seen. Atrophy. Small vessel disease. There is subcutaneous contusion/hematoma in midline extending more to the left in the frontoparietal scalp. Electronically Signed   By: Harlan Stains.D.  On: 02/27/2023 12:46   DG Pelvis Portable  Result Date: 02/27/2023 CLINICAL DATA:  Status post fall. EXAM: PORTABLE PELVIS 1-2 VIEWS COMPARISON:  Limited correlation made with pelvic CT 10/14/2006. FINDINGS: 1137 hours. The mineralization and alignment are normal. There is no evidence of acute fracture or dislocation. No evidence of femoral head osteonecrosis. The hip and sacroiliac joint spaces are preserved. Mild lower lumbar spondylosis. No focal soft tissue abnormalities are identified. IMPRESSION: No evidence of acute pelvic fracture or hip dislocation. Mild lower lumbar spondylosis. Electronically Signed   By: Carey Bullocks M.D.   On: 02/27/2023 11:59   DG Chest Port 1 View  Result Date: 02/27/2023 CLINICAL DATA:  AMS EXAM: PORTABLE CHEST 1 VIEW COMPARISON:  01/24/2023. FINDINGS: There is a stable subcentimeter sized oval opacity overlying the left mid lung zone, unchanged since prior study dating back to at least 07/28/2018. Bilateral lung fields are otherwise clear. Bilateral costophrenic angles are clear. Normal cardio-mediastinal silhouette. No acute osseous abnormalities. The soft tissues are within normal limits. IMPRESSION: No active disease. Electronically Signed   By: Jules Schick M.D.   On: 02/27/2023 11:43    Procedures .Critical Care  Performed by: Pricilla Loveless,  MD Authorized by: Pricilla Loveless, MD   Critical care provider statement:    Critical care time (minutes):  40   Critical care time was exclusive of:  Separately billable procedures and treating other patients   Critical care was necessary to treat or prevent imminent or life-threatening deterioration of the following conditions:  Sepsis   Critical care was time spent personally by me on the following activities:  Development of treatment plan with patient or surrogate, discussions with consultants, evaluation of patient's response to treatment, examination of patient, ordering and review of laboratory studies, ordering and review of radiographic studies, ordering and performing treatments and interventions, pulse oximetry, re-evaluation of patient's condition and review of old charts     Medications Ordered in ED Medications  cefTRIAXone (ROCEPHIN) 2 g in sodium chloride 0.9 % 100 mL IVPB (0 g Intravenous Stopped 02/27/23 1302)  lactated ringers bolus 1,000 mL (0 mLs Intravenous Stopped 02/27/23 1155)  sodium chloride 0.9 % bolus 1,000 mL (0 mLs Intravenous Stopped 02/27/23 1302)    ED Course/ Medical Decision Making/ A&P Clinical Course as of 02/27/23 1452  Fri Feb 27, 2023  1128 Lactate 4.2. With high concern for UTI, will get I/O cath and call code sepsis. Will give Rocephin. [SG]    Clinical Course User Index [SG] Pricilla Loveless, MD                             Medical Decision Making Amount and/or Complexity of Data Reviewed Labs: ordered.    Details: UTI. Lactate 4. WBC 21 Radiology: ordered and independent interpretation performed.    Details: No head bleed. No PNA. ECG/medicine tests: ordered and independent interpretation performed.    Details: No ischemia  Risk Decision regarding hospitalization.   Patient presents with acute altered mental status. Appears to be from sepsis from a UTI. He was given 30 cc/kg IVF, IV rocephin. CT head unremarkable. No acute fractures.  C-collar removed.  Soft BPs. Had a couple low BPs but corrected. No significant trauma noted. No pelvis/hip fractures. Doubt occult fractures. Discussed with daughter, is DNR but would like treatment. Protecting his airway. Discussed with hospitalist for admission.         Final Clinical Impression(s) / ED Diagnoses Final  diagnoses:  Severe sepsis (HCC)  Acute urinary tract infection    Rx / DC Orders ED Discharge Orders     None         Pricilla Loveless, MD 02/27/23 1501

## 2023-02-28 ENCOUNTER — Inpatient Hospital Stay (HOSPITAL_COMMUNITY): Payer: Medicare Other

## 2023-02-28 DIAGNOSIS — N3000 Acute cystitis without hematuria: Secondary | ICD-10-CM | POA: Diagnosis not present

## 2023-02-28 LAB — BASIC METABOLIC PANEL
Anion gap: 5 (ref 5–15)
BUN: 22 mg/dL (ref 8–23)
CO2: 26 mmol/L (ref 22–32)
Calcium: 8 mg/dL — ABNORMAL LOW (ref 8.9–10.3)
Chloride: 109 mmol/L (ref 98–111)
Creatinine, Ser: 1.18 mg/dL (ref 0.61–1.24)
GFR, Estimated: >60 mL/min (ref >60)
Glucose, Bld: 86 mg/dL (ref 70–99)
Potassium: 4 mmol/L (ref 3.5–5.1)
Sodium: 140 mmol/L (ref 135–145)

## 2023-02-28 LAB — GLUCOSE, CAPILLARY
Glucose-Capillary: 88 mg/dL (ref 70–99)
Glucose-Capillary: 105 mg/dL — ABNORMAL HIGH (ref 70–99)
Glucose-Capillary: 126 mg/dL — ABNORMAL HIGH (ref 70–99)
Glucose-Capillary: 100 mg/dL — ABNORMAL HIGH (ref 70–99)

## 2023-02-28 LAB — CBC
HCT: 39.4 % (ref 39.0–52.0)
Hemoglobin: 13 g/dL (ref 13.0–17.0)
MCH: 32.2 pg (ref 26.0–34.0)
MCHC: 33 g/dL (ref 30.0–36.0)
MCV: 97.5 fL (ref 80.0–100.0)
Platelets: 81 10*3/uL — ABNORMAL LOW (ref 150–400)
RBC: 4.04 MIL/uL — ABNORMAL LOW (ref 4.22–5.81)
RDW: 14.6 % (ref 11.5–15.5)
WBC: 12.9 10*3/uL — ABNORMAL HIGH (ref 4.0–10.5)
nRBC: 0 % (ref 0.0–0.2)

## 2023-02-28 LAB — URINE CULTURE

## 2023-02-28 LAB — PROTIME-INR
INR: 1.4 — ABNORMAL HIGH (ref 0.8–1.2)
Prothrombin Time: 17 seconds — ABNORMAL HIGH (ref 11.4–15.2)

## 2023-02-28 LAB — PROCALCITONIN: Procalcitonin: 50.51 ng/mL

## 2023-02-28 LAB — CULTURE, BLOOD (ROUTINE X 2)

## 2023-02-28 MED ORDER — AMLODIPINE BESYLATE 10 MG PO TABS
10.0000 mg | ORAL_TABLET | Freq: Every day | ORAL | Status: DC
  Administered 2023-02-28 – 2023-03-01 (×2): 10 mg via ORAL
  Filled 2023-02-28: qty 2
  Filled 2023-02-28: qty 1

## 2023-02-28 MED ORDER — LACTATED RINGERS IV SOLN: INTRAVENOUS | Status: DC

## 2023-02-28 MED ORDER — LACTATED RINGERS IV BOLUS: 500.0000 mL | Freq: Once | INTRAVENOUS | Status: DC

## 2023-02-28 MED ORDER — LACTATED RINGERS IV SOLN
INTRAVENOUS | Status: DC
  Administered 2023-02-28: 1000 mL via INTRAVENOUS

## 2023-02-28 MED ORDER — HYDRALAZINE HCL 20 MG/ML IJ SOLN: 10.0000 mg | Freq: Four times a day (QID) | INTRAMUSCULAR | Status: DC | PRN

## 2023-02-28 NOTE — Progress Notes (Signed)
PROGRESS NOTE                                                                                                                                                                                                             Patient Demographics:    Andruw Battie, is a 82 y.o. male, DOB - 1941-06-07, ZOX:[id number]  Outpatient Primary MD for the patient is Smiley Houseman, NP    LOS - 1  Admit date - 02/27/2023    Chief Complaint  Patient presents with   Hypotension   Fall    90s/50s on ems arrival        Brief Narrative (HPI from H&P)   82 y.o. male with medical history significant of  advanced dementia, recurrent UTIs, pernicious anemia, Barret's esophagus, hiatal hernia, reported distal esophageal stricture requiring multiple dilations, Gilbert's syndrome, kidney disease, GERD, dyslipidemia, B12 deficiency, Raynaud's disease, and pre-diabetes. He presents to [facility] ED with altered mental status after a fall at his memory care facility.  Workup was consistent with metabolic encephalopathy, sepsis-UTI, dehydration, hypotension, left frontoparietal scalp hematoma and he was admitted to the hospital for   Subjective:    Chinita Pester today has, No headache, No chest pain, No abdominal pain - No Nausea, No new weakness tingling or numbness, no SOB   Assessment  & Plan :    Sepsis due to UTI with hypotension.  No flank pain or abdominal discomfort, improving with empiric antibiotics and hydration with IV fluids, has quite elevated procalcitonin [place] expecting bacteremia, currently afebrile, will go ahead and check renal ultrasound to rule out any obstruction or hydronephrosis.  Follow final cultures.   Metabolic encephalopathy on top of vascular dementia.  Continue Aricept and Namenda, encephalopathy improving, PT OT and supportive care, at risk for delirium.  Head CT nonacute, no focal deficits.  Mildly elevated  troponin.  No chest pain, nonacute EKG, due to sepsis and demand mismatch.  No further workup.  Thrombocytopenia.  Likely due to sepsis.  On Lovenox, will continue to monitor if drops further may switch to Eliquis at prophylactic dose.  Lab Results  Component Value Date   PLT 81 (L) 02/28/2023    Dysphagia.  Pured diet.  PT OT and speech eval.  Bilateral tenia pedis present on admission.  Lamisil cream.  Condition - Extremely Guarded  Family Communication  : Daughter bedside on 02/28/2023  Code Status : DNR  Consults  : None  PUD Prophylaxis : ppi   Procedures  :     CT head, C-spine and facial bones.  Left frontoparietal scalp hematoma.      Disposition Plan  :    Status is: Observation  DVT Prophylaxis  :    enoxaparin (LOVENOX) injection 40 mg Start: 02/27/23 1600    Lab Results  Component Value Date   PLT 81 (L) 02/28/2023    Diet :  Diet Order             DIET - DYS 1 Room service appropriate? Yes; Fluid consistency: Thin  Diet effective now                    Inpatient Medications  Scheduled Meds:  donepezil  10 mg Oral QHS   enoxaparin (LOVENOX) injection  40 mg Subcutaneous Q24H   erythromycin  1 Application Both Eyes QHS   memantine  10 mg Oral BID   olopatadine  1 drop Both Eyes BID   pantoprazole  40 mg Oral Daily   polyvinyl alcohol  1 drop Both Eyes QID   terbinafine   Topical BID   traZODone  50 mg Oral QHS   Continuous Infusions:  cefTRIAXone (ROCEPHIN)  IV Stopped (02/27/23 1302)   lactated ringers 100 mL/hr at 02/28/23 0435   PRN Meds:.acetaminophen **OR** acetaminophen, ondansetron **OR** ondansetron (ZOFRAN) IV, polyethylene glycol  Antibiotics  :    Anti-infectives (From admission, onward)    Start     Dose/Rate Route Frequency Ordered Stop   02/27/23 1130  cefTRIAXone (ROCEPHIN) 2 g in sodium chloride 0.9 % 100 mL IVPB        2 g 200 mL/hr over 30 Minutes Intravenous Every 24 hours 02/27/23 1128 03/06/23  1129         Objective:   Vitals:   02/27/23 1854 02/28/23 0010 02/28/23 0456 02/28/23 0800  BP: 119/73 (!) 100/59 (!) 92/57 108/65  Pulse: (!) 115 63 (!) 57 66  Resp: 16 17 14 16   Temp: 98.1 F (36.7 C) 97.9 F (36.6 C) 97.8 F (36.6 C) 97.8 F (36.6 C)  TempSrc: Oral Oral  Oral  SpO2: 96% 94% 94% 94%  Weight:      Height:        Wt Readings from Last 3 Encounters:  02/27/23 65 kg  01/24/23 68.5 kg  10/25/21 68.5 kg     Intake/Output Summary (Last 24 hours) at 02/28/2023 1000 Last data filed at 02/28/2023 0435 Gross per 24 hour  Intake 4494.59 ml  Output --  Net 4494.59 ml     Physical Exam  Awake but pleasantly confused no new F.N deficits, Normal affect [place].AT,PERRAL Supple Neck, No JVD,   Symmetrical Chest wall movement, Good air movement bilaterally, CTAB RRR,No Gallops,Rubs or new Murmurs,  +ve B.Sounds, Abd Soft, No tenderness,   No Cyanosis, Clubbing or edema     RN pressure injury documentation: Pressure Injury 02/27/23 Buttocks Stage 1 -  Intact skin with non-blanchable redness of a localized area usually over a bony prominence. nonblanchable redness across sacrum and bottocks. scraches on L buttocks (Active)  02/27/23 1906  Location: Buttocks  Location Orientation:   Staging: Stage 1 -  Intact skin with non-blanchable redness of a localized area usually over a bony prominence.  Wound Description (Comments): nonblanchable redness across sacrum and bottocks.  scraches on L buttocks  Present on Admission: Yes  Dressing Type Foam - Lift dressing to assess site every shift 02/28/23 0758      Data Review:    Recent Labs  Lab 02/27/23 1056 02/27/23 1118 02/28/23 0333  WBC 21.4*  --  12.9*  HGB 14.4 13.9 13.0  HCT 42.7 41.0 39.4  PLT 106*  --  81*  MCV 96.0  --  97.5  MCH 32.4  --  32.2  MCHC 33.7  --  33.0  RDW 14.6  --  14.6  LYMPHSABS 0.2*  --   --   MONOABS 0.9  --   --   EOSABS 0.0  --   --   BASOSABS 0.2*  --   --     Recent  Labs  Lab 02/27/23 1056 02/27/23 1118 02/27/23 1314 02/27/23 1615 02/28/23 0333  NA 137 140  --   --  140  K 3.7 3.7  --   --  4.0  CL 104 105  --   --  109  CO2 20*  --   --   --  26  ANIONGAP 13  --   --   --  5  GLUCOSE 159* 166*  --   --  86  BUN 25* 24*  --   --  22  CREATININE 1.43* 1.30*  --   --  1.18  AST 108*  --   --   --   --   ALT 43  --   --   --   --   ALKPHOS 52  --   --   --   --   BILITOT 2.4*  --   --   --   --   ALBUMIN 3.0*  --   --   --   --   PROCALCITON  --   --   --  55.04 50.51  LATICACIDVEN  --  4.2* 3.8* 2.4*  --   INR 1.2  --   --   --  1.4*  AMMONIA 27  --   --   --   --   CALCIUM 8.2*  --   --   --  8.0*      Recent Labs  Lab 02/27/23 1056 02/27/23 1118 02/27/23 1314 02/27/23 1615 02/28/23 0333  PROCALCITON  --   --   --  55.04 50.51  LATICACIDVEN  --  4.2* 3.8* 2.4*  --   INR 1.2  --   --   --  1.4*  AMMONIA 27  --   --   --   --   CALCIUM 8.2*  --   --   --  8.0*   Lab Results  Component Value Date   HGBA1C 5.9 (H) 10/25/2021    Radiology Reports CT Maxillofacial Wo Contrast  Result Date: 02/27/2023 CLINICAL DATA:  Trauma, polyp EXAM: CT MAXILLOFACIAL WITHOUT CONTRAST TECHNIQUE: Multidetector CT imaging of the maxillofacial structures was performed. Multiplanar CT image reconstructions were also generated. RADIATION DOSE REDUCTION: This exam was performed according to the departmental dose-optimization program which includes automated exposure control, adjustment of the mA and/or kV according to patient size and/or use of iterative reconstruction technique. COMPARISON:  Previous CT brain done on 01/24/2023 FINDINGS: Osseous: No displaced fractures are seen. Orbits: Optic globes are symmetrical. Retrobulbar soft tissues are unremarkable. Sinuses: There are no air-fluid levels or significant mucosal thickening. Soft tissues: Unremarkable. Limited intracranial: Unremarkable. IMPRESSION: No recent fracture is seen  in the facial bones. No  focal abnormalities are seen in orbits. There are no air-fluid levels in paranasal sinuses. Electronically Signed   By: Ernie Avena M.D.   On: 02/27/2023 12:54   CT Cervical Spine Wo Contrast  Result Date: 02/27/2023 CLINICAL DATA:  Trauma, fall EXAM: CT CERVICAL SPINE WITHOUT CONTRAST TECHNIQUE: Multidetector CT imaging of the cervical spine was performed without intravenous contrast. Multiplanar CT image reconstructions were also generated. RADIATION DOSE REDUCTION: This exam was performed according to the departmental dose-optimization program which includes automated exposure control, adjustment of the mA and/or kV according to patient size and/or use of iterative reconstruction technique. COMPARISON:  01/24/2023 FINDINGS: Alignment: There is minimal retrolisthesis at C3-C4 level, possibly residual from previous ligament injury and facet degeneration. This finding has not changed. There is possible minimal anterolisthesis at C5-C6 level. Skull base and vertebrae: No recent fracture is seen. Degenerative changes are noted with disc space narrowing, bony spurs and facet hypertrophy at multiple levels. Soft tissues and spinal canal: Prevertebral soft tissues are unremarkable. There is no central spinal stenosis. Disc levels: There is minimal encroachment of left neural foramen at C2-C3 level. Moderate to marked encroachment of neural foramina is seen from C3 to T1 levels. Upper chest: Scarring is seen in both apices. Other: Secretions are seen in the dependent portion in oropharynx and hypopharynx. IMPRESSION: No recent fracture is seen in cervical spine. Cervical spondylosis with encroachment of neural foramina at multiple levels. No significant interval changes are noted. Electronically Signed   By: Ernie Avena M.D.   On: 02/27/2023 12:51   CT Head Wo Contrast  Result Date: 02/27/2023 CLINICAL DATA:  Trauma, fall EXAM: CT HEAD WITHOUT CONTRAST TECHNIQUE: Contiguous axial images were  obtained from the base of the skull through the vertex without intravenous contrast. RADIATION DOSE REDUCTION: This exam was performed according to the departmental dose-optimization program which includes automated exposure control, adjustment of the mA and/or kV according to patient size and/or use of iterative reconstruction technique. COMPARISON:  01/24/2023 FINDINGS: Brain: No acute intracranial findings are seen. There are no signs of bleeding within the cranium. Cortical sulci are prominent. There is decreased density in periventricular white matter. Small old lacunar infarct is seen in mesial right temporal lobe with no change. Vascular: Unremarkable. Skull: No fracture is seen in the calvarium. There is thin linear lucency in the outer table of the frontoparietal calvarium which has not changed. There is subcutaneous contusion/hematoma in the frontoparietal scalp in midline. Sinuses/Orbits: No acute findings are seen. Other: None. IMPRESSION: No acute intracranial findings are seen. Atrophy. Small vessel disease. There is subcutaneous contusion/hematoma in midline extending more to the left in the frontoparietal scalp. Electronically Signed   By: Ernie Avena M.D.   On: 02/27/2023 12:46   DG Pelvis Portable  Result Date: 02/27/2023 CLINICAL DATA:  Status post fall. EXAM: PORTABLE PELVIS 1-2 VIEWS COMPARISON:  Limited correlation made with pelvic CT 10/14/2006. FINDINGS: 1137 hours. The mineralization and alignment are normal. There is no evidence of acute fracture or dislocation. No evidence of femoral head osteonecrosis. The hip and sacroiliac joint spaces are preserved. Mild lower lumbar spondylosis. No focal soft tissue abnormalities are identified. IMPRESSION: No evidence of acute pelvic fracture or hip dislocation. Mild lower lumbar spondylosis. Electronically Signed   By: Carey Bullocks M.D.   On: 02/27/2023 11:59   DG Chest Port 1 View  Result Date: 02/27/2023 CLINICAL DATA:  AMS  EXAM: PORTABLE CHEST 1 VIEW COMPARISON:  01/24/2023. FINDINGS:  There is a stable subcentimeter sized oval opacity overlying the left mid lung zone, unchanged since prior study dating back to at least 07/28/2018. Bilateral lung fields are otherwise clear. Bilateral costophrenic angles are clear. Normal cardio-mediastinal silhouette. No acute osseous abnormalities. The soft tissues are within normal limits. IMPRESSION: No active disease. Electronically Signed   By: Jules Schick M.D.   On: 02/27/2023 11:43      Signature  -   Susa Raring M.D on 02/28/2023 at 10:00 AM   -  To page go to [email or web address]

## 2023-03-01 DIAGNOSIS — N3000 Acute cystitis without hematuria: Secondary | ICD-10-CM | POA: Diagnosis not present

## 2023-03-01 LAB — GLUCOSE, CAPILLARY
Glucose-Capillary: 91 mg/dL (ref 70–99)
Glucose-Capillary: 138 mg/dL — ABNORMAL HIGH (ref 70–99)
Glucose-Capillary: 105 mg/dL — ABNORMAL HIGH (ref 70–99)
Glucose-Capillary: 141 mg/dL — ABNORMAL HIGH (ref 70–99)

## 2023-03-01 LAB — CBC
HCT: 39.3 % (ref 39.0–52.0)
Hemoglobin: 13.2 g/dL (ref 13.0–17.0)
MCH: 32.7 pg (ref 26.0–34.0)
MCHC: 33.6 g/dL (ref 30.0–36.0)
MCV: 97.3 fL (ref 80.0–100.0)
Platelets: 92 10*3/uL — ABNORMAL LOW (ref 150–400)
RBC: 4.04 MIL/uL — ABNORMAL LOW (ref 4.22–5.81)
RDW: 14.4 % (ref 11.5–15.5)
WBC: 7.3 10*3/uL (ref 4.0–10.5)
nRBC: 0 % (ref 0.0–0.2)

## 2023-03-01 LAB — BASIC METABOLIC PANEL
Anion gap: 6 (ref 5–15)
BUN: 19 mg/dL (ref 8–23)
CO2: 27 mmol/L (ref 22–32)
Calcium: 8.1 mg/dL — ABNORMAL LOW (ref 8.9–10.3)
Chloride: 107 mmol/L (ref 98–111)
Creatinine, Ser: 1.27 mg/dL — ABNORMAL HIGH (ref 0.61–1.24)
GFR, Estimated: 56 mL/min — ABNORMAL LOW (ref >60)
Glucose, Bld: 88 mg/dL (ref 70–99)
Potassium: 3.7 mmol/L (ref 3.5–5.1)
Sodium: 140 mmol/L (ref 135–145)

## 2023-03-01 LAB — MAGNESIUM: Magnesium: 1.9 mg/dL (ref 1.7–2.4)

## 2023-03-01 LAB — URINE CULTURE: Culture: >=100000 — AB

## 2023-03-01 LAB — BRAIN NATRIURETIC PEPTIDE: B Natriuretic Peptide: 221.1 pg/mL — ABNORMAL HIGH (ref 0.0–100.0)

## 2023-03-01 LAB — PROCALCITONIN: Procalcitonin: 29.31 ng/mL

## 2023-03-01 LAB — C-REACTIVE PROTEIN: CRP: 7.4 mg/dL — ABNORMAL HIGH (ref <1.0)

## 2023-03-01 MED ORDER — TAMSULOSIN HCL 0.4 MG PO CAPS
0.4000 mg | ORAL_CAPSULE | Freq: Every day | ORAL | Status: DC
  Administered 2023-03-01: 0.4 mg via ORAL
  Filled 2023-03-01: qty 1

## 2023-03-01 NOTE — TOC Initial Note (Signed)
Transition of Care [facility]) - Initial/Assessment Note    Patient Details  Name: Andrew Osborne MRN: [id number] Date of Birth: May 16, 1941  Transition of Care [facility]) CM/SW Contact:    Lawerance Sabal, RN Phone Number: 03/01/2023, 2:32 PM  Clinical Narrative:                  Spoke w patient's granddaughter over the phone. She states that he is a resident at [facility], memory care.   Expected Discharge Plan: Assisted Living [facility])     Patient Goals and CMS Choice            Expected Discharge Plan and Services                                              Prior Living Arrangements/Services                       Activities of Daily Living      Permission Sought/Granted                  Emotional Assessment              Admission diagnosis:  Urinary tract infection [N39.0] Acute urinary tract infection [N39.0] Sepsis (HCC) [A41.9] Severe sepsis (HCC) [A41.9, R65.20] Patient Active Problem List   Diagnosis Date Noted   Urinary tract infection 02/27/2023   Sepsis (HCC) 02/27/2023   Prediabetes 07/21/2019   Tinea corporis 12/09/2018   Esophageal thrush (HCC) 12/09/2018   Hyperglycemia 12/09/2018   Lower extremity edema 12/09/2018   Dementia without behavioral disturbance (HCC) 12/09/2018   Senile purpura (HCC) 04/29/2018   H/O benign neoplasm of bladder 04/29/2018   Basal cell carcinoma (BCC) of right cheek 04/29/2018   Raynaud's disease without gangrene 04/29/2018   Weight loss 04/29/2018   Benign prostatic hyperplasia (BPH) with straining on urination 04/29/2018   Pernicious anemia 04/29/2018   HTN (hypertension) 09/14/2013   B12 deficiency 08/31/2013   Proteins serum plasma low 08/31/2013   Hypogammaglobulinaemia, unspecified 08/31/2013   Thrush 08/23/2013   Rash and nonspecific skin eruption 01/17/2013   Gastroesophageal reflux disease 01/11/2013   Dyslipidemia 01/11/2013   Hematuria 01/11/2013    Weight loss, unintentional 01/11/2013   Cholelithiasis with cholecystitis 09/21/2012   PCP:  Smiley Houseman, NP Pharmacy:   [facility] [address] [place], [place] - [address] [place] [address] Phone: [phone] Fax: [phone]     Social Determinants of Health (SDOH) Social History: SDOH Screenings   Transportation Needs: No Transportation Needs (01/20/2022)   Received from [facility], [facility]  Alcohol Screen: Low Risk  (08/02/2018)  Depression (PHQ2-9): Low Risk  (10/25/2021)  Tobacco Use: Low Risk  (02/27/2023)   SDOH Interventions:     Readmission Risk Interventions     No data to display

## 2023-03-01 NOTE — Plan of Care (Signed)
  Problem: Clinical Measurements: Goal: Diagnostic test results will improve Outcome: Progressing   Problem: Clinical Measurements: Goal: Signs and symptoms of infection will decrease Outcome: Progressing   Problem: Respiratory: Goal: Ability to maintain adequate ventilation will improve Outcome: Progressing   

## 2023-03-01 NOTE — Evaluation (Signed)
Physical Therapy Evaluation Patient Details Name: Andrew Osborne MRN: [id number] DOB: 06-05-1941 Today's Date: 03/01/2023  History of Present Illness  82 y.o. male presents 02/27/23 with altered mental status after a fall at his memory care facility. +UTI, sepsis;  hypotension  PMH significant of  advanced dementia, recurrent UTIs, pernicious anemia, Barret's esophagus, hiatal hernia, reported distal esophageal stricture requiring multiple dilations, Gilbert's syndrome, kidney disease, B12 deficiency, Raynaud's disease, and pre-diabetes.  Clinical Impression   Pt admitted secondary to problem above with deficits below. PTA patient was living in memory care unit and ambulating with and without rollator (for ~1 year). He often forgets to take the rollator with him, but staff and family continue to encourage him to use it.  Pt currently requires min assist to stand and pivot bed to chair. He is easily fatigued with incr lean to his left (daughter reports this occurs when he is fatigued).  Anticipate patient will benefit from PT to address problems listed below.Will continue to follow acutely to maximize functional mobility independence and safety.           Assistance Recommended at Discharge Frequent or constant Supervision/Assistance  If plan is discharge home, recommend the following:  Can travel by private vehicle  A little help with walking and/or transfers;Direct supervision/assist for medications management;Direct supervision/assist for financial management;Assist for transportation        Equipment Recommendations None recommended by PT  Recommendations for Other Services       Functional Status Assessment Patient has had a recent decline in their functional status and demonstrates the ability to make significant improvements in function in a reasonable and predictable amount of time.     Precautions / Restrictions Precautions Precautions: Fall Precaution Comments: dtr reports  he falls when he has a UTI      Mobility  Bed Mobility Overal bed mobility: Needs Assistance Bed Mobility: Supine to Sit     Supine to sit: Min assist, HOB elevated     General bed mobility comments: tactile and verbal cues for moving legs over EOB and come up to sit; assist for scooting out to get feet on the floor    Transfers Overall transfer level: Needs assistance Equipment used: 1 person hand held assist Transfers: Sit to/from Stand, Bed to chair/wheelchair/BSC Sit to Stand: Min assist, +2 safety/equipment   Step pivot transfers: Min assist, +2 safety/equipment       General transfer comment: vc for coming to full upright standing prior to step-pivot; required Lt HHA during transfer and max multimodal cues to step LLE backwards towards chair prior to sitting    Ambulation/Gait                  Stairs            Wheelchair Mobility     Tilt Bed    Modified Rankin (Stroke Patients Only)       Balance Overall balance assessment: Needs assistance Sitting-balance support: Bilateral upper extremity supported, Feet supported Sitting balance-Leahy Scale: Poor Sitting balance - Comments: leans posteriorly or to the left requiring cues to correct   Standing balance support: Single extremity supported Standing balance-Leahy Scale: Poor                               Pertinent Vitals/Pain Pain Assessment Pain Assessment: Faces Faces Pain Scale: No hurt    Home Living Family/patient expects to be discharged to:: Other (Comment) (  Memory care at [facility])                        Prior Function Prior Level of Function : Needs assist             Mobility Comments: Uses Rollator occasionally but often forgets it or "loses" it in the facilty ADLs Comments: Pt requiring assist with toileting secondary to difficulty with clothing management. Requiring Max assist with dressing and Total assist with bathing and grooming.      Hand Dominance   Dominant Hand: Left (ambidextrous- Left for eating and writing)    Extremity/Trunk Assessment   Upper Extremity Assessment Upper Extremity Assessment: Defer to OT evaluation    Lower Extremity Assessment Lower Extremity Assessment: Generalized weakness    Cervical / Trunk Assessment Cervical / Trunk Assessment: Kyphotic  Communication   Communication: HOH  Cognition Arousal/Alertness: Lethargic (sleeping on arrival; more alert as session progressed) Behavior During Therapy: Flat affect Overall Cognitive Status: Impaired/Different from baseline                                 General Comments: Daughter present and reports he is close to his baseline cognition, but not quite there. Oriented to self, DOB. Delayed processing, but also HOH without hearing aids        General Comments General comments (skin integrity, edema, etc.): Daughter present throughout session. Patient with episode of urinary incontinence upon standing and stood for ~2 minutes to complete urinating in urinal. Returned to chair with OT assisting with bathing lower body and changing socks.    Exercises     Assessment/Plan    PT Assessment Patient needs continued PT services  PT Problem List Decreased strength;Decreased activity tolerance;Decreased balance;Decreased mobility;Decreased cognition;Decreased knowledge of use of DME;Decreased safety awareness       PT Treatment Interventions DME instruction;Gait training;Functional mobility training;Therapeutic activities;Therapeutic exercise;Balance training;Cognitive remediation;Patient/family education    PT Goals (Current goals can be found in the Care Plan section)  Acute Rehab PT Goals Patient Stated Goal: pt unable to state; daughter wants pt to continue to ambulate PT Goal Formulation: With family Time For Goal Achievement: 03/15/23 Potential to Achieve Goals: Fair    Frequency Min 3X/week     Co-evaluation  PT/OT/SLP Co-Evaluation/Treatment: Yes Reason for Co-Treatment: Necessary to address cognition/behavior during functional activity;For patient/therapist safety PT goals addressed during session: Mobility/safety with mobility;Balance         AM-PAC PT "6 Clicks" Mobility  Outcome Measure Help needed turning from your back to your side while in a flat bed without using bedrails?: A Little Help needed moving from lying on your back to sitting on the side of a flat bed without using bedrails?: A Little Help needed moving to and from a bed to a chair (including a wheelchair)?: A Little Help needed standing up from a chair using your arms (e.g., wheelchair or bedside chair)?: A Little Help needed to walk in hospital room?: Total Help needed climbing 3-5 steps with a railing? : Total 6 Click Score: 14    End of Session Equipment Utilized During Treatment: Gait belt Activity Tolerance: Patient limited by fatigue Patient left: in chair;with call bell/phone within reach;with chair alarm set;with family/visitor present Nurse Communication: Mobility status;Other (comment) (condom catheter off on arrival) PT Visit Diagnosis: Unsteadiness on feet (R26.81);Repeated falls (R29.6);Difficulty in walking, not elsewhere classified (R26.2);Other symptoms and signs involving the  nervous system (R29.898)    Time: 9147-8295 PT Time Calculation (min) (ACUTE ONLY): 45 min   Charges:   PT Evaluation $PT Eval Low Complexity: 1 Low   PT General Charges $$ ACUTE PT VISIT: 1 Visit          Jerolyn Center, PT Acute Rehabilitation Services  Office [phone]   Zena Amos 03/01/2023, 11:20 AM
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
## 2023-03-01 NOTE — Progress Notes (Signed)
PROGRESS NOTE                                                                                                                                                                                                             Patient Demographics:    Daelin Haste, is a 82 y.o. male, DOB - 23-Mar-1941, UJW:[id number]  Outpatient Primary MD for the patient is Smiley Houseman, NP    LOS - 2  Admit date - 02/27/2023    Chief Complaint  Patient presents with   Hypotension   Fall    90s/50s on ems arrival        Brief Narrative (HPI from H&P)   82 y.o. male with medical history significant of  advanced dementia, recurrent UTIs, pernicious anemia, Barret's esophagus, hiatal hernia, reported distal esophageal stricture requiring multiple dilations, Gilbert's syndrome, kidney disease, GERD, dyslipidemia, B12 deficiency, Raynaud's disease, and pre-diabetes. He presents to [facility] ED with altered mental status after a fall at his memory care facility.  Workup was consistent with metabolic encephalopathy, sepsis-UTI, dehydration, hypotension, left frontoparietal scalp hematoma and he was admitted to the hospital for   Subjective:   Patient in bed, appears comfortable, denies any headache, no fever, no chest pain or pressure, no shortness of breath , no abdominal pain. No new focal weakness.    Assessment  & Plan :    Sepsis due to E. coli pansensitive UTI with hypotension.  No flank pain or abdominal discomfort, improving with empiric antibiotics and hydration with IV fluids, has quite elevated procalcitonin [place] expecting bacteremia, currently afebrile, will go ahead and check renal ultrasound to rule out any obstruction or hydronephrosis.  Urine culture suggest pansensitive E. coli, follow final blood culture results.  Follow final cultures.   Metabolic encephalopathy on top of vascular dementia.  Continue Aricept and Namenda,  encephalopathy improving, PT OT and supportive care, at risk for delirium.  Head CT nonacute, no focal deficits.  Mildly elevated troponin.  No chest pain, nonacute EKG, due to sepsis and demand mismatch.  No further workup.  BPH.  Flomax resumed, has intermittent and chronic history of urinary retention will monitor bladder scans.    Thrombocytopenia.  Likely due to sepsis.  On Lovenox, will continue to monitor if drops further may switch to Eliquis at prophylactic dose.  Lab Results  Component Value Date   PLT 92 (L) 03/01/2023    Dysphagia.  Pured diet.  PT OT and speech eval.  Bilateral tenia pedis present on admission.  Lamisil cream.       Condition - Extremely Guarded  Family Communication  : Daughter bedside on 02/28/2023, 03/01/2023  Code Status : DNR  Consults  : None  PUD Prophylaxis : PPI   Procedures  :     CT head, C-spine and facial bones.  Left frontoparietal scalp hematoma.      Disposition Plan  :    Status is: Observation  DVT Prophylaxis  :    enoxaparin (LOVENOX) injection 40 mg Start: 02/27/23 1600    Lab Results  Component Value Date   PLT 92 (L) 03/01/2023    Diet :  Diet Order             DIET - DYS 1 Room service appropriate? Yes; Fluid consistency: Thin  Diet effective now                    Inpatient Medications  Scheduled Meds:  amLODipine  10 mg Oral Daily   donepezil  10 mg Oral QHS   enoxaparin (LOVENOX) injection  40 mg Subcutaneous Q24H   erythromycin  1 Application Both Eyes QHS   memantine  10 mg Oral BID   olopatadine  1 drop Both Eyes BID   pantoprazole  40 mg Oral Daily   polyvinyl alcohol  1 drop Both Eyes QID   tamsulosin  0.4 mg Oral QHS   terbinafine   Topical BID   traZODone  50 mg Oral QHS   Continuous Infusions:  cefTRIAXone (ROCEPHIN)  IV Stopped (02/28/23 1101)   PRN Meds:.acetaminophen **OR** acetaminophen, hydrALAZINE, ondansetron **OR** ondansetron (ZOFRAN) IV, polyethylene  glycol  Antibiotics  :    Anti-infectives (From admission, onward)    Start     Dose/Rate Route Frequency Ordered Stop   02/27/23 1130  cefTRIAXone (ROCEPHIN) 2 g in sodium chloride 0.9 % 100 mL IVPB        2 g 200 mL/hr over 30 Minutes Intravenous Every 24 hours 02/27/23 1128 03/06/23 1129         Objective:   Vitals:   02/28/23 1653 02/28/23 2042 03/01/23 0135 03/01/23 0819  BP: 132/72 132/85 125/75 110/72  Pulse: 72 69 (!) 58   Resp: 18 15 16 18   Temp: 98.2 F (36.8 C) 97.9 F (36.6 C) 98.1 F (36.7 C) 97.8 F (36.6 C)  TempSrc: Oral Oral Oral Oral  SpO2:  99% 96%   Weight:      Height:        Wt Readings from Last 3 Encounters:  02/27/23 65 kg  01/24/23 68.5 kg  10/25/21 68.5 kg     Intake/Output Summary (Last 24 hours) at 03/01/2023 0944 Last data filed at 03/01/2023 0158 Gross per 24 hour  Intake 334.15 ml  Output 2200 ml  Net -1865.85 ml     Physical Exam  Awake but pleasantly confused no new F.N deficits, Normal affect [place].AT,PERRAL Supple Neck, No JVD,   Symmetrical Chest wall movement, Good air movement bilaterally, CTAB RRR,No Gallops,Rubs or new Murmurs,  +ve B.Sounds, Abd Soft, No tenderness,   No Cyanosis, Clubbing or edema     RN pressure injury documentation:  Pressure Injury 02/27/23 Buttocks Stage 1 -  Intact skin with non-blanchable redness of a localized area usually over a bony prominence. nonblanchable redness across sacrum and  bottocks. scraches on L buttocks (Active)  02/27/23 1906  Location: Buttocks  Location Orientation:   Staging: Stage 1 -  Intact skin with non-blanchable redness of a localized area usually over a bony prominence.  Wound Description (Comments): nonblanchable redness across sacrum and bottocks. scraches on L buttocks  Present on Admission: Yes  Dressing Type Foam - Lift dressing to assess site every shift 02/28/23 0758      Data Review:    Recent Labs  Lab 02/27/23 1056 02/27/23 1118 02/28/23 0333  03/01/23 0554  WBC 21.4*  --  12.9* 7.3  HGB 14.4 13.9 13.0 13.2  HCT 42.7 41.0 39.4 39.3  PLT 106*  --  81* 92*  MCV 96.0  --  97.5 97.3  MCH 32.4  --  32.2 32.7  MCHC 33.7  --  33.0 33.6  RDW 14.6  --  14.6 14.4  LYMPHSABS 0.2*  --   --   --   MONOABS 0.9  --   --   --   EOSABS 0.0  --   --   --   BASOSABS 0.2*  --   --   --     Recent Labs  Lab 02/27/23 1056 02/27/23 1118 02/27/23 1314 02/27/23 1615 02/28/23 0333 03/01/23 0554  NA 137 140  --   --  140 140  K 3.7 3.7  --   --  4.0 3.7  CL 104 105  --   --  109 107  CO2 20*  --   --   --  26 27  ANIONGAP 13  --   --   --  5 6  GLUCOSE 159* 166*  --   --  86 88  BUN 25* 24*  --   --  22 19  CREATININE 1.43* 1.30*  --   --  1.18 1.27*  AST 108*  --   --   --   --   --   ALT 43  --   --   --   --   --   ALKPHOS 52  --   --   --   --   --   BILITOT 2.4*  --   --   --   --   --   ALBUMIN 3.0*  --   --   --   --   --   CRP  --   --   --   --   --  7.4*  PROCALCITON  --   --   --  55.04 50.51 29.31  LATICACIDVEN  --  4.2* 3.8* 2.4*  --   --   INR 1.2  --   --   --  1.4*  --   AMMONIA 27  --   --   --   --   --   BNP  --   --   --   --   --  221.1*  MG  --   --   --   --   --  1.9  CALCIUM 8.2*  --   --   --  8.0* 8.1*      Recent Labs  Lab 02/27/23 1056 02/27/23 1118 02/27/23 1314 02/27/23 1615 02/28/23 0333 03/01/23 0554  CRP  --   --   --   --   --  7.4*  PROCALCITON  --   --   --  55.04 50.51 29.31  LATICACIDVEN  --  4.2* 3.8* 2.4*  --   --  INR 1.2  --   --   --  1.4*  --   AMMONIA 27  --   --   --   --   --   BNP  --   --   --   --   --  221.1*  MG  --   --   --   --   --  1.9  CALCIUM 8.2*  --   --   --  8.0* 8.1*   Lab Results  Component Value Date   HGBA1C 5.9 (H) 10/25/2021    Radiology Reports US RENAL  Result Date: 02/28/2023 CLINICAL DATA:  Urinary tract infection. EXAM: RENAL / URINARY TRACT ULTRASOUND COMPLETE COMPARISON:  Images from outside renal ultrasound 11/27/2021.  Abdominopelvic CT 11/04/2012. FINDINGS: Right Kidney: Renal measurements: 10.4 x 4.6 x 4.7 cm = volume: 116.4 mL. Simple cyst in the upper pole measures 4.6 x 5.3 x 4.1 cm, similar to previous studies. No evidence of hydronephrosis. Left Kidney: Renal measurements: 9.8 x 4.3 x 4.4 cm = volume: 96.0 mL. Simple appearing cyst in the lower pole measures 3.1 x 3.4 x 3.4 cm, similar to previous ultrasound. No evidence of hydronephrosis. Bladder: Trabeculated bladder with right-sided diverticula. There is dependent echogenic debris within the bladder and one of the diverticula. Other: None. IMPRESSION: 1. No evidence of hydronephrosis. 2. Bilateral renal cysts. 3. Trabeculated bladder with right-sided diverticula and dependent echogenic debris. Electronically Signed   By: Carey Bullocks M.D.   On: 02/28/2023 15:00   CT Maxillofacial Wo Contrast  Result Date: 02/27/2023 CLINICAL DATA:  Trauma, polyp EXAM: CT MAXILLOFACIAL WITHOUT CONTRAST TECHNIQUE: Multidetector CT imaging of the maxillofacial structures was performed. Multiplanar CT image reconstructions were also generated. RADIATION DOSE REDUCTION: This exam was performed according to the departmental dose-optimization program which includes automated exposure control, adjustment of the mA and/or kV according to patient size and/or use of iterative reconstruction technique. COMPARISON:  Previous CT brain done on 01/24/2023 FINDINGS: Osseous: No displaced fractures are seen. Orbits: Optic globes are symmetrical. Retrobulbar soft tissues are unremarkable. Sinuses: There are no air-fluid levels or significant mucosal thickening. Soft tissues: Unremarkable. Limited intracranial: Unremarkable. IMPRESSION: No recent fracture is seen in the facial bones. No focal abnormalities are seen in orbits. There are no air-fluid levels in paranasal sinuses. Electronically Signed   By: Ernie Avena M.D.   On: 02/27/2023 12:54   CT Cervical Spine Wo Contrast  Result Date:  02/27/2023 CLINICAL DATA:  Trauma, fall EXAM: CT CERVICAL SPINE WITHOUT CONTRAST TECHNIQUE: Multidetector CT imaging of the cervical spine was performed without intravenous contrast. Multiplanar CT image reconstructions were also generated. RADIATION DOSE REDUCTION: This exam was performed according to the departmental dose-optimization program which includes automated exposure control, adjustment of the mA and/or kV according to patient size and/or use of iterative reconstruction technique. COMPARISON:  01/24/2023 FINDINGS: Alignment: There is minimal retrolisthesis at C3-C4 level, possibly residual from previous ligament injury and facet degeneration. This finding has not changed. There is possible minimal anterolisthesis at C5-C6 level. Skull base and vertebrae: No recent fracture is seen. Degenerative changes are noted with disc space narrowing, bony spurs and facet hypertrophy at multiple levels. Soft tissues and spinal canal: Prevertebral soft tissues are unremarkable. There is no central spinal stenosis. Disc levels: There is minimal encroachment of left neural foramen at C2-C3 level. Moderate to marked encroachment of neural foramina is seen from C3 to T1 levels. Upper chest: Scarring is seen in both apices. Other: Secretions  are seen in the dependent portion in oropharynx and hypopharynx. IMPRESSION: No recent fracture is seen in cervical spine. Cervical spondylosis with encroachment of neural foramina at multiple levels. No significant interval changes are noted. Electronically Signed   By: Ernie Avena M.D.   On: 02/27/2023 12:51   CT Head Wo Contrast  Result Date: 02/27/2023 CLINICAL DATA:  Trauma, fall EXAM: CT HEAD WITHOUT CONTRAST TECHNIQUE: Contiguous axial images were obtained from the base of the skull through the vertex without intravenous contrast. RADIATION DOSE REDUCTION: This exam was performed according to the departmental dose-optimization program which includes automated exposure  control, adjustment of the mA and/or kV according to patient size and/or use of iterative reconstruction technique. COMPARISON:  01/24/2023 FINDINGS: Brain: No acute intracranial findings are seen. There are no signs of bleeding within the cranium. Cortical sulci are prominent. There is decreased density in periventricular white matter. Small old lacunar infarct is seen in mesial right temporal lobe with no change. Vascular: Unremarkable. Skull: No fracture is seen in the calvarium. There is thin linear lucency in the outer table of the frontoparietal calvarium which has not changed. There is subcutaneous contusion/hematoma in the frontoparietal scalp in midline. Sinuses/Orbits: No acute findings are seen. Other: None. IMPRESSION: No acute intracranial findings are seen. Atrophy. Small vessel disease. There is subcutaneous contusion/hematoma in midline extending more to the left in the frontoparietal scalp. Electronically Signed   By: Ernie Avena M.D.   On: 02/27/2023 12:46   DG Pelvis Portable  Result Date: 02/27/2023 CLINICAL DATA:  Status post fall. EXAM: PORTABLE PELVIS 1-2 VIEWS COMPARISON:  Limited correlation made with pelvic CT 10/14/2006. FINDINGS: 1137 hours. The mineralization and alignment are normal. There is no evidence of acute fracture or dislocation. No evidence of femoral head osteonecrosis. The hip and sacroiliac joint spaces are preserved. Mild lower lumbar spondylosis. No focal soft tissue abnormalities are identified. IMPRESSION: No evidence of acute pelvic fracture or hip dislocation. Mild lower lumbar spondylosis. Electronically Signed   By: Carey Bullocks M.D.   On: 02/27/2023 11:59   DG Chest Port 1 View  Result Date: 02/27/2023 CLINICAL DATA:  AMS EXAM: PORTABLE CHEST 1 VIEW COMPARISON:  01/24/2023. FINDINGS: There is a stable subcentimeter sized oval opacity overlying the left mid lung zone, unchanged since prior study dating back to at least 07/28/2018. Bilateral lung  fields are otherwise clear. Bilateral costophrenic angles are clear. Normal cardio-mediastinal silhouette. No acute osseous abnormalities. The soft tissues are within normal limits. IMPRESSION: No active disease. Electronically Signed   By: Jules Schick M.D.   On: 02/27/2023 11:43      Signature  -   Susa Raring M.D on 03/01/2023 at 9:44 AM   -  To page go to [email or web address]

## 2023-03-01 NOTE — Evaluation (Signed)
Occupational Therapy Evaluation Patient Details Name: Andrew Osborne MRN: [id number] DOB: 18-Feb-1941 Today's Date: 03/01/2023   History of Present Illness 82 y.o. male presents 02/27/23 with altered mental status after a fall at his memory care facility. +UTI, sepsis;  hypotension  PMH significant of  advanced dementia, recurrent UTIs, pernicious anemia, Barret's esophagus, hiatal hernia, reported distal esophageal stricture requiring multiple dilations, Gilbert's syndrome, kidney disease, B12 deficiency, Raynaud's disease, and pre-diabetes.   Clinical Impression   At baseline, pt is a resident of the memory care unit at [facility] and completes ADLs with assist and functional transfers/mobility with and without a Rollator with Mod I. At baseline, pt receives assistance from staff for all IADLs. Pt also has good support from family. Pt now presents with decreased activity tolerance, decreased sitting and standing balance during functional tasks, generalized B UE weakness, and decreased safety and independence with ADLs and functional transfers/mobility Pt currently demonstrates ability to complete UB ADLs with Set up to Min-Mod assist assist, LB ADLs with Min+2 to Mod +2 assist, and functional transfers with hand hold assist with Min +2 assist for safety. Pt will benefit from acute skilled OT services to address deficits outlined below and to increase safety and independence with ADLs, functional transfers, and functional mobility. Post acute discharge, pt will benefit from continued skilled OT services at [facility] to maximize rehab potential.      Recommendations for follow up therapy are one component of a multi-disciplinary discharge planning process, led by the attending physician.  Recommendations may be updated based on patient status, additional functional criteria and insurance authorization.   Assistance Recommended at Discharge Frequent or constant Supervision/Assistance  Patient can  return home with the following A lot of help with bathing/dressing/bathroom;A lot of help with walking and/or transfers;Assistance with cooking/housework;Assistance with feeding;Direct supervision/assist for medications management;Direct supervision/assist for financial management;Assist for transportation;Help with stairs or ramp for entrance (Set up for self feeding)    Functional Status Assessment  Patient has had a recent decline in their functional status and demonstrates the ability to make significant improvements in function in a reasonable and predictable amount of time.  Equipment Recommendations  Other (comment) (Defer to next level of care)    Recommendations for Other Services       Precautions / Restrictions Precautions Precautions: Fall Precaution Comments: dtr reports he falls when he has a UTI Restrictions Weight Bearing Restrictions: No      Mobility Bed Mobility Overal bed mobility: Needs Assistance Bed Mobility: Supine to Sit     Supine to sit: Min assist, HOB elevated     General bed mobility comments: tactile and verbal cues for moving legs over EOB and come up to sit; assist for scooting out to get feet on the floor    Transfers Overall transfer level: Needs assistance Equipment used: 1 person hand held assist Transfers: Sit to/from Stand, Bed to chair/wheelchair/BSC Sit to Stand: Min assist, +2 safety/equipment     Step pivot transfers: Min assist, +2 safety/equipment     General transfer comment: vc for coming to full upright standing prior to step-pivot; required Lt HHA during transfer and max multimodal cues to step LLE backwards towards chair prior to sitting      Balance Overall balance assessment: Needs assistance Sitting-balance support: Bilateral upper extremity supported, Feet supported Sitting balance-Leahy Scale: Poor Sitting balance - Comments: leans posteriorly or to the left requiring cues to correct   Standing balance support:  Single extremity supported Standing balance-Leahy Scale: Poor  ADL either performed or assessed with clinical judgement   ADL Overall ADL's : Needs assistance/impaired Eating/Feeding: Set up;Sitting Eating/Feeding Details (indicate cue type and reason): sitting with back and left side support Grooming: Min guard;Sitting Grooming Details (indicate cue type and reason): sitting with back and left side support Upper Body Bathing: Minimal assistance;Moderate assistance;Sitting;Cueing for safety;Cueing for sequencing Upper Body Bathing Details (indicate cue type and reason): sitting with back and left side support and with increased time Lower Body Bathing: Moderate assistance;Cueing for safety;Cueing for sequencing;Sit to/from stand;+2 for safety/equipment Lower Body Bathing Details (indicate cue type and reason): sitting with back and left side support and with increased time Upper Body Dressing : Minimal assistance;Sitting;Cueing for sequencing Upper Body Dressing Details (indicate cue type and reason): sitting with back and left side support and with increased time Lower Body Dressing: Minimal assistance;Moderate assistance;+2 for safety/equipment;Cueing for safety;Cueing for sequencing;Sit to/from stand Lower Body Dressing Details (indicate cue type and reason): sitting with back and left side support and with increased time Toilet Transfer: Minimal assistance;+2 for safety/equipment;BSC/3in1 (Step-pivot with hand held assist)   Toileting- Clothing Manipulation and Hygiene: Moderate assistance;+2 for safety/equipment;Sit to/from stand;Cueing for safety;Cueing for sequencing Toileting - Clothing Manipulation Details (indicate cue type and reason): sitting with back and left side support and with increased time             Vision Patient Visual Report: Other (comment) (Used to wear reading glasses, has not worn them recently. Pt's daughter report pt  frequently misplaced glasses. Daughter also reports pt loves to read and does not seem to have any issues with this without glasses.)       Perception     Praxis      Pertinent Vitals/Pain Pain Assessment Pain Assessment: Faces Faces Pain Scale: No hurt Pain Intervention(s): Monitored during session     Hand Dominance Left (ambidextrous- Left for eating and writing)   Extremity/Trunk Assessment Upper Extremity Assessment Upper Extremity Assessment: Generalized weakness   Lower Extremity Assessment Lower Extremity Assessment: Defer to PT evaluation   Cervical / Trunk Assessment Cervical / Trunk Assessment: Kyphotic   Communication Communication Communication: HOH   Cognition Arousal/Alertness: Lethargic (Sleeping upon arrival. Pt with increased alertness once sitting EOB.) Behavior During Therapy: Flat affect Overall Cognitive Status: Impaired/Different from baseline                                 General Comments: Daughter present and reports he is close to his baseline cognition, but not quite there. Oriented to self, DOB. Delayed processing, but also HOH without hearing aids     General Comments  Daughter present throughout session. Patient with episode of urinary incontinence upon standing and stood for ~2 minutes to complete urinating in urinal. Returned to chair with OT assisting with bathing lower body and changing socks. Pt's daughter reports pt is generally more alert and able to participate in the afternoon (between about 11 am to 6 pm).    Exercises     Shoulder Instructions      Home Living Family/patient expects to be discharged to:: Other (Comment) (Memory care at [facility])                                        Prior Functioning/Environment Prior Level of Function : Needs assist  Mobility Comments: Uses Rollator occasionally but often forgets it or "loses" it in the facilty ADLs Comments: Pt  requiring assist with toileting secondary to difficulty with clothing management. Requiring Max assist with dressing and Total assist with bathing and grooming.        OT Problem List: Decreased strength;Decreased activity tolerance;Impaired balance (sitting and/or standing);Decreased cognition      OT Treatment/Interventions: Self-care/ADL training;Therapeutic exercise;DME and/or AE instruction;Therapeutic activities;Cognitive remediation/compensation;Patient/family education;Balance training    OT Goals(Current goals can be found in the care plan section) Acute Rehab OT Goals Patient Stated Goal: Pt did not state OT Goal Formulation: With patient/family Time For Goal Achievement: 03/15/23 Potential to Achieve Goals: Good ADL Goals Pt Will Perform Grooming: with supervision;sitting (sitting EOB for 5 minute or more with Fair balance) Pt Will Perform Upper Body Bathing: with min guard assist;sitting Pt Will Perform Lower Body Bathing: with min assist;sit to/from stand Pt Will Perform Lower Body Dressing: with min guard assist;sit to/from stand Pt Will Transfer to Toilet: with min guard assist;ambulating;bedside commode (with least restrictive AD) Pt Will Perform Toileting - Clothing Manipulation and hygiene: with min guard assist;sit to/from stand  OT Frequency: Min 1X/week    Co-evaluation PT/OT/SLP Co-Evaluation/Treatment: Yes Reason for Co-Treatment: Necessary to address cognition/behavior during functional activity;For patient/therapist safety PT goals addressed during session: Mobility/safety with mobility;Balance OT goals addressed during session: ADL's and self-care      AM-PAC OT "6 Clicks" Daily Activity     Outcome Measure Help from another person eating meals?: A Little Help from another person taking care of personal grooming?: A Little Help from another person toileting, which includes using toliet, bedpan, or urinal?: A Lot Help from another person bathing (including  washing, rinsing, drying)?: A Lot Help from another person to put on and taking off regular upper body clothing?: A Lot Help from another person to put on and taking off regular lower body clothing?: A Little 6 Click Score: 15   End of Session Equipment Utilized During Treatment: Gait belt Nurse Communication: Mobility status;Other (comment) (Pt urinated during session. Pt's condom catheter has come off in bed prior to OT/PT arrival.)  Activity Tolerance: Patient tolerated treatment well Patient left: in chair;with call bell/phone within reach;with chair alarm set;with family/visitor present  OT Visit Diagnosis: Unsteadiness on feet (R26.81);Other abnormalities of gait and mobility (R26.89);Muscle weakness (generalized) (M62.81);History of falling (Z91.81);Other symptoms and signs involving cognitive function;Other (comment) (Decreased activity tolerance)                Time: 1191-4782 OT Time Calculation (min): 45 min Charges:  OT General Charges $OT Visit: 1 Visit OT Evaluation $OT Eval Low Complexity: 1 Low OT Treatments $Self Care/Home Management : 8-22 mins  Kenry Daubert "Orson Eva., OTR/L, MA Acute Rehab [phone]   Lendon Colonel 03/01/2023, 12:39 PM

## 2023-03-02 ENCOUNTER — Other Ambulatory Visit (HOSPITAL_COMMUNITY): Payer: Self-pay

## 2023-03-02 DIAGNOSIS — N3000 Acute cystitis without hematuria: Secondary | ICD-10-CM | POA: Diagnosis not present

## 2023-03-02 LAB — BASIC METABOLIC PANEL
Anion gap: 6 (ref 5–15)
BUN: 18 mg/dL (ref 8–23)
CO2: 27 mmol/L (ref 22–32)
Calcium: 8 mg/dL — ABNORMAL LOW (ref 8.9–10.3)
Chloride: 105 mmol/L (ref 98–111)
Creatinine, Ser: 1.07 mg/dL (ref 0.61–1.24)
GFR, Estimated: >60 mL/min (ref >60)
Glucose, Bld: 99 mg/dL (ref 70–99)
Potassium: 3.6 mmol/L (ref 3.5–5.1)
Sodium: 138 mmol/L (ref 135–145)

## 2023-03-02 LAB — MAGNESIUM: Magnesium: 1.9 mg/dL (ref 1.7–2.4)

## 2023-03-02 LAB — CBC WITH DIFFERENTIAL/PLATELET
Abs Immature Granulocytes: 0.04 10*3/uL (ref 0.00–0.07)
Basophils Absolute: 0 10*3/uL (ref 0.0–0.1)
Basophils Relative: 1 %
Eosinophils Absolute: 0 10*3/uL (ref 0.0–0.5)
Eosinophils Relative: 0 %
HCT: 38.9 % — ABNORMAL LOW (ref 39.0–52.0)
Hemoglobin: 13.3 g/dL (ref 13.0–17.0)
Immature Granulocytes: 1 %
Lymphocytes Relative: 24 %
Lymphs Abs: 1.2 10*3/uL (ref 0.7–4.0)
MCH: 33.3 pg (ref 26.0–34.0)
MCHC: 34.2 g/dL (ref 30.0–36.0)
MCV: 97.5 fL (ref 80.0–100.0)
Monocytes Absolute: 0.4 10*3/uL (ref 0.1–1.0)
Monocytes Relative: 8 %
Neutro Abs: 3.4 10*3/uL (ref 1.7–7.7)
Neutrophils Relative %: 66 %
Platelets: 94 10*3/uL — ABNORMAL LOW (ref 150–400)
RBC: 3.99 MIL/uL — ABNORMAL LOW (ref 4.22–5.81)
RDW: 14.1 % (ref 11.5–15.5)
WBC: 5.1 10*3/uL (ref 4.0–10.5)
nRBC: 0 % (ref 0.0–0.2)

## 2023-03-02 LAB — BRAIN NATRIURETIC PEPTIDE: B Natriuretic Peptide: 141.5 pg/mL — ABNORMAL HIGH (ref 0.0–100.0)

## 2023-03-02 LAB — C-REACTIVE PROTEIN: CRP: 3.4 mg/dL — ABNORMAL HIGH (ref <1.0)

## 2023-03-02 LAB — GLUCOSE, CAPILLARY: Glucose-Capillary: 102 mg/dL — ABNORMAL HIGH (ref 70–99)

## 2023-03-02 LAB — PROCALCITONIN: Procalcitonin: 16.19 ng/mL

## 2023-03-02 LAB — CULTURE, BLOOD (ROUTINE X 2)

## 2023-03-02 MED ORDER — TERBINAFINE HCL 1 % EX CREA
TOPICAL_CREAM | Freq: Two times a day (BID) | CUTANEOUS | 0 refills | Status: DC
Start: 2023-03-02 — End: 2023-03-02
  Filled 2023-03-02: qty 30, 30d supply, fill #0

## 2023-03-02 MED ORDER — TERBINAFINE HCL 1 % EX CREA: TOPICAL_CREAM | Freq: Two times a day (BID) | CUTANEOUS | 0 refills | Status: DC

## 2023-03-02 MED ORDER — CEPHALEXIN 500 MG PO CAPS
500.0000 mg | ORAL_CAPSULE | Freq: Three times a day (TID) | ORAL | 0 refills | Status: DC
  Filled 2023-03-02: qty 15, 5d supply, fill #0

## 2023-03-02 MED ORDER — CEPHALEXIN 500 MG PO CAPS: 500.0000 mg | ORAL_CAPSULE | Freq: Three times a day (TID) | ORAL | 0 refills | Status: AC

## 2023-03-02 MED ORDER — SODIUM CHLORIDE 0.9 % IV SOLN
2.0000 g | INTRAVENOUS | Status: DC
  Administered 2023-03-02: 2 g via INTRAVENOUS
  Filled 2023-03-02: qty 20

## 2023-03-02 NOTE — TOC Transition Note (Addendum)
Transition of Care [facility]) - CM/SW Discharge Note   Patient Details  Name: Andrew Osborne MRN: [id number] Date of Birth: [birth year]  Transition of Care [facility]) CM/SW Contact:  Ronny Bacon, RN Phone Number: 03/02/2023, 10:25 AM   Clinical Narrative:   Patient being discharged to [facility] ALF with [facility] PT arranged through [facility]. Daughter to transport patient to [facility] at discharge. Daughter reports that patient has RW at [facility].  Final next level of care: Assisted Living (with home health) Barriers to Discharge: No Barriers Identified   Patient Goals and CMS Choice      Discharge Placement                         Discharge Plan and Services Additional resources added to the After Visit Summary for                  DME Arranged: Walker rolling DME Agency: [employer] Date DME Agency Contacted: 03/02/23 Time DME Agency Contacted: 1021 Representative spoke with at DME Agency: Vonna Kotyk HH Arranged: PT HH Agency: [facility] Date [facility] Agency Contacted: 03/02/23 Time HH Agency Contacted: 1022 Representative spoke with at [facility] Agency: Kandee Keen  Social Determinants of Health (SDOH) Interventions SDOH Screenings   Transportation Needs: No Transportation Needs (01/20/2022)   Received from [facility], [facility]  Alcohol Screen: Low Risk  (08/02/2018)  Depression (PHQ2-9): Low Risk  (10/25/2021)  Tobacco Use: Low Risk  (02/27/2023)     Readmission Risk Interventions     No data to display

## 2023-03-02 NOTE — Plan of Care (Signed)
  Problem: Fluid Volume: Goal: Hemodynamic stability will improve Outcome: Completed/Met   Problem: Clinical Measurements: Goal: Diagnostic test results will improve Outcome: Completed/Met Goal: Signs and symptoms of infection will decrease Outcome: Completed/Met   Problem: Respiratory: Goal: Ability to maintain adequate ventilation will improve Outcome: Completed/Met   Problem: Education: Goal: Knowledge of General Education information will improve Description: Including pain rating scale, medication(s)/side effects and non-pharmacologic comfort measures Outcome: Completed/Met   Problem: Health Behavior/Discharge Planning: Goal: Ability to manage health-related needs will improve Outcome: Completed/Met   Problem: Clinical Measurements: Goal: Ability to maintain clinical measurements within normal limits will improve Outcome: Completed/Met Goal: Will remain free from infection Outcome: Completed/Met Goal: Diagnostic test results will improve Outcome: Completed/Met Goal: Respiratory complications will improve Outcome: Completed/Met Goal: Cardiovascular complication will be avoided Outcome: Completed/Met   Problem: Activity: Goal: Risk for activity intolerance will decrease Outcome: Completed/Met   Problem: Nutrition: Goal: Adequate nutrition will be maintained Outcome: Completed/Met   Problem: Coping: Goal: Level of anxiety will decrease Outcome: Completed/Met   Problem: Elimination: Goal: Will not experience complications related to bowel motility Outcome: Completed/Met Goal: Will not experience complications related to urinary retention Outcome: Completed/Met   Problem: Pain Managment: Goal: General experience of comfort will improve Outcome: Completed/Met   Problem: Safety: Goal: Ability to remain free from injury will improve Outcome: Completed/Met   Problem: Skin Integrity: Goal: Risk for impaired skin integrity will decrease Outcome:  Completed/Met

## 2023-03-02 NOTE — Care Management Important Message (Signed)
Important Message  Patient Details  Name: Andrew Osborne MRN: [id number] Date of Birth: Jul 26, 1941   Medicare Important Message Given:  Yes  Patient left prior to IM delivery will mail copy of IM to the patient home address.    Sabrina Keough 03/02/2023, 4:36 PM

## 2023-03-02 NOTE — Discharge Instructions (Addendum)
Follow with Primary MD Smiley Houseman, NP in 7 days, get your scalp laceration site checked.  Get CBC, CMP, 2 view Chest X ray -  checked next visit with your primary MD    Activity: As tolerated with Full fall precautions use walker/cane & assistance as needed  Disposition Memory care unit for dementia, follow all fall precautions, use bed alarm at night and if needed in the day to prevent falls.  Diet: Dysphagia 1 diet with feeding assistance and aspiration precautions.  Special Instructions: If you have smoked or chewed Tobacco  in the last 2 yrs please stop smoking, stop any regular Alcohol  and or any Recreational drug use.  On your next visit with your primary care physician please Get Medicines reviewed and adjusted.  Please request your Prim.MD to go over all Hospital Tests and Procedure/Radiological results at the follow up, please get all Hospital records sent to your Prim MD by signing hospital release before you go home.  If you experience worsening of your admission symptoms, develop shortness of breath, life threatening emergency, suicidal or homicidal thoughts you must seek medical attention immediately by calling 911 or calling your MD immediately  if symptoms less severe.  You Must read complete instructions/literature along with all the possible adverse reactions/side effects for all the Medicines you take and that have been prescribed to you. Take any new Medicines after you have completely understood and accpet all the possible adverse reactions/side effects.

## 2023-03-02 NOTE — Discharge Summary (Signed)
Andrew Osborne MVH:[id number] DOB: [birth year] DOA: 02/27/2023  PCP: Smiley Houseman, NP  Admit date: 02/27/2023  Discharge date: 03/02/2023  Admitted From: Memory care unit     Disposition: Memory care unit   Recommendations for Outpatient Follow-up:   Follow up with PCP in 1-2 weeks  PCP Please obtain BMP/CBC, 2 view CXR in 1week,  (see Discharge instructions)   PCP Please follow up on the following pending results: Needs outpatient follow-up with dermatology for his tenia pedis infection and outpatient urology follow-up.   Home Health: PT Equipment/Devices: walker Consultations: None  Discharge Condition: Fair   CODE STATUS: DNR   Diet Recommendation: Dysphagia 1 diet with feeding assistance and aspiration precautions.   Chief Complaint  Patient presents with   Hypotension   Fall    90s/50s on ems arrival      Brief history of present illness from the day of admission and additional interim summary    82 y.o. male with medical history significant of  advanced dementia, recurrent UTIs, pernicious anemia, Barret's esophagus, hiatal hernia, reported distal esophageal stricture requiring multiple dilations, Gilbert's syndrome, kidney disease, GERD, dyslipidemia, B12 deficiency, Raynaud's disease, and pre-diabetes. He presents to [facility] ED with altered mental status after a fall at his memory care facility.  Workup was consistent with metabolic encephalopathy, sepsis-UTI, dehydration, hypotension, left frontoparietal scalp hematoma and he was admitted to the hospital                                                                   Hospital Course   Sepsis due to E. coli pansensitive UTI with hypotension.  No flank pain or abdominal discomfort, improving with empiric antibiotics and hydration with IV  fluids, has quite elevated procalcitonin [place] expecting bacteremia, currently afebrile, abscess pathophysiology has resolved renal ultrasound nonacute, received 4 days of IV Rocephin now will be transition to 5 more days of oral Keflex and discharged with outpatient follow-up with PCP and urology.   Metabolic encephalopathy on top of vascular dementia.  Continue Aricept and Namenda, encephalopathy improving, PT OT and supportive care, at risk for delirium.  Head CT nonacute, no focal deficits.  He had incurred a fall prior to hospital visit at his memory care unit will request the memory care staff to use fall precautions, bed alarms and monitor him closely.  Has some superficial soft tissue injuries to the frontal parietal scalp, continue to monitor.   Mildly elevated troponin.  No chest pain, nonacute EKG, due to sepsis and demand mismatch.  No further workup.   BPH.  Flomax resumed, has intermittent and chronic history of urinary retention will monitor bladder scans.  Also noted to have some bladder trabeculations, will have him follow-up with urology in 7 to 10 days, PCP to arrange.  Thrombocytopenia.  Likely due to sepsis.  Stable no acute issues counts improving. Lab Results  Component Value Date   PLT 94 (L) 03/02/2023    Dysphagia.  Pured diet.  PT OT and speech eval. continue dysphagia 1 diet upon discharge.   Bilateral tenia pedis present on admission.  Lamisil cream.  Keep socks off at night, follow-up with dermatology in 7 to 10 days.   Discharge diagnosis     Principal Problem:   Urinary tract infection Active Problems:   Sepsis [facility])    Discharge instructions    Discharge Instructions     Discharge instructions   Complete by: As directed    Follow with Primary MD Smiley Houseman, NP in 7 days, get your scalp laceration site checked.  Get CBC, CMP, 2 view Chest X ray -  checked next visit with your primary MD    Activity: As tolerated with Full fall precautions  use walker/cane & assistance as needed  Disposition Memory care unit for dementia, follow all fall precautions, use bed alarm at night and if needed in the day to prevent falls.  Diet: Dysphagia 1 diet with feeding assistance and aspiration precautions.  Special Instructions: If you have smoked or chewed Tobacco  in the last 2 yrs please stop smoking, stop any regular Alcohol  and or any Recreational drug use.  On your next visit with your primary care physician please Get Medicines reviewed and adjusted.  Please request your Prim.MD to go over all Hospital Tests and Procedure/Radiological results at the follow up, please get all Hospital records sent to your Prim MD by signing hospital release before you go home.  If you experience worsening of your admission symptoms, develop shortness of breath, life threatening emergency, suicidal or homicidal thoughts you must seek medical attention immediately by calling 911 or calling your MD immediately  if symptoms less severe.  You Must read complete instructions/literature along with all the possible adverse reactions/side effects for all the Medicines you take and that have been prescribed to you. Take any new Medicines after you have completely understood and accpet all the possible adverse reactions/side effects.   Discharge wound care:   Complete by: As directed    Keep feet without socks in the nighttime   Increase activity slowly   Complete by: As directed        Discharge Medications   Allergies as of 03/02/2023       Reactions   Nizoral [ketoconazole] Other (See Comments)   "Made fluid come through skin"  (was a combination of this drug w/ another drug pt can't remember)        Medication List     TAKE these medications    Artificial Tears 1 % ophthalmic solution Generic drug: carboxymethylcellulose Place 1 drop into both eyes 4 (four) times daily.   BAZA PROTECT EX Apply 1 application  topically See admin instructions.  Apply topically 3 times daily and as needed   cephALEXin 500 MG capsule Commonly known as: KEFLEX Take 1 capsule (500 mg total) by mouth 3 (three) times daily for 5 days.   CORICIDIN HBP CONGESTION/COUGH PO Take 1 capsule by mouth every 6 (six) hours as needed (cold symptoms).   cyanocobalamin 1000 MCG/ML injection Commonly known as: VITAMIN B12 Inject 1,000 mcg into the muscle every 14 (fourteen) days.   donepezil 10 MG tablet Commonly known as: ARICEPT Take 10 mg by mouth at bedtime.   erythromycin ophthalmic ointment Place 1 Application into  both eyes at bedtime.   Geri-Tussin 100 MG/5ML liquid Generic drug: guaiFENesin Take 200 mg by mouth every 6 (six) hours as needed for cough.   GNP Chest Rub Oint Apply 1 application  topically at bedtime. To feet and toenails   meclizine 12.5 MG tablet Commonly known as: ANTIVERT Take 12.5 mg by mouth every 8 (eight) hours as needed for dizziness.   memantine 10 MG tablet Commonly known as: NAMENDA TAKE 1 TABLET BY MOUTH TWICE A DAY   Olopatadine HCl 0.2 % Soln Place 1 drop into both eyes daily.   omeprazole 20 MG capsule Commonly known as: PRILOSEC Take 20 mg by mouth 2 (two) times daily before a meal.   tamsulosin 0.4 MG Caps capsule Commonly known as: FLOMAX TAKE 1 CAPSULE BY MOUTH EVERYDAY AT BEDTIME   terbinafine 1 % cream Commonly known as: LAMISIL Apply topically 2 (two) times daily. Apply to both feet twice a day, leave socks off in the nighttime.   traZODone 50 MG tablet Commonly known as: DESYREL TAKE 1 TABLET BY MOUTH EVERYDAY AT BEDTIME               Durable Medical Equipment  (From admission, onward)           Start     Ordered   03/02/23 1018  For home use only DME Walker rolling  Once       Comments: 5 wheel  Question Answer Comment  Walker: With 5 Inch Wheels   Patient needs a walker to treat with the following condition Weakness      03/02/23 1017              Discharge Care  Instructions  (From admission, onward)           Start     Ordered   03/02/23 0000  Discharge wound care:       Comments: Keep feet without socks in the nighttime   03/02/23 1015             Follow-up Information     Smiley Houseman, NP. Schedule an appointment as soon as possible for a visit in 1 week(s).   Specialty: Family Medicine Why: And your urologist in 7 to 10 days. Contact information: [address] [place] [address] [phone]         Loletta Parish., MD. Schedule an appointment as soon as possible for a visit in 1 week(s).   Specialty: Urology Contact information: [address] [place] [address] [phone]                 Major procedures and Radiology Reports - PLEASE review detailed and final reports thoroughly  -      US RENAL  Result Date: 02/28/2023 CLINICAL DATA:  Urinary tract infection. EXAM: RENAL / URINARY TRACT ULTRASOUND COMPLETE COMPARISON:  Images from outside renal ultrasound 11/27/2021. Abdominopelvic CT 11/04/2012. FINDINGS: Right Kidney: Renal measurements: 10.4 x 4.6 x 4.7 cm = volume: 116.4 mL. Simple cyst in the upper pole measures 4.6 x 5.3 x 4.1 cm, similar to previous studies. No evidence of hydronephrosis. Left Kidney: Renal measurements: 9.8 x 4.3 x 4.4 cm = volume: 96.0 mL. Simple appearing cyst in the lower pole measures 3.1 x 3.4 x 3.4 cm, similar to previous ultrasound. No evidence of hydronephrosis. Bladder: Trabeculated bladder with right-sided diverticula. There is dependent echogenic debris within the bladder and one of the diverticula. Other: None. IMPRESSION: 1. No evidence  of hydronephrosis. 2. Bilateral renal cysts. 3. Trabeculated bladder with right-sided diverticula and dependent echogenic debris. Electronically Signed   By: Carey Bullocks M.D.   On: 02/28/2023 15:00   CT Maxillofacial Wo Contrast  Result Date: 02/27/2023 CLINICAL DATA:  Trauma, polyp EXAM: CT MAXILLOFACIAL WITHOUT  CONTRAST TECHNIQUE: Multidetector CT imaging of the maxillofacial structures was performed. Multiplanar CT image reconstructions were also generated. RADIATION DOSE REDUCTION: This exam was performed according to the departmental dose-optimization program which includes automated exposure control, adjustment of the mA and/or kV according to patient size and/or use of iterative reconstruction technique. COMPARISON:  Previous CT brain done on 01/24/2023 FINDINGS: Osseous: No displaced fractures are seen. Orbits: Optic globes are symmetrical. Retrobulbar soft tissues are unremarkable. Sinuses: There are no air-fluid levels or significant mucosal thickening. Soft tissues: Unremarkable. Limited intracranial: Unremarkable. IMPRESSION: No recent fracture is seen in the facial bones. No focal abnormalities are seen in orbits. There are no air-fluid levels in paranasal sinuses. Electronically Signed   By: Ernie Avena M.D.   On: 02/27/2023 12:54   CT Cervical Spine Wo Contrast  Result Date: 02/27/2023 CLINICAL DATA:  Trauma, fall EXAM: CT CERVICAL SPINE WITHOUT CONTRAST TECHNIQUE: Multidetector CT imaging of the cervical spine was performed without intravenous contrast. Multiplanar CT image reconstructions were also generated. RADIATION DOSE REDUCTION: This exam was performed according to the departmental dose-optimization program which includes automated exposure control, adjustment of the mA and/or kV according to patient size and/or use of iterative reconstruction technique. COMPARISON:  01/24/2023 FINDINGS: Alignment: There is minimal retrolisthesis at C3-C4 level, possibly residual from previous ligament injury and facet degeneration. This finding has not changed. There is possible minimal anterolisthesis at C5-C6 level. Skull base and vertebrae: No recent fracture is seen. Degenerative changes are noted with disc space narrowing, bony spurs and facet hypertrophy at multiple levels. Soft tissues and spinal  canal: Prevertebral soft tissues are unremarkable. There is no central spinal stenosis. Disc levels: There is minimal encroachment of left neural foramen at C2-C3 level. Moderate to marked encroachment of neural foramina is seen from C3 to T1 levels. Upper chest: Scarring is seen in both apices. Other: Secretions are seen in the dependent portion in oropharynx and hypopharynx. IMPRESSION: No recent fracture is seen in cervical spine. Cervical spondylosis with encroachment of neural foramina at multiple levels. No significant interval changes are noted. Electronically Signed   By: Ernie Avena M.D.   On: 02/27/2023 12:51   CT Head Wo Contrast  Result Date: 02/27/2023 CLINICAL DATA:  Trauma, fall EXAM: CT HEAD WITHOUT CONTRAST TECHNIQUE: Contiguous axial images were obtained from the base of the skull through the vertex without intravenous contrast. RADIATION DOSE REDUCTION: This exam was performed according to the departmental dose-optimization program which includes automated exposure control, adjustment of the mA and/or kV according to patient size and/or use of iterative reconstruction technique. COMPARISON:  01/24/2023 FINDINGS: Brain: No acute intracranial findings are seen. There are no signs of bleeding within the cranium. Cortical sulci are prominent. There is decreased density in periventricular white matter. Small old lacunar infarct is seen in mesial right temporal lobe with no change. Vascular: Unremarkable. Skull: No fracture is seen in the calvarium. There is thin linear lucency in the outer table of the frontoparietal calvarium which has not changed. There is subcutaneous contusion/hematoma in the frontoparietal scalp in midline. Sinuses/Orbits: No acute findings are seen. Other: None. IMPRESSION: No acute intracranial findings are seen. Atrophy. Small vessel disease. There is subcutaneous contusion/hematoma in midline  extending more to the left in the frontoparietal scalp. Electronically  Signed   By: Ernie Avena M.D.   On: 02/27/2023 12:46   DG Pelvis Portable  Result Date: 02/27/2023 CLINICAL DATA:  Status post fall. EXAM: PORTABLE PELVIS 1-2 VIEWS COMPARISON:  Limited correlation made with pelvic CT 10/14/2006. FINDINGS: 1137 hours. The mineralization and alignment are normal. There is no evidence of acute fracture or dislocation. No evidence of femoral head osteonecrosis. The hip and sacroiliac joint spaces are preserved. Mild lower lumbar spondylosis. No focal soft tissue abnormalities are identified. IMPRESSION: No evidence of acute pelvic fracture or hip dislocation. Mild lower lumbar spondylosis. Electronically Signed   By: Carey Bullocks M.D.   On: 02/27/2023 11:59   DG Chest Port 1 View  Result Date: 02/27/2023 CLINICAL DATA:  AMS EXAM: PORTABLE CHEST 1 VIEW COMPARISON:  01/24/2023. FINDINGS: There is a stable subcentimeter sized oval opacity overlying the left mid lung zone, unchanged since prior study dating back to at least 07/28/2018. Bilateral lung fields are otherwise clear. Bilateral costophrenic angles are clear. Normal cardio-mediastinal silhouette. No acute osseous abnormalities. The soft tissues are within normal limits. IMPRESSION: No active disease. Electronically Signed   By: Jules Schick M.D.   On: 02/27/2023 11:43    Today   Subjective    Andrew Osborne today has no headache,no chest abdominal pain,no new weakness tingling or numbness, feels much better    Objective   Blood pressure 113/73, pulse 60, temperature 98.3 F (36.8 C), temperature source Oral, resp. rate 17, height 5\' 6"  (1.676 m), weight 65 kg, SpO2 96%.   Intake/Output Summary (Last 24 hours) at 03/02/2023 1019 Last data filed at 03/02/2023 0500 Gross per 24 hour  Intake 100 ml  Output 2025 ml  Net -1925 ml    Exam  Awake , mildly confused, No new F.N deficits,    [place].AT,PERRAL Supple Neck,   Symmetrical Chest wall movement, Good air movement bilaterally, CTAB RRR,No  Gallops,   +ve B.Sounds, Abd Soft, Non tender,  No Cyanosis, Clubbing or edema    Data Review   Recent Labs  Lab 02/27/23 1056 02/27/23 1118 02/28/23 0333 03/01/23 0554 03/02/23 0215  WBC 21.4*  --  12.9* 7.3 5.1  HGB 14.4 13.9 13.0 13.2 13.3  HCT 42.7 41.0 39.4 39.3 38.9*  PLT 106*  --  81* 92* 94*  MCV 96.0  --  97.5 97.3 97.5  MCH 32.4  --  32.2 32.7 33.3  MCHC 33.7  --  33.0 33.6 34.2  RDW 14.6  --  14.6 14.4 14.1  LYMPHSABS 0.2*  --   --   --  1.2  MONOABS 0.9  --   --   --  0.4  EOSABS 0.0  --   --   --  0.0  BASOSABS 0.2*  --   --   --  0.0    Recent Labs  Lab 02/27/23 1056 02/27/23 1118 02/27/23 1314 02/27/23 1615 02/28/23 0333 03/01/23 0554 03/02/23 0215  NA 137 140  --   --  140 140 138  K 3.7 3.7  --   --  4.0 3.7 3.6  CL 104 105  --   --  109 107 105  CO2 20*  --   --   --  26 27 27   ANIONGAP 13  --   --   --  5 6 6   GLUCOSE 159* 166*  --   --  86 88 99  BUN 25*  24*  --   --  22 19 18   CREATININE 1.43* 1.30*  --   --  1.18 1.27* 1.07  AST 108*  --   --   --   --   --   --   ALT 43  --   --   --   --   --   --   ALKPHOS 52  --   --   --   --   --   --   BILITOT 2.4*  --   --   --   --   --   --   ALBUMIN 3.0*  --   --   --   --   --   --   CRP  --   --   --   --   --  7.4* 3.4*  PROCALCITON  --   --   --  55.04 50.51 29.31 16.19  LATICACIDVEN  --  4.2* 3.8* 2.4*  --   --   --   INR 1.2  --   --   --  1.4*  --   --   AMMONIA 27  --   --   --   --   --   --   BNP  --   --   --   --   --  221.1* 141.5*  MG  --   --   --   --   --  1.9 1.9  CALCIUM 8.2*  --   --   --  8.0* 8.1* 8.0*    Total Time in preparing paper work, data evaluation and todays exam - 35 minutes  Signature  -    Susa Raring M.D on 03/02/2023 at 10:19 AM   -  To page go to [email or web address]

## 2023-03-02 NOTE — NC FL2 (Addendum)
[place] MEDICAID FL2 LEVEL OF CARE FORM     IDENTIFICATION  Patient Name: Andrew Osborne Birthdate: 1941/06/08 Sex: male Admission Date (Current Location): 02/27/2023  [facility] and [place] Number:  Producer, television/film/video and Address:  [facility] [place], [address] [place], [place] [address]      Provider Number: [id number]  Attending Physician Name and Address:  Leroy Sea, MD  Relative Name and Phone Number:       Current Level of Care: Hospital Recommended Level of Care: Memory Care Prior Approval Number:    Date Approved/Denied:   PASRR Number:    Discharge Plan: Other (Comment) (Memory Care)    Current Diagnoses: Patient Active Problem List   Diagnosis Date Noted   Urinary tract infection 02/27/2023   Sepsis (HCC) 02/27/2023   Prediabetes 07/21/2019   Tinea corporis 12/09/2018   Esophageal thrush (HCC) 12/09/2018   Hyperglycemia 12/09/2018   Lower extremity edema 12/09/2018   Dementia without behavioral disturbance (HCC) 12/09/2018   Senile purpura (HCC) 04/29/2018   H/O benign neoplasm of bladder 04/29/2018   Basal cell carcinoma (BCC) of right cheek 04/29/2018   Raynaud's disease without gangrene 04/29/2018   Weight loss 04/29/2018   Benign prostatic hyperplasia (BPH) with straining on urination 04/29/2018   Pernicious anemia 04/29/2018   HTN (hypertension) 09/14/2013   B12 deficiency 08/31/2013   Proteins serum plasma low 08/31/2013   Hypogammaglobulinaemia, unspecified 08/31/2013   Thrush 08/23/2013   Rash and nonspecific skin eruption 01/17/2013   Gastroesophageal reflux disease 01/11/2013   Dyslipidemia 01/11/2013   Hematuria 01/11/2013   Weight loss, unintentional 01/11/2013   Cholelithiasis with cholecystitis 09/21/2012    Orientation RESPIRATION BLADDER Height & Weight     Self  Normal Incontinent Weight: 143 lb 4.8 oz (65 kg) Height:  5\' 6"  (167.6 cm)  BEHAVIORAL SYMPTOMS/MOOD NEUROLOGICAL BOWEL NUTRITION  STATUS      Incontinent Diet-Pureed   AMBULATORY STATUS COMMUNICATION OF NEEDS Skin   Limited Assist Verbally PU Stage and Appropriate Care (Stage I on sacrum)                       Personal Care Assistance Level of Assistance  Bathing, Feeding, Dressing Bathing Assistance: Limited assistance Feeding assistance: Limited assistance Dressing Assistance: Limited assistance     Functional Limitations Info             SPECIAL CARE FACTORS FREQUENCY  PT (By licensed PT)     PT Frequency: Home Health PT              Contractures Contractures Info: Not present    Additional Factors Info  Code Status, Allergies Code Status Info: DNR Allergies Info: Nizoral           Current Medications (03/02/2023):   Discharge Medications:  TAKE these medications     Artificial Tears 1 % ophthalmic solution Generic drug: carboxymethylcellulose Place 1 drop into both eyes 4 (four) times daily.    BAZA PROTECT EX Apply 1 application  topically See admin instructions. Apply topically 3 times daily and as needed    cephALEXin 500 MG capsule Commonly known as: KEFLEX Take 1 capsule (500 mg total) by mouth 3 (three) times daily for 5 days.    CORICIDIN HBP CONGESTION/COUGH PO Take 1 capsule by mouth every 6 (six) hours as needed (cold symptoms).    cyanocobalamin 1000 MCG/ML injection Commonly known as: VITAMIN B12 Inject 1,000 mcg into the muscle  every 14 (fourteen) days.    donepezil 10 MG tablet Commonly known as: ARICEPT Take 10 mg by mouth at bedtime.    erythromycin ophthalmic ointment Place 1 Application into both eyes at bedtime.    Geri-Tussin 100 MG/5ML liquid Generic drug: guaiFENesin Take 200 mg by mouth every 6 (six) hours as needed for cough.    GNP Chest Rub Oint Apply 1 application  topically at bedtime. To feet and toenails    meclizine 12.5 MG tablet Commonly known as: ANTIVERT Take 12.5 mg by mouth every 8 (eight) hours as needed for  dizziness.    memantine 10 MG tablet Commonly known as: NAMENDA TAKE 1 TABLET BY MOUTH TWICE A DAY    Olopatadine HCl 0.2 % Soln Place 1 drop into both eyes daily.    omeprazole 20 MG capsule Commonly known as: PRILOSEC Take 20 mg by mouth 2 (two) times daily before a meal.    tamsulosin 0.4 MG Caps capsule Commonly known as: FLOMAX TAKE 1 CAPSULE BY MOUTH EVERYDAY AT BEDTIME    terbinafine 1 % cream Commonly known as: LAMISIL Apply topically 2 (two) times daily. Apply to both feet twice a day, leave socks off in the nighttime.    traZODone 50 MG tablet Commonly known as: DESYREL TAKE 1 TABLET BY MOUTH EVERYDAY AT BEDTIME   Relevant Imaging Results:  Relevant Lab Results:   Additional Information SSN: [id number] [place], [place]

## 2023-03-02 NOTE — TOC Transition Note (Signed)
Transition of Care [facility]) - CM/SW Discharge Note   Patient Details  Name: Andrew Osborne MRN: [id number] Date of Birth: [birth year]  [facility] CM/SW Contact:  Mearl Latin, LCSW Phone Number: 03/02/2023, 11:41 AM   Clinical Narrative:    CSW confirmed pureed diet and faxed DC Summary and FL2 to med tech at [facility] (f. [phone]). Facility will alert [facility] to work with patient. CSW placed printed script in patient's dc packet. Family to transport. No other needs identified at this time.    Final next level of care: Assisted Living (with home health) Barriers to Discharge: No Barriers Identified   Patient Goals and CMS Choice CMS Medicare.gov Compare Post Acute Care list provided to:: Patient Represenative (must comment) Choice offered to / list presented to : Adult Children  Discharge Placement                  Patient to be transferred to facility by: Daughter   Patient and family notified of of transfer: 03/02/23  Discharge Plan and Services Additional resources added to the After Visit Summary for                  DME Arranged: Walker rolling DME Agency: [employer] Date DME Agency Contacted: 03/02/23 Time DME Agency Contacted: 1021 Representative spoke with at DME Agency: Vonna Kotyk HH Arranged: PT HH Agency: [facility] Date [facility] Agency Contacted: 03/02/23 Time HH Agency Contacted: 1022 Representative spoke with at [facility] Agency: Kandee Keen  Social Determinants of Health (SDOH) Interventions SDOH Screenings   Transportation Needs: No Transportation Needs (01/20/2022)   [facility] from [facility], [facility]  Alcohol Screen: Low Risk  (08/02/2018)  Depression (PHQ2-9): Low Risk  (10/25/2021)  Tobacco Use: Low Risk  (02/27/2023)     Readmission Risk Interventions     No data to display

## 2023-03-03 LAB — CULTURE, BLOOD (ROUTINE X 2)

## 2023-03-04 LAB — CULTURE, BLOOD (ROUTINE X 2)
Culture: NO GROWTH
Culture: NO GROWTH
Special Requests: ADEQUATE

## 2023-07-02 ENCOUNTER — Ambulatory Visit (INDEPENDENT_AMBULATORY_CARE_PROVIDER_SITE_OTHER): Payer: Medicare Other | Admitting: Podiatry

## 2023-07-02 ENCOUNTER — Encounter: Payer: Self-pay | Admitting: Podiatry

## 2023-07-02 VITALS — Ht 66.0 in | Wt 143.3 lb

## 2023-07-02 DIAGNOSIS — M79675 Pain in left toe(s): Secondary | ICD-10-CM | POA: Diagnosis not present

## 2023-07-02 DIAGNOSIS — B351 Tinea unguium: Secondary | ICD-10-CM

## 2023-07-02 DIAGNOSIS — M79674 Pain in right toe(s): Secondary | ICD-10-CM

## 2023-07-02 NOTE — Progress Notes (Signed)
This patient returns to my office for at risk foot care.  This patient requires this care by a professional since this patient will be at risk due to having dementia.  Patient presents to the office with his son.  This patient is unable to cut nails himself since the patient cannot reach his nails.These nails are painful walking and wearing shoes.  This patient presents for at risk foot care today.  General Appearance  Alert, conversant and in no acute stress.  Vascular  Dorsalis pedis and posterior tibial  pulses are palpable  bilaterally.  Capillary return is within normal limits  bilaterally. Temperature is within normal limits  bilaterally.  Neurologic  Senn-Weinstein monofilament wire test within normal limits  bilaterally. Muscle power within normal limits bilaterally.  Nails Thick disfigured discolored nails with subungual debris  from hallux to fifth toes bilaterally. No evidence of bacterial infection or drainage bilaterally.  Orthopedic  No limitations of motion  feet .  No crepitus or effusions noted.  No bony pathology or digital deformities noted.  Skin  normotropic skin with no porokeratosis noted bilaterally.  No signs of infections or ulcers noted.     Onychomycosis  Pain in right toes  Pain in left toes  Consent was obtained for treatment procedures.   Mechanical debridement of nails 1-5  bilaterally performed with a nail nipper.  Filed with dremel without incident.    Return office visit    3 months                  Told patient to return for periodic foot care and evaluation due to potential at risk complications.   Helane Gunther DPM

## 2023-09-05 ENCOUNTER — Emergency Department (HOSPITAL_COMMUNITY): Payer: Medicare Other

## 2023-09-05 ENCOUNTER — Encounter (HOSPITAL_COMMUNITY): Payer: Self-pay

## 2023-09-05 ENCOUNTER — Inpatient Hospital Stay (HOSPITAL_COMMUNITY)
Admission: EM | Admit: 2023-09-05 | Discharge: 2023-09-09 | DRG: 871 | Disposition: A | Discharge status: Hospice | Payer: Medicare Other | Source: Skilled Nursing Facility | Attending: Family Medicine | Admitting: Family Medicine

## 2023-09-05 ENCOUNTER — Other Ambulatory Visit: Payer: Self-pay

## 2023-09-05 DIAGNOSIS — R0902 Hypoxemia: Secondary | ICD-10-CM

## 2023-09-05 DIAGNOSIS — Z888 Allergy status to other drugs, medicaments and biological substances status: Secondary | ICD-10-CM

## 2023-09-05 DIAGNOSIS — T17908A Unspecified foreign body in respiratory tract, part unspecified causing other injury, initial encounter: Principal | ICD-10-CM

## 2023-09-05 DIAGNOSIS — Z85828 Personal history of other malignant neoplasm of skin: Secondary | ICD-10-CM

## 2023-09-05 DIAGNOSIS — J690 Pneumonitis due to inhalation of food and vomit: Secondary | ICD-10-CM | POA: Diagnosis present

## 2023-09-05 DIAGNOSIS — Z1152 Encounter for screening for COVID-19: Secondary | ICD-10-CM

## 2023-09-05 DIAGNOSIS — R4182 Altered mental status, unspecified: Secondary | ICD-10-CM | POA: Insufficient documentation

## 2023-09-05 DIAGNOSIS — Z820 Family history of epilepsy and other diseases of the nervous system: Secondary | ICD-10-CM

## 2023-09-05 DIAGNOSIS — Z66 Do not resuscitate: Secondary | ICD-10-CM | POA: Diagnosis present

## 2023-09-05 DIAGNOSIS — Z9049 Acquired absence of other specified parts of digestive tract: Secondary | ICD-10-CM

## 2023-09-05 DIAGNOSIS — J9601 Acute respiratory failure with hypoxia: Secondary | ICD-10-CM | POA: Diagnosis present

## 2023-09-05 DIAGNOSIS — Z79899 Other long term (current) drug therapy: Secondary | ICD-10-CM

## 2023-09-05 DIAGNOSIS — J189 Pneumonia, unspecified organism: Secondary | ICD-10-CM | POA: Diagnosis present

## 2023-09-05 DIAGNOSIS — R1319 Other dysphagia: Secondary | ICD-10-CM | POA: Diagnosis present

## 2023-09-05 DIAGNOSIS — Z87440 Personal history of urinary (tract) infections: Secondary | ICD-10-CM

## 2023-09-05 DIAGNOSIS — H9190 Unspecified hearing loss, unspecified ear: Secondary | ICD-10-CM | POA: Diagnosis present

## 2023-09-05 DIAGNOSIS — Z86008 Personal history of in-situ neoplasm of other site: Secondary | ICD-10-CM

## 2023-09-05 DIAGNOSIS — K219 Gastro-esophageal reflux disease without esophagitis: Secondary | ICD-10-CM | POA: Diagnosis present

## 2023-09-05 DIAGNOSIS — N400 Enlarged prostate without lower urinary tract symptoms: Secondary | ICD-10-CM | POA: Diagnosis present

## 2023-09-05 DIAGNOSIS — Z961 Presence of intraocular lens: Secondary | ICD-10-CM | POA: Diagnosis present

## 2023-09-05 DIAGNOSIS — E785 Hyperlipidemia, unspecified: Secondary | ICD-10-CM | POA: Diagnosis present

## 2023-09-05 DIAGNOSIS — G4700 Insomnia, unspecified: Secondary | ICD-10-CM | POA: Diagnosis present

## 2023-09-05 DIAGNOSIS — L89151 Pressure ulcer of sacral region, stage 1: Secondary | ICD-10-CM | POA: Diagnosis present

## 2023-09-05 DIAGNOSIS — Z833 Family history of diabetes mellitus: Secondary | ICD-10-CM

## 2023-09-05 DIAGNOSIS — K222 Esophageal obstruction: Secondary | ICD-10-CM | POA: Diagnosis present

## 2023-09-05 DIAGNOSIS — A419 Sepsis, unspecified organism: Secondary | ICD-10-CM | POA: Diagnosis not present

## 2023-09-05 DIAGNOSIS — K2270 Barrett's esophagus without dysplasia: Secondary | ICD-10-CM | POA: Diagnosis present

## 2023-09-05 DIAGNOSIS — F03918 Unspecified dementia, unspecified severity, with other behavioral disturbance: Secondary | ICD-10-CM | POA: Diagnosis present

## 2023-09-05 DIAGNOSIS — W19XXXA Unspecified fall, initial encounter: Secondary | ICD-10-CM | POA: Diagnosis present

## 2023-09-05 LAB — URINALYSIS, ROUTINE W REFLEX MICROSCOPIC
Bilirubin Urine: NEGATIVE
Glucose, UA: NEGATIVE mg/dL
Ketones, ur: NEGATIVE mg/dL
Nitrite: NEGATIVE
Protein, ur: 30 mg/dL — AB
Specific Gravity, Urine: 1.013 (ref 1.005–1.030)
WBC, UA: >50 WBC/hpf (ref 0–5)
pH: 6 (ref 5.0–8.0)

## 2023-09-05 LAB — RESPIRATORY PANEL BY PCR

## 2023-09-05 LAB — CBC WITH DIFFERENTIAL/PLATELET
Abs Immature Granulocytes: 0.03 10*3/uL (ref 0.00–0.07)
Basophils Absolute: 0 10*3/uL (ref 0.0–0.1)
Basophils Relative: 1 %
Eosinophils Absolute: 0 10*3/uL (ref 0.0–0.5)
Eosinophils Relative: 0 %
HCT: 50.8 % (ref 39.0–52.0)
Hemoglobin: 16.6 g/dL (ref 13.0–17.0)
Immature Granulocytes: 1 %
Lymphocytes Relative: 25 %
Lymphs Abs: 1.2 10*3/uL (ref 0.7–4.0)
MCH: 32.6 pg (ref 26.0–34.0)
MCHC: 32.7 g/dL (ref 30.0–36.0)
MCV: 99.8 fL (ref 80.0–100.0)
Monocytes Absolute: 0.5 10*3/uL (ref 0.1–1.0)
Monocytes Relative: 11 %
Neutro Abs: 3 10*3/uL (ref 1.7–7.7)
Neutrophils Relative %: 62 %
Platelets: 125 10*3/uL — ABNORMAL LOW (ref 150–400)
RBC: 5.09 MIL/uL (ref 4.22–5.81)
RDW: 14.8 % (ref 11.5–15.5)
WBC: 4.8 10*3/uL (ref 4.0–10.5)
nRBC: 0 % (ref 0.0–0.2)

## 2023-09-05 LAB — COMPREHENSIVE METABOLIC PANEL WITH GFR
ALT: 14 U/L (ref 0–44)
AST: 23 U/L (ref 15–41)
Albumin: 3.5 g/dL (ref 3.5–5.0)
Alkaline Phosphatase: 63 U/L (ref 38–126)
Anion gap: 13 (ref 5–15)
BUN: 19 mg/dL (ref 8–23)
CO2: 24 mmol/L (ref 22–32)
Calcium: 9 mg/dL (ref 8.9–10.3)
Chloride: 102 mmol/L (ref 98–111)
Creatinine, Ser: 1.14 mg/dL (ref 0.61–1.24)
GFR, Estimated: >60 mL/min
Glucose, Bld: 146 mg/dL — ABNORMAL HIGH (ref 70–99)
Potassium: 4.3 mmol/L (ref 3.5–5.1)
Sodium: 139 mmol/L (ref 135–145)
Total Bilirubin: 2 mg/dL — ABNORMAL HIGH (ref 0.0–1.2)
Total Protein: 7.1 g/dL (ref 6.5–8.1)

## 2023-09-05 LAB — RESP PANEL BY RT-PCR (RSV, FLU A&B, COVID)  RVPGX2
Influenza A by PCR: NEGATIVE
Influenza B by PCR: NEGATIVE
Resp Syncytial Virus by PCR: NEGATIVE
SARS Coronavirus 2 by RT PCR: NEGATIVE

## 2023-09-05 LAB — I-STAT CG4 LACTIC ACID, ED: Lactic Acid, Venous: 1.9 mmol/L (ref 0.5–1.9)

## 2023-09-05 LAB — MRSA NEXT GEN BY PCR, NASAL: MRSA by PCR Next Gen: NOT DETECTED

## 2023-09-05 MED ORDER — CARBOXYMETHYLCELLULOSE SODIUM 1 % OP SOLN
1.0000 [drp] | Freq: Four times a day (QID) | OPHTHALMIC | Status: DC
Start: 2023-09-05 — End: 2023-09-05

## 2023-09-05 MED ORDER — MENTHOL-ZINC OXIDE 0.44-20.6 % EX OINT
1.0000 | TOPICAL_OINTMENT | Freq: Every day | CUTANEOUS | Status: DC
Start: 2023-09-05 — End: 2023-09-05

## 2023-09-05 MED ORDER — PANTOPRAZOLE SODIUM 40 MG IV SOLR
40.0000 mg | INTRAVENOUS | Status: DC
  Administered 2023-09-05 – 2023-09-08 (×4): 40 mg via INTRAVENOUS
  Filled 2023-09-05 (×4): qty 10

## 2023-09-05 MED ORDER — IPRATROPIUM-ALBUTEROL 0.5-2.5 (3) MG/3ML IN SOLN
3.0000 mL | RESPIRATORY_TRACT | Status: DC
  Filled 2023-09-05: qty 3

## 2023-09-05 MED ORDER — PIPERACILLIN-TAZOBACTAM 3.375 G IVPB
3.3750 g | Freq: Three times a day (TID) | INTRAVENOUS | Status: DC
  Administered 2023-09-05 – 2023-09-08 (×9): 3.375 g via INTRAVENOUS
  Filled 2023-09-05 (×9): qty 50

## 2023-09-05 MED ORDER — GNP CHEST RUB EX OINT: 1.0000 | TOPICAL_OINTMENT | Freq: Every day | CUTANEOUS | Status: DC

## 2023-09-05 MED ORDER — CEFTRIAXONE SODIUM 1 G IJ SOLR
1.0000 g | Freq: Once | INTRAMUSCULAR | Status: AC
  Administered 2023-09-05: 1 g via INTRAVENOUS
  Filled 2023-09-05: qty 10

## 2023-09-05 MED ORDER — VANCOMYCIN HCL 1250 MG/250ML IV SOLN
1250.0000 mg | Freq: Once | INTRAVENOUS | Status: AC
  Administered 2023-09-05: 1250 mg via INTRAVENOUS
  Filled 2023-09-05: qty 250

## 2023-09-05 MED ORDER — OLOPATADINE HCL 0.1 % OP SOLN
1.0000 [drp] | Freq: Two times a day (BID) | OPHTHALMIC | Status: DC
  Administered 2023-09-05 – 2023-09-09 (×8): 1 [drp] via OPHTHALMIC
  Filled 2023-09-05: qty 5

## 2023-09-05 MED ORDER — ERYTHROMYCIN 5 MG/GM OP OINT
1.0000 | TOPICAL_OINTMENT | Freq: Every day | OPHTHALMIC | Status: DC
  Administered 2023-09-05 – 2023-09-08 (×4): 1 via OPHTHALMIC
  Filled 2023-09-05 (×2): qty 3.5

## 2023-09-05 MED ORDER — CAMPHOR-MENTHOL 0.5-0.5 % EX LOTN: TOPICAL_LOTION | CUTANEOUS | Status: DC | PRN

## 2023-09-05 MED ORDER — LACTATED RINGERS IV SOLN: INTRAVENOUS | Status: AC

## 2023-09-05 MED ORDER — ENOXAPARIN SODIUM 40 MG/0.4ML IJ SOSY
40.0000 mg | PREFILLED_SYRINGE | INTRAMUSCULAR | Status: DC
  Administered 2023-09-05 – 2023-09-08 (×4): 40 mg via SUBCUTANEOUS
  Filled 2023-09-05 (×5): qty 0.4

## 2023-09-05 MED ORDER — POLYVINYL ALCOHOL 1.4 % OP SOLN
1.0000 [drp] | Freq: Four times a day (QID) | OPHTHALMIC | Status: DC
  Administered 2023-09-05 – 2023-09-09 (×14): 1 [drp] via OPHTHALMIC
  Filled 2023-09-05: qty 15

## 2023-09-05 MED ORDER — ACETAMINOPHEN 650 MG RE SUPP: 650.0000 mg | RECTAL | Status: DC | PRN

## 2023-09-05 MED ORDER — ALBUTEROL SULFATE (2.5 MG/3ML) 0.083% IN NEBU: 2.5000 mg | INHALATION_SOLUTION | RESPIRATORY_TRACT | Status: DC | PRN

## 2023-09-05 MED ORDER — LACTATED RINGERS IV BOLUS
1000.0000 mL | Freq: Once | INTRAVENOUS | Status: AC
  Administered 2023-09-05: 1000 mL via INTRAVENOUS

## 2023-09-05 MED ORDER — ACETAMINOPHEN 325 MG PO TABS: 650.0000 mg | ORAL_TABLET | Freq: Four times a day (QID) | ORAL | Status: DC | PRN

## 2023-09-05 MED ORDER — ENOXAPARIN SODIUM 40 MG/0.4ML IJ SOSY: 40.0000 mg | PREFILLED_SYRINGE | INTRAMUSCULAR | Status: DC

## 2023-09-05 MED ORDER — LABETALOL HCL 5 MG/ML IV SOLN
10.0000 mg | Freq: Once | INTRAVENOUS | Status: DC
  Filled 2023-09-05: qty 4

## 2023-09-05 MED ORDER — VANCOMYCIN HCL IN DEXTROSE 1-5 GM/200ML-% IV SOLN: 1000.0000 mg | INTRAVENOUS | Status: DC

## 2023-09-05 MED ORDER — SODIUM CHLORIDE 0.9 % IV SOLN
500.0000 mg | Freq: Once | INTRAVENOUS | Status: DC
  Administered 2023-09-05: 500 mg via INTRAVENOUS
  Filled 2023-09-05: qty 5

## 2023-09-05 MED ORDER — ACETAMINOPHEN 10 MG/ML IV SOLN
1000.0000 mg | Freq: Four times a day (QID) | INTRAVENOUS | Status: AC | PRN
  Administered 2023-09-05: 1000 mg via INTRAVENOUS
  Filled 2023-09-05: qty 100

## 2023-09-05 NOTE — Hospital Course (Addendum)
Andrew Osborne is a 83 y.o.male with a history of chronic aspiration, dysphagia, Barret's esophagus, hiatal hernia, GERD, esophageal stricture with previous impaction requiring 3 dilations presenting from [facility] who was admitted to the [facility] Medicine Teaching Service at [facility] for hypoxia with gurgling respirations in respiratory distress. His hospital course is detailed below:  Acute Hypoxic Respiratory Failure Sepsis:  Presented to the ED for increased WOB likely secondary to aspiration event especially with history of chronic aspiration and esophageal stricture requiring dilations and tracheostomy for severe aspiration in the past. Abx given in hospital Zosyn (1/25-1/28), Vanc (1/25-1/26) with transition to Augmentin for oral abx.  Blood cultures showed NGTD x4d.   Unfortunately patient continued to have choking with feeding throughout the admission.  Was evaluated by SLP who recommended sitting up in chair for meals, following bites with sips, and offering frequent small meals instead of 3 large meals a day.  Recommended patient staying upright for at least 30 minutes after meals.  Also put patient on dysphagia 1 (puree) diet.  Given patient's persistent high aspiration risk discussed palliative care with family.  After discussion family decided to discharge with hospice.  Urinary Retention  Required foley after multiple I/O. Was started on Doxazosin after admission as he could not take Flomax (unable to be crushed and could not swallow whole).  After speaking with daughter, decided to leave Foley catheter in on discharge for patient's comfort. Will send prescription for Doxazosin to SNF in the event that pt has a void trial and stays on that medication for comfort.   Other chronic conditions were medically managed with home medications and formulary alternatives as necessary (Dry eyes, GERD, Dementia, Insomnia, dizziness, pressure ulcer, gilberts)  SNF: SNF staff OR hospice staff is  able to take out the foley when necessary.

## 2023-09-05 NOTE — Progress Notes (Signed)
Pharmacy Antibiotic Note  SHAHRAM ALEXOPOULOS is a 83 y.o. male admitted on 09/05/2023 presenting with respiratory failure, concern for pna/pneumonitis.  Pharmacy has been consulted for vancomycin and zosyn dosing.  Plan: Vancomycin 1250 mg IV x 1, then 1G IV q 24h (eAUC 527) Add MRSA PCR Zosyn 3.375g IV q 8h (extended 4h infusion) Monitor renal function, Cx/PCR to narrow Vancomycin levels as indicated      Temp (24hrs), Avg:100.4 F (38 C), Min:97.6 F (36.4 C), Max:103.2 F (39.6 C)  Recent Labs  Lab 09/05/23 0946  WBC 4.8  CREATININE 1.14  LATICACIDVEN 1.9    CrCl cannot be calculated (Unknown ideal weight.).    Allergies  Allergen Reactions   Nizoral [Ketoconazole] Other (See Comments)    "Made fluid come through skin"  (was a combination of this drug w/ another drug pt can't remember)    Daylene Posey, PharmD, [facility] Clinical Pharmacist ED Pharmacist Phone # [phone] 09/05/2023 2:58 PM

## 2023-09-05 NOTE — Plan of Care (Signed)
FMTS Brief Progress Note  S: Went bedside to assess patient.  Patient in good mood, conversant, reports he is feeling well.  Daughter at bedside also reports patient doing better.   O: BP 122/76 (BP Location: Left Arm)   Pulse 75   Temp 100.1 F (37.8 C)   Resp 20   SpO2 96%   General: NAD, responsive Card: S1/S2, RRR, NRMG Resp: CTABL, good WOB Ext: Moving all extremities, no edema  A/P: ARF w/  Hypoxia Patient appears well, good WOB, lungs CTABL, responsive and in good spirits.  - plans per day team  Hypotension Blood pressure notably soft 85/62. Will give fluid bolus.  - LR 1L bolus over 4 hours - Orders reviewed. Labs for AM ordered, which was adjusted as needed.   Bess Kinds, MD 09/05/2023, 8:35 PM PGY-3, [facility] Night Resident  Please page [phone] with questions.

## 2023-09-05 NOTE — ED Notes (Signed)
1st lactic acid 1.9=Normal.

## 2023-09-05 NOTE — H&P (Cosign Needed Addendum)
Hospital Admission History and Physical Service Pager: [phone]  Patient name: Andrew Osborne Medical record number: [id number] Date of Birth: 1941/03/04 Age: 83 y.o. Gender: male  Primary Care Provider: Smiley Houseman, NP Consultants: PCCM Code Status: DNR/DNI Preferred Emergency Contact:  Contact Information     Name Relation Home Work Mobile   Reighter,[name] Daughter [phone]      Chief Complaint: hypoxia  Assessment and Plan: Andrew Osborne is a 83 y.o. male with history of chronic aspiration, dysphagia, Barrett's esophagus, hiatal hernia, GERD, esophageal stricture with previous esophageal impaction requiring 3 dilations, hx of tracheostomy, Gilbert's syndrome, dementia, BPH, and frequent UTIs presenting from [facility] with hypoxia with concern for aspiration due to gurgling respirations and respiratory distress with O2 sat of 70 upon arrival. Differential for acute hypoxic respiratory failure: Aspiration pneumonitis - most likely cause with hx of aspiration and report of incident. Especially with witnessed oropharyngeal contents along with dyspnea which improved upon NTS and NRB.  Due to possible aspiration into the lungs there could be an infection especially with cough, fever, and dyspnea.  Bronchitis - no chronic lung conditions noted and no hx of fevers or increased cough other than when choking. PE - unlikely with inciting event being aspiration. No tachycardia except when he fevered.  Assessment & Plan Acute respiratory failure with hypoxia (HCC) Likely secondary to aspiration event especially with history of chronic aspiration and esophageal stricture requiring dilations and tracheostomy for severe aspiration in the past. Currently on a puree diet at [facility], but still has difficulty tolerating according to family. Coughs when he is choking, otherwise no history of viral symptoms. WOB has improved since arrival s/p NRB and now on [place] with saturations  in the mid 90s, doesn't use oxygen at baseline. Fevered to 103.2 in the ED after receiving Rocephin and Azithromycin. Blood cultures and urine cultures collected after abx.  - Admit to FMTS, attending Dr. Pollie Meyer - Progressive, Vital signs per floor - PCCM left recs and signed off - NPO with mental status - Broaden Rocephin and Azithromycin to Vancomycin and Zosyn  - DuoNeb q4h scheduled - Albuterol PRN - IV Pantoprazole 40 daily - NTS PRN to clear secretions - SLP eval - RVP pending - AM CBC and BMP - Fall precautions - IV Tylenol PRN for pain and fevers - Consider PT + incentive spirometry if patient's mental status improves - Palliative care consult - Avoid feeding tube after (see PCCM note attestation as well) Sepsis (HCC) Meets sepsis criteria with RR > 20 and Temp of 103.2 rectally. Received Rocephin and Azithromycin in the ED. Likely respiratory source given known aspiration and hypoxia although CXR unrevealing. UTI possible with hx, UA positive for leukocytes and hematuria, but negative for nitrites. Sacral wound does not appear acutely infected. - Abx: Vanc and Zosyn - MRSA swab - Sputum cxs - LR 100 mL/hr - Blood cultures x 2 s/p rocephin and azithromycin - UA and culture s/p rocephin and azithromycin Mental status alteration Hx of advanced dementia. Family notes that recently he has been off from his baseline, but it fluctuates a lot for him. Reports that he is more fatigued and doesn't communicates much at his baseline. Usually on Memantine and Donepezil at home for dementia. CT head negative. Possibly worsened by acute infection/hypoxia. - Holding Memantine and Donepezil w/ current NPO status, awaiting SLP recs - Delirium precautions  Chronic and Stable Problems:  BPH: on Flomax, currently held Dry eyes: Reordered - artificial  tears QID, Erythromycin ophthalmic ointment - 1 application into both eyes at bedtime, and Olopatadine - 1 drop into both eyes daily GERD: on  Omeprazole and Famotidine per chart, currently holding Famotidine Dementia: Memantine and Donepezil at home, currently held Insomnia: Trazodone nightly, currently held Dizziness: Meclizine PRN, currently held  Pressure Ulcer: Barrier cream ordered Gilbert's: Bilirubin elevated to 2, elevated in past hospitalizations  FEN/GI: NPO / IV Pantoprazole VTE Prophylaxis: Lovenox  Disposition: Progressive  History of Present Illness:  Andrew Osborne is a 83 y.o. male presenting with concern for aspiration and significant hypoxia during which he was cyanotic and saturating in the 64s from his ALF.  Reportedly he aspirated a large amount of liquid this morning and was found in respiratory distress and cyanotic in the extremities and face.  Has difficulty swallowing at baseline, but aspirated on Ensure this morning.    History obtained by daughter [name] at bedside. At baseline not very vocal.  Has periods of lucidity. Sometimes takes 3 minutes to answer.  Says that on Wednesday he was very interactive and he was very sleepy.  Fell on Sunday when he was trying to get up from his chair and fell back onto the chair, no LOC or head strike.  Feels he is more tired than usual since.  States he has a puree diet and even then has not helped him gain much weight and is sometimes hard for him to keep down. This morning he was given an Ensure and aspirated at memory care unit at [facility].  Reports that he coughs excessively when choking.  Did not spit up anything.    Hx of many UTI and BPH on Flomax.  No foley catheter use.  Uses diaper and needs to be changed frequently since he shares that he has to go to pee, but then sometimes won't.  No recent complaints of dysuria or urgency, but reports normally having increased frequency (goes to the bathroom every 10 minutes at baseline).  Endorses hearing difficulty and lost his hearing aids.  Denies any fevers, abdominal pain, N/V/D.   EMS reported there was 20 cc  that they were able to get out of his mouth on scene initially.  Patient was put on a NRB and transported to the hospital.  Saturations improved however he remained quite tachypneic and had loud coarse gurgling breath sounds on exam.  In the ED, patient was hypoxic in respiratory distress with gurgling respirations and appeared cyanotic requiring NRB.  PCCM was consulted by the EDP and deferred admission to ICU due to code status and improvement in WOB.  Code status was confirmed with family and PCCM as DNR/DNI.  Patient received nasal suctioning with resolution of work of breathing.  Patient was afebrile, normotensive to mildly hypertensive for which they considered labetalol.  Has weaned to [facility].  CBC and CMP wnl except total bili is 2.0, which has been elevated in the past.  Considered BiPAP due to initial concern for flash pulmonary edema with elevated blood pressure but deferred due to concern of further aspiration.  CT head (negative for acute intracranial abnormality) was obtained due to daughter reporting that he had a fall last week where he did not hit his head but since then he has been more somnolent and fatigued.  Admitted to progressive with antibiotics and awaiting UA due to concern for possible pneumonitis.  Review Of Systems: As above  Pertinent Past Medical History: Chronic aspiration Dysphagia Barrett's esophagus Hiatal hernia GERD Esophageal stricture  with previous esophageal impaction requiring 3 dilations Gilbert's syndrome Dementia BPH Frequent UTIs  Remainder reviewed in history tab.   Pertinent Past Surgical History: Tracheostomy Remainder reviewed in history tab.  Pertinent Social History: Tobacco use: Never Alcohol use: Former, none currently Other Substance use: None Lives with memory care unit [facility]  Pertinent Family History: Mother: Liver, lung, and colon cancer Father: Alzheimer's disease Brother: T2DM Remainder reviewed in history tab.    Important Outpatient Medications: Trazodone 50 mg nightly Flomax 0.4 mg nightly Omeprazole 20 mg BID Memantine 10 mg BID Donepezil 10 mg nightly Vitamin B12 1000 mcg q 2 weeks Vitamin D 2000 units daily (?) Artificial tears QID Erythromycin ophthalmic ointment - 1 application into both eyes at bedtime Olopatadine - 1 drop into both eyes daily Meclizine 12.5 mg TID PRN Loperamide 2 mg 4 times daily PRN Dextromethorphan-guaifenesin 1 capsule q6h PRN Guaifenesin 10 mL q6h PRN for cough Ensure Calprotect ointment apply at bedtime to buttocks for incontinence Remainder reviewed in medication history.   Objective: BP (!) 148/71   Pulse 65   Temp 97.6 F (36.4 C) (Tympanic)   Resp 18   SpO2 97%  Exam: General: Somnolent in NAD. Laying comfortably in bed with occasional coughs. HEENT: NCAT. No rhinorrhea. Pupils reactive to light b/l Cardiovascular: RRR. No M/R/G Respiratory: CTAB, increased WOB on 5L [place]. No wheezing, crackles, rhonchi, or diminished breath sounds anteriorly. Abdomen: Soft, non-tender, non-distended. Bowel sounds normoactive Extremities: No BLE edema, no deformities or significant joint findings. No foot ulcers noted. Some hand tremors (has at baseline but worse today) Skin: Cold and dry. Stage 1 pressure ulcer over buttocks.  Neuro: Responds to name, but not to commands. GCS 9 E(2) V(4) M(3)    Labs:  CBC BMET  Recent Labs  Lab 09/05/23 0946  WBC 4.8  HGB 16.6  HCT 50.8  PLT 125*   Recent Labs  Lab 09/05/23 0946  NA 139  K 4.3  CL 102  CO2 24  BUN 19  CREATININE 1.14  GLUCOSE 146*  CALCIUM 9.0    Respiratory panel negative Total bilirubin: 2.0 Lactic acid: 1.9  EKG: Rate 61, NSR, no ST elevation, no prolonged QTc  Imaging Studies Performed: CXR 1. No acute cardiopulmonary findings. 2. Symmetric nodular densities at the lung bases are probably nipple shadows. Repeat chest x-ray with nipple recommended to confirm. 3. 3rd nodular  density in the left mid lung stable back to 07/28/2018 consistent with benign etiology.  CT Head w/o contrast No evidence of acute intracranial abnormality.  Fortunato Curling, DO 09/05/2023, 1:15 PM PGY-1, [facility]  FPTS Intern pager: [phone], text pages welcome Secure chat group [facility] Teaching Service   Upper Level Addendum:  I have seen and evaluated this patient along with Dr. Fatima Blank and reviewed the above note, making necessary revisions as appropriate.  I agree with the medical decision making and physical exam as noted above.  Levin Erp, MD PGY-3 [facility] Family Medicine Residency

## 2023-09-05 NOTE — Assessment & Plan Note (Addendum)
Meets sepsis criteria with RR > 20 and Temp of 103.2 rectally. Received Rocephin and Azithromycin in the ED. Likely respiratory source given known aspiration and hypoxia although CXR unrevealing. UTI possible with hx, UA positive for leukocytes and hematuria, but negative for nitrites. Sacral wound does not appear acutely infected. - Abx: Vanc and Zosyn - MRSA swab - Sputum cxs - LR 100 mL/hr - Blood cultures x 2 s/p rocephin and azithromycin - UA and culture s/p rocephin and azithromycin

## 2023-09-05 NOTE — Assessment & Plan Note (Addendum)
Likely secondary to aspiration event especially with history of chronic aspiration and esophageal stricture requiring dilations and tracheostomy for severe aspiration in the past. Currently on a puree diet at [facility], but still has difficulty tolerating according to family. Coughs when he is choking, otherwise no history of viral symptoms. WOB has improved since arrival s/p NRB and now on [place] with saturations in the mid 90s, doesn't use oxygen at baseline. Fevered to 103.2 in the ED after receiving Rocephin and Azithromycin. Blood cultures and urine cultures collected after abx.  - Admit to FMTS, attending Dr. Pollie Meyer - Progressive, Vital signs per floor - PCCM left recs and signed off - NPO with mental status - Broaden Rocephin and Azithromycin to Vancomycin and Zosyn  - DuoNeb q4h scheduled - Albuterol PRN - IV Pantoprazole 40 daily - NTS PRN to clear secretions - SLP eval - RVP pending - AM CBC and BMP - Fall precautions - IV Tylenol PRN for pain and fevers - Consider PT + incentive spirometry if patient's mental status improves - Palliative care consult - Avoid feeding tube after (see PCCM note attestation as well)

## 2023-09-05 NOTE — Progress Notes (Signed)
Upon arrival RT NasalTracheal suctioned Pt due to upper respiratory secretions. RT aspirated copious white/thin secretions. Per MD hold BiPAP for now Pt on NRB at this time. RT will continue to monitor.
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
## 2023-09-05 NOTE — ED Notes (Signed)
Pt daughter, [name], at the bedside. She stated pt had similar situation back in July. Pt was admitted at [facility] at the time. The providers there tried to place a tube in esophagus but was unsuccessful. At the time the options were g-tube or puree diet. Pt opted for puree diet.   [name] states pt is a DNR.

## 2023-09-05 NOTE — ED Triage Notes (Signed)
Pt bib ems from [facility] c/o aspiration. EMS stated staff says pt normally aspirates; however, today he aspirated approximately 20-30 cc of puree food. Pt has audible rhonchi sounds and cough. EMS noted pt 80% RA and placed on 15L non-rebreather at 92%. JVD  BP 200/100  Hr 90 CBG 130 RR 16

## 2023-09-05 NOTE — ED Notes (Signed)
RN and NT provided pericare, changed linens, and brief.

## 2023-09-05 NOTE — Progress Notes (Signed)
Pt transitioned from NRB to 3L [place] and is tolerating well. Pt was auscultated and clear bilateral breath sounds were heard. Pt showing no signs of respiratory distress at this time NTS held for now.

## 2023-09-05 NOTE — ED Provider Notes (Signed)
[place] [facility]MENT AT [facility] Provider Note   CSN: [id number] Arrival date & time: 09/05/23  1191     History  Chief Complaint  Patient presents with   Aspiration    Andrew Osborne is a 83 y.o. male.  The history is provided by the patient and medical records. No language interpreter was used.  Shortness of Breath Severity:  Severe Onset quality:  Sudden Timing:  Constant Progression:  Worsening Chronicity:  Recurrent Context: not URI   Context comment:  Aspiration Relieved by:  Nothing Worsened by:  Nothing Ineffective treatments:  None tried Associated symptoms: sputum production   Associated symptoms: no abdominal pain, no chest pain, no cough, no headaches, no vomiting and no wheezing        Home Medications Prior to Admission medications   Medication Sig Start Date End Date Taking? Authorizing Provider  Camphor-Eucalyptus-Menthol (GNP CHEST RUB) OINT Apply 1 application  topically at bedtime. To feet and toenails    [provider]  carboxymethylcellulose (ARTIFICIAL TEARS) 1 % ophthalmic solution Place 1 drop into both eyes 4 (four) times daily.    [provider]  cyanocobalamin (VITAMIN B12) 1000 MCG/ML injection Inject 1,000 mcg into the muscle every 14 (fourteen) days.    [provider]  Dextromethorphan-guaiFENesin (CORICIDIN HBP CONGESTION/COUGH PO) Take 1 capsule by mouth every 6 (six) hours as needed (cold symptoms).    [provider]  donepezil (ARICEPT) 10 MG tablet Take 10 mg by mouth at bedtime.    [provider]  erythromycin ophthalmic ointment Place 1 Application into both eyes at bedtime. 10/01/21   [provider]  guaiFENesin (GERI-TUSSIN) 100 MG/5ML liquid Take 200 mg by mouth every 6 (six) hours as needed for cough.    [provider]  meclizine (ANTIVERT) 12.5 MG tablet Take 12.5 mg by mouth every 8 (eight) hours as needed for dizziness.    [provider]  memantine (NAMENDA) 10 MG tablet TAKE 1 TABLET BY MOUTH TWICE A DAY 03/14/21   Ngetich, Dinah C, NP  Olopatadine HCl 0.2 % SOLN Place 1 drop into both eyes daily.    [provider]  omeprazole (PRILOSEC) 20 MG capsule Take 20 mg by mouth 2 (two) times daily before a meal.    [provider]  Skin Protectants, Misc. (BAZA PROTECT EX) Apply 1 application  topically See admin instructions. Apply topically 3 times daily and as needed    [provider]  tamsulosin (FLOMAX) 0.4 MG CAPS capsule TAKE 1 CAPSULE BY MOUTH EVERYDAY AT BEDTIME 10/22/20   Sharon Seller, NP  terbinafine (LAMISIL) 1 % cream Apply topically 2 (two) times daily. Apply to both feet twice a day, leave socks off in the nighttime. 03/02/23   Leroy Sea, MD  traZODone (DESYREL) 50 MG tablet TAKE 1 TABLET BY MOUTH EVERYDAY AT BEDTIME 04/05/21   Sharon Seller, NP      Allergies    Nizoral [ketoconazole]    Review of Systems   Review of Systems  Unable to perform ROS: Severe respiratory distress (respitaroy distress)  Constitutional:  Negative for chills and fatigue.  Respiratory:  Positive for sputum production, chest tightness and shortness of breath. Negative for cough and wheezing.   Cardiovascular:  Negative for chest pain, palpitations and leg swelling.  Gastrointestinal:  Negative for abdominal pain and vomiting.  Genitourinary:  Negative for dysuria.  Musculoskeletal:  Negative for back pain.  Neurological:  Negative for  headaches.  Psychiatric/Behavioral:  Positive for agitation.   All other systems reviewed and are negative.   Physical Exam Updated Vital Signs BP (!) 166/97 (BP Location: Left Arm)   Pulse 81   Temp 97.6 F (36.4 C) (Tympanic)   Resp (!) 28   SpO2 98%  Physical Exam Vitals and nursing note reviewed.  Constitutional:      General: He is in acute distress.     Appearance: He is well-developed. He is ill-appearing. He is not toxic-appearing  or diaphoretic.  HENT:     Head: Normocephalic and atraumatic.     Mouth/Throat:     Mouth: Mucous membranes are moist.  Eyes:     Extraocular Movements: Extraocular movements intact.     Conjunctiva/sclera: Conjunctivae normal.     Pupils: Pupils are equal, round, and reactive to light.  Cardiovascular:     Rate and Rhythm: Regular rhythm. Tachycardia present.     Heart sounds: No murmur heard. Pulmonary:     Effort: Respiratory distress present.     Breath sounds: Rhonchi and rales present. No wheezing.  Chest:     Chest wall: No tenderness.  Abdominal:     Palpations: Abdomen is soft.     Tenderness: There is no abdominal tenderness.  Musculoskeletal:        General: No swelling or tenderness.     Cervical back: Neck supple. No tenderness.  Skin:    General: Skin is warm and dry.     Capillary Refill: Capillary refill takes less than 2 seconds.     Findings: No erythema.  Neurological:     General: No focal deficit present.     Mental Status: He is alert.  Psychiatric:        Mood and Affect: Mood normal.     ED Results / Procedures / Treatments   Labs (all labs ordered are listed, but only abnormal results are displayed) Labs Reviewed  CBC WITH DIFFERENTIAL/PLATELET - Abnormal; Notable for the following components:      Result Value   Platelets 125 (*)    All other components within normal limits  COMPREHENSIVE METABOLIC PANEL - Abnormal; Notable for the following components:   Glucose, Bld 146 (*)    Total Bilirubin 2.0 (*)    All other components within normal limits  RESP PANEL BY RT-PCR (RSV, FLU A&B, COVID)  [id number]  I-STAT CG4 LACTIC ACID, ED  I-STAT CG4 LACTIC ACID, ED    EKG EKG Interpretation Date/Time:  Saturday September 05 2023 10:49:29 EST Ventricular Rate:  61 PR Interval:  168 QRS Duration:  98 QT Interval:  412 QTC Calculation: 415 R Axis:   49  Text Interpretation: Sinus rhythm Low voltage, precordial leads when compared to prior,  similar appearance. No STEMI Confirmed by Theda Belfast ([id number]) on 09/05/2023 11:06:57 AM  Radiology CT HEAD WO CONTRAST ( ) Result Date: 09/05/2023 CLINICAL DATA:  Mental status change, unknown cause Fall last week and has been more somnolent since. Had an aspiration event today. EXAM: CT HEAD WITHOUT CONTRAST TECHNIQUE: Contiguous axial images were obtained from the base of the skull through the vertex without intravenous contrast. RADIATION DOSE REDUCTION: This exam was performed according to the departmental dose-optimization program which includes automated exposure control, adjustment of the mA and/or kV according to patient size and/or use of iterative reconstruction technique. COMPARISON:  CT head 02/27/2023. FINDINGS: Brain: No evidence of acute infarction, hemorrhage, hydrocephalus, extra-axial collection or mass lesion/mass effect. Cerebral atrophy. Vascular: No hyperdense  vessel.  Calcific atherosclerosis. Skull: No acute fracture. Sinuses/Orbits: Clear sinuses.  No acute orbital findings. Other: No mastoid effusions. IMPRESSION: No evidence of acute intracranial abnormality. Electronically Signed   By: Feliberto Harts M.D.   On: 09/05/2023 12:22   DG Chest Port 1 View Result Date: 09/05/2023 CLINICAL DATA:  Productive cough. EXAM: PORTABLE CHEST 1 VIEW COMPARISON:  02/27/2023 FINDINGS: The lungs are clear without focal pneumonia, edema, pneumothorax or pleural effusion. Symmetric nodular densities at the lung bases are probably nipple shadows. A third nodule is seen over the left mid lung stable back to a chest x-ray of 07/28/2018 consistent with benign etiology. Insert no consolidation insert no effusion Telemetry leads overlie the chest. IMPRESSION: 1. No acute cardiopulmonary findings. 2. Symmetric nodular densities at the lung bases are probably nipple shadows. Repeat chest x-ray with nipple recommended to confirm. 3. 3rd nodular density in the left mid lung stable back to 07/28/2018  consistent with benign etiology. Electronically Signed   By: Kennith Center M.D.   On: 09/05/2023 10:44    Procedures Procedures    CRITICAL CARE Performed by: Canary Brim Jorge Retz Total critical care time: 35 minutes Critical care time was exclusive of separately billable procedures and treating other patients. Critical care was necessary to treat or prevent imminent or life-threatening deterioration. Critical care was time spent personally by me on the following activities: development of treatment plan with patient and/or surrogate as well as nursing, discussions with consultants, evaluation of patient's response to treatment, examination of patient, obtaining history from patient or surrogate, ordering and performing treatments and interventions, ordering and review of laboratory studies, ordering and review of radiographic studies, pulse oximetry and re-evaluation of patient's condition.  Medications Ordered in ED Medications  labetalol (NORMODYNE) injection 10 mg (0 mg Intravenous Hold 09/05/23 0957)  cefTRIAXone (ROCEPHIN) 1 g in sodium chloride 0.9 % 100 mL IVPB (has no administration in time range)  azithromycin (ZITHROMAX) 500 mg in sodium chloride 0.9 % 250 mL IVPB (has no administration in time range)    ED Course/ Medical Decision Making/ A&P                                 Medical Decision Making Amount and/or Complexity of Data Reviewed Labs: ordered. Radiology: ordered.  Risk Prescription drug management. Decision regarding hospitalization.    Andrew Osborne is a 83 y.o. male With a past medical history significant for GERD, hyperlipidemia, dementia, kidney disease, and previous cholecystectomy who presents with respiratory distress after aspiration event.  Patient brought in from facility where he aspirated a large amount of a liquid this morning and was found in respiratory distress and cyanotic in extremities and face.  Patient reportedly has some difficulty  with swallowing at baseline but ended up having a liquid type food this morning and aspirated large amount of it.  EMS reported there was about 20 cc they were able to get out of his mouth and on scene initially.  They report that patient's appearance was quite cyanotic in his lips face and extremities and oxygen saturations were in the 70s.  Patient put on nonrebreather and brought in.  Oxygen saturations improved into the 90s but he was quite tachypneic and had extremely loud coarse gurgling breath sounds.  Patient arrives in respiratory distress.  On my initial evaluation, breath sounds sound like a washing machine or like someone is just gurgling mouthwash.  Breath sounds were  extremely coarse bilaterally.  Chest and abdomen were nontender.  Patient following commands and was moving all extremities with good strength symmetrically.  Pupils are symmetric and reactive.  Patient is in respiratory distress.  Decision was made to try suction first before intubation or attempting BiPAP.  Nasal suction was performed by rest or therapy and a large amount of fluid was able to be removed from his airway.  Breath sounds improved slightly with his breathing and he is now more comfortable on the nonrebreather.  We discussed possible BiPAP as he did have a blood pressure over 200 systolic initially there may be a degree of flash pulmonary edema related to the blood pressure as well.  Will give a dose of labetalol to try to improve this and support him with nonrebreather initially.  We considered BiPAP but with the concern that it could push any more liquid in the airway into the lungs we may hold on this initially.  Nursing spoke to family and said that this happened before and patient needed to be trached from it.  Will call critical care given his respiratory difficulties although as of now I do not think he needs intubation but I worry that he may develop a larger pneumonitis type picture that would require  intervention.  Per EMS patient is full code but now that family arrived they are reporting he is DNR.  9:56 AM Daughter was also able to report he had a fall last week where he did not hit his head but since then he has been more somnolent and more fatigued.  Will add on a head CT to make sure there is not some subdural or some other abnormality that may have led to this mental status change and aspiration event.  He will need admission when workup is completed.  Critical care came to the patient and feel he was appropriate for progressive.  They will leave recommendations in regards to antibiotics that are likely due to prevent pneumonia.  Will get urinalysis given family report of previous UTIs and his report of fatigue and sleepiness for the last week.  Will also get the CT head as described before.  Anticipate admission to medicine after workup is completed.  12:32 PM CT head returned without acute intracranial normality.  No bleed seen.  Patient will call for admission as previously planned.        Final Clinical Impression(s) / ED Diagnoses Final diagnoses:  Aspiration into airway, initial encounter  Hypoxia   Clinical Impression: 1. Aspiration into airway, initial encounter   2. Hypoxia     Disposition: Admit  This note was prepared with assistance of Dragon voice recognition software. Occasional wrong-word or sound-a-like substitutions may have occurred due to the inherent limitations of voice recognition software.     Treshon Stannard, Canary Brim, MD 09/05/23 [phone]

## 2023-09-05 NOTE — Assessment & Plan Note (Addendum)
Hx of advanced dementia. Family notes that recently he has been off from his baseline, but it fluctuates a lot for him. Reports that he is more fatigued and doesn't communicates much at his baseline. Usually on Memantine and Donepezil at home for dementia. CT head negative. Possibly worsened by acute infection/hypoxia. - Holding Memantine and Donepezil w/ current NPO status, awaiting SLP recs - Delirium precautions

## 2023-09-06 DIAGNOSIS — J9601 Acute respiratory failure with hypoxia: Secondary | ICD-10-CM | POA: Diagnosis present

## 2023-09-06 DIAGNOSIS — Z79899 Other long term (current) drug therapy: Secondary | ICD-10-CM | POA: Diagnosis not present

## 2023-09-06 DIAGNOSIS — Z515 Encounter for palliative care: Secondary | ICD-10-CM

## 2023-09-06 DIAGNOSIS — K2270 Barrett's esophagus without dysplasia: Secondary | ICD-10-CM | POA: Diagnosis present

## 2023-09-06 DIAGNOSIS — Z85828 Personal history of other malignant neoplasm of skin: Secondary | ICD-10-CM | POA: Diagnosis not present

## 2023-09-06 DIAGNOSIS — K222 Esophageal obstruction: Secondary | ICD-10-CM | POA: Diagnosis present

## 2023-09-06 DIAGNOSIS — R0902 Hypoxemia: Secondary | ICD-10-CM | POA: Diagnosis present

## 2023-09-06 DIAGNOSIS — H9190 Unspecified hearing loss, unspecified ear: Secondary | ICD-10-CM | POA: Diagnosis present

## 2023-09-06 DIAGNOSIS — K219 Gastro-esophageal reflux disease without esophagitis: Secondary | ICD-10-CM | POA: Diagnosis present

## 2023-09-06 DIAGNOSIS — G4700 Insomnia, unspecified: Secondary | ICD-10-CM | POA: Diagnosis present

## 2023-09-06 DIAGNOSIS — Z7189 Other specified counseling: Secondary | ICD-10-CM

## 2023-09-06 DIAGNOSIS — Z87440 Personal history of urinary (tract) infections: Secondary | ICD-10-CM | POA: Diagnosis not present

## 2023-09-06 DIAGNOSIS — F03918 Unspecified dementia, unspecified severity, with other behavioral disturbance: Secondary | ICD-10-CM | POA: Diagnosis present

## 2023-09-06 DIAGNOSIS — Z1152 Encounter for screening for COVID-19: Secondary | ICD-10-CM | POA: Diagnosis not present

## 2023-09-06 DIAGNOSIS — L89151 Pressure ulcer of sacral region, stage 1: Secondary | ICD-10-CM | POA: Diagnosis present

## 2023-09-06 DIAGNOSIS — Z833 Family history of diabetes mellitus: Secondary | ICD-10-CM | POA: Diagnosis not present

## 2023-09-06 DIAGNOSIS — E785 Hyperlipidemia, unspecified: Secondary | ICD-10-CM | POA: Diagnosis present

## 2023-09-06 DIAGNOSIS — R1319 Other dysphagia: Secondary | ICD-10-CM | POA: Diagnosis present

## 2023-09-06 DIAGNOSIS — Z888 Allergy status to other drugs, medicaments and biological substances status: Secondary | ICD-10-CM | POA: Diagnosis not present

## 2023-09-06 DIAGNOSIS — Z9049 Acquired absence of other specified parts of digestive tract: Secondary | ICD-10-CM | POA: Diagnosis not present

## 2023-09-06 DIAGNOSIS — A419 Sepsis, unspecified organism: Secondary | ICD-10-CM | POA: Diagnosis present

## 2023-09-06 DIAGNOSIS — J189 Pneumonia, unspecified organism: Secondary | ICD-10-CM | POA: Diagnosis present

## 2023-09-06 DIAGNOSIS — J690 Pneumonitis due to inhalation of food and vomit: Secondary | ICD-10-CM | POA: Diagnosis present

## 2023-09-06 DIAGNOSIS — Z820 Family history of epilepsy and other diseases of the nervous system: Secondary | ICD-10-CM | POA: Diagnosis not present

## 2023-09-06 DIAGNOSIS — W19XXXA Unspecified fall, initial encounter: Secondary | ICD-10-CM | POA: Diagnosis present

## 2023-09-06 DIAGNOSIS — Z66 Do not resuscitate: Secondary | ICD-10-CM | POA: Diagnosis present

## 2023-09-06 DIAGNOSIS — N400 Enlarged prostate without lower urinary tract symptoms: Secondary | ICD-10-CM | POA: Diagnosis present

## 2023-09-06 LAB — BASIC METABOLIC PANEL
Anion gap: 7 (ref 5–15)
BUN: 23 mg/dL (ref 8–23)
CO2: 25 mmol/L (ref 22–32)
Calcium: 8.1 mg/dL — ABNORMAL LOW (ref 8.9–10.3)
Chloride: 111 mmol/L (ref 98–111)
Creatinine, Ser: 1.24 mg/dL (ref 0.61–1.24)
GFR, Estimated: 57 mL/min — ABNORMAL LOW (ref >60)
Glucose, Bld: 124 mg/dL — ABNORMAL HIGH (ref 70–99)
Potassium: 3.5 mmol/L (ref 3.5–5.1)
Sodium: 143 mmol/L (ref 135–145)

## 2023-09-06 LAB — CBC
HCT: 35.5 % — ABNORMAL LOW (ref 39.0–52.0)
Hemoglobin: 11.9 g/dL — ABNORMAL LOW (ref 13.0–17.0)
MCH: 32.4 pg (ref 26.0–34.0)
MCHC: 33.5 g/dL (ref 30.0–36.0)
MCV: 96.7 fL (ref 80.0–100.0)
Platelets: 113 10*3/uL — ABNORMAL LOW (ref 150–400)
RBC: 3.67 MIL/uL — ABNORMAL LOW (ref 4.22–5.81)
RDW: 15 % (ref 11.5–15.5)
WBC: 11.5 10*3/uL — ABNORMAL HIGH (ref 4.0–10.5)
nRBC: 0 % (ref 0.0–0.2)

## 2023-09-06 MED ORDER — TAMSULOSIN HCL 0.4 MG PO CAPS: 0.4000 mg | ORAL_CAPSULE | Freq: Every day | ORAL | Status: DC

## 2023-09-06 MED ORDER — DOXAZOSIN MESYLATE 1 MG PO TABS
1.0000 mg | ORAL_TABLET | Freq: Every day | ORAL | Status: DC
  Administered 2023-09-06 – 2023-09-09 (×4): 1 mg via ORAL
  Filled 2023-09-06 (×4): qty 1

## 2023-09-06 NOTE — Plan of Care (Signed)
Went to bedside to speak with daughter [name] and brother.  Patient sitting up and speaking.  Discussed goals of care.  At this time, we will allow patient to eat and drink-SLP has provided diet recommendations.  Daughter and brother are aware the patient is likely to continue to aspirate.  They are considering hospice versus outpatient palliative through his facility.  We discussed that currently from an infectious standpoint, this is treatable.  However, given repetitive aspiration he is at high risk of declining very quickly.  We cannot say for sure that he would have a less than 43-month life expectancy, however if he continues to aspirate or has worsening respiratory status this may change.  At this time, we will continue to treat his suspected infections and allow patient to eat.  We will monitor closely for changes and provide our recommendations accordingly.

## 2023-09-06 NOTE — Progress Notes (Deleted)
Patient is in atrial fibrillation. Patient is having frequent HR drops in the 30s with pauses no greater than 2 seconds. Noted on ECG monitor. Patient is asymptomatic. Threasa Beards MD notified. See MD notes for recommendations. Howerter MD notifed. See MD new orders.

## 2023-09-06 NOTE — Assessment & Plan Note (Addendum)
Afebrile, RR improved, BP improved with MAP >65. UA c/f possible UTI, pending urine culture.  Blood culture no growth <24 hrs. - Zosyn (1/25- ) - Continue mIVF - f/u Urine and Blood cultures

## 2023-09-06 NOTE — Care Management Obs Status (Signed)
MEDICARE OBSERVATION STATUS NOTIFICATION   Patient Details  Name: Andrew Osborne MRN: [id number] Date of Birth: [birth year]   Medicare Observation Status Notification Given:  Yes    Ronny Bacon, RN 09/06/2023, 9:21 AM

## 2023-09-06 NOTE — Progress Notes (Signed)
Daily Progress Note Intern Pager: [phone]  Patient name: Andrew Osborne Medical record number: [id number] Date of birth: Jun 16, 1941 Age: 83 y.o. Gender: male  Primary Care Provider: Smiley Houseman, NP Consultants: CCM (s/o), Palliative Code Status: DNR-DNI  Pt Overview and Major Events to Date:  1/25: Admitted  Assessment and Plan:  Andrew Osborne is a 83 y.o male with history of chronic aspiration, dysphagia, Barrett's esophagus, hiatal hernia, GERD, esophageal stricture with previous esophageal impaction requiring 3 dilations, hx of tracheostomy, Gilbert's syndrome, dementia, BPH, and frequent UTIs presenting from [facility] with hypoxia with concern for aspiration.  Patient is currently weaned to RA, with normal oxygen saturation and work of breathing. Given dysphagia, pending SLP evaluation- however, anticipate he will remain an aspiration risk and needs GOC conversation. Agree with CCM that patient would not be an appropriate feeding tube candidate. Palliative is consulted for GOC conversation, and primary team will remain engaged.  Assessment & Plan Acute respiratory failure with hypoxia (HCC) Afebrile <24 hours. On RA, normal SPO2, normal WOB. MRSA swab negative, will DC vancomycin. Continue Zosyn, consider transition to oral tomorrow if he remains improved (Possibly Augmentin/azithro) v. continued IV given aspiration risk. RVP negative. - Palliative care consulted, appreciate recommendations - Zosyn (1/25 ), Vanc (1/25-1/26) - DuoNeb q4h scheduled - Albuterol PRN - IV Pantoprazole 40 daily - NTS PRN to clear secretions - NPO pending SLP eval - SLP eval - AM CBC and BMP - Fall precautions - IV Tylenol PRN for pain and fevers - PT Sepsis (HCC) Afebrile, RR improved, BP improved with MAP >65. UA c/f possible UTI, pending urine culture.  Blood culture no growth <24 hrs. - Zosyn (1/25- ) - Continue mIVF - f/u Urine and Blood cultures Mental status  alteration Hx of advanced dementia. - Holding Memantine and Donepezil w/ current NPO status, awaiting SLP recs - Delirium precautions  Chronic and Stable Problems:  BPH: on Flomax, currently held Dry eyes: Reordered - artificial tears QID, Erythromycin ophthalmic ointment - 1 application into both eyes at bedtime, and Olopatadine - 1 drop into both eyes daily GERD: on Omeprazole and Famotidine per chart, currently holding Famotidine Dementia: Memantine and Donepezil at home, currently held Insomnia: Trazodone nightly, currently held Dizziness: Meclizine PRN, currently held  Pressure Ulcer: Barrier cream ordered Gilbert's: Bilirubin elevated to 2, elevated in past hospitalizations  FEN/GI: NPO PPx: Lovenox Dispo:SNF pending clinical improvement   Subjective:   Patient sleeping, stirred during examination but did not wake to allow patient rest.  Objective: Temp:  [97.6 F (36.4 C)-103.2 F (39.6 C)] 97.8 F (36.6 C) (01/26 0200) Pulse Rate:  [52-98] 69 (01/26 0400) Resp:  [18-29] 20 (01/25 1800) BP: (70-166)/(57-97) 138/74 (01/26 0400) SpO2:  [93 %-100 %] 99 % (01/26 0400) Physical Exam: General: NAD, sleeping comfortably, chronically ill-appearing Cardiovascular: RRR, no murmurs Respiratory: Clear to auscultation anteriorly Abdomen: Soft, not tender, not distended Extremities: No edema  Laboratory: Most recent CBC Lab Results  Component Value Date   WBC 4.8 09/05/2023   HGB 16.6 09/05/2023   HCT 50.8 09/05/2023   MCV 99.8 09/05/2023   PLT 125 (L) 09/05/2023   Most recent BMP    Latest Ref Rng & Units 09/06/2023    3:50 AM  BMP  Glucose 70 - 99 mg/dL 387   BUN 8 - 23 mg/dL 23   Creatinine 5.64 - 1.24 mg/dL 3.32   Sodium 951 - 884 mmol/L 143   Potassium 3.5 - 5.1 mmol/L  3.5   Chloride 98 - 111 mmol/L 111   CO2 22 - 32 mmol/L 25   Calcium 8.9 - 10.3 mg/dL 8.1     Andrew Kocher, DO 09/06/2023, 7:42 AM  PGY-2, [facility] FPTS Intern  pager: [phone], text pages welcome Secure chat group [facility] Teaching Service
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
## 2023-09-06 NOTE — Evaluation (Signed)
Physical Therapy Evaluation Patient Details Name: Andrew Osborne MRN: [id number] DOB: 1940/11/28 Today's Date: 09/06/2023  History of Present Illness  Pt is 83 yo presenting to [facility] ED from [facility] due to aspiration of liquid causing respiratory distress. PMH significant of  advanced dementia, recurrent UTIs, pernicious anemia, Barret's esophagus, hiatal hernia, reported distal esophageal stricture requiring multiple dilations, Gilbert's syndrome, kidney disease, B12 deficiency, Raynaud's disease, and pre-diabetes.  Clinical Impression  Pt is presenting slightly below baseline level of functioning. Prior to hospitalization daughter states pt was mostly supervision with rollator for gait. Currently pt requires CGA to Min A for gait with rollator, Min A for sit to stand and CGA for bed mobility. Due to pt current functional status, home set up and available assistance at home recommending skilled physical therapy services 3x/week in order to address strength, balance and functional mobility to decrease risk for falls, injury and re-hospitalization.           If plan is discharge home, recommend the following: A little help with walking and/or transfers;Assistance with cooking/housework;Assist for transportation;Supervision due to cognitive status     Equipment Recommendations None recommended by PT     Functional Status Assessment Patient has had a recent decline in their functional status and demonstrates the ability to make significant improvements in function in a reasonable and predictable amount of time.     Precautions / Restrictions Precautions Precautions: Fall Restrictions Weight Bearing Restrictions Per Provider Order: No      Mobility  Bed Mobility Overal bed mobility: Needs Assistance Bed Mobility: Supine to Sit, Sit to Supine     Supine to sit: Min assist, HOB elevated Sit to supine: Min assist   General bed mobility comments: Min A at trunk to get  to mid line and with LE to get to supine.    Transfers Overall transfer level: Needs assistance Equipment used: Rollator (4 wheels) Transfers: Sit to/from Stand Sit to Stand: Min assist           General transfer comment: Min A for sit to stand due to posterior COM over BOS.    Ambulation/Gait Ambulation/Gait assistance: Contact guard assist, Min assist Gait Distance (Feet): 300 Feet Assistive device: Rollator (4 wheels) Gait Pattern/deviations: Step-through pattern, Narrow base of support, Drifts right/left Gait velocity: decreased Gait velocity interpretation: <1.8 ft/sec, indicate of risk for recurrent falls   General Gait Details: decreased stride length overall, reciprocal gait pattern. Pt takes standing rest breaks periodically and drifts L/R occasionally due to difficulty with distractions during gait.      Balance Overall balance assessment: Needs assistance Sitting-balance support: Bilateral upper extremity supported, Feet supported Sitting balance-Leahy Scale: Fair   Postural control: Posterior lean Standing balance support: Bilateral upper extremity supported, During functional activity, Reliant on assistive device for balance Standing balance-Leahy Scale: Poor Standing balance comment: intermittent Min A for balance with gait especially during turns or with dual attention         Pertinent Vitals/Pain Pain Assessment Pain Assessment: No/denies pain    Home Living Family/patient expects to be discharged to:: Skilled nursing facility       Additional Comments: Pt is from [facility]    Prior Function Prior Level of Function : Needs assist             Mobility Comments: Uses Rollator occasionally but often forgets it or "loses" it in the facilty ADLs Comments: Pt requiring assist with toileting secondary to difficulty with clothing  management. Requiring Max assist with dressing and Total assist with bathing and grooming.      Extremity/Trunk Assessment   Upper Extremity Assessment Upper Extremity Assessment: Generalized weakness    Lower Extremity Assessment Lower Extremity Assessment: Generalized weakness    Cervical / Trunk Assessment Cervical / Trunk Assessment: Kyphotic  Communication   Communication Communication: Hearing impairment Cueing Techniques: Verbal cues;Tactile cues  Cognition Arousal: Alert Behavior During Therapy: WFL for tasks assessed/performed Overall Cognitive Status: History of cognitive impairments - at baseline        General Comments General comments (skin integrity, edema, etc.): No noted skin issues outside gown. Daughter present and supportive throughout session.        Assessment/Plan    PT Assessment Patient needs continued PT services  PT Problem List Decreased strength;Decreased balance;Decreased mobility       PT Treatment Interventions DME instruction;Therapeutic activities;Gait training;Therapeutic exercise;Balance training;Functional mobility training;Patient/family education    PT Goals (Current goals can be found in the Care Plan section)  Acute Rehab PT Goals Patient Stated Goal: to return to [facility] and improve mobility PT Goal Formulation: With patient/family Time For Goal Achievement: 09/20/23 Potential to Achieve Goals: Fair    Frequency Min 1X/week        AM-PAC PT "6 Clicks" Mobility  Outcome Measure Help needed turning from your back to your side while in a flat bed without using bedrails?: A Little Help needed moving from lying on your back to sitting on the side of a flat bed without using bedrails?: A Little Help needed moving to and from a bed to a chair (including a wheelchair)?: A Little Help needed standing up from a chair using your arms (e.g., wheelchair or bedside chair)?: A Little Help needed to walk in hospital room?: A Little Help needed climbing 3-5 steps with a railing? : A Lot 6 Click Score: 17    End of  Session Equipment Utilized During Treatment: Gait belt Activity Tolerance: Patient tolerated treatment well Patient left: with call bell/phone within reach;with family/visitor present;in chair Nurse Communication: Mobility status;Other (comment) (pt does not have chair alarm, daughter is aware and is to notify nursing if she leaves.) PT Visit Diagnosis: Other abnormalities of gait and mobility (R26.89);Muscle weakness (generalized) (M62.81);History of falling (Z91.81)    Time: 1610-9604 PT Time Calculation (min) (ACUTE ONLY): 24 min   Charges:   PT Evaluation $PT Eval Low Complexity: 1 Low PT Treatments $Therapeutic Activity: 8-22 mins PT General Charges $$ ACUTE PT VISIT: 1 Visit        Harrel Carina, DPT, CLT  Acute Rehabilitation Services Office: [phone] (Secure chat preferred)   Claudia Desanctis 09/06/2023, 4:18 PM

## 2023-09-06 NOTE — Assessment & Plan Note (Addendum)
Afebrile <24 hours. On RA, normal SPO2, normal WOB. MRSA swab negative, will DC vancomycin. Continue Zosyn, consider transition to oral tomorrow if he remains improved (Possibly Augmentin/azithro) v. continued IV given aspiration risk. RVP negative. - Palliative care consulted, appreciate recommendations - Zosyn (1/25 ), Vanc (1/25-1/26) - DuoNeb q4h scheduled - Albuterol PRN - IV Pantoprazole 40 daily - NTS PRN to clear secretions - NPO pending SLP eval - SLP eval - AM CBC and BMP - Fall precautions - IV Tylenol PRN for pain and fevers - PT

## 2023-09-06 NOTE — Plan of Care (Signed)

## 2023-09-06 NOTE — Consult Note (Signed)
WOC Nurse Consult Note: Reason for Consult: pressure injury Patient from SNF for aspiration  Wound type: Stage 1 Pressure Injury POA: Yes Measurement: see nursing flow sheets Wound bed:red; intact skin Drainage (amount, consistency, odor) none Periwound: intact Dressing procedure/placement/frequency: Silicone foam dressing to the sacrum, lift to assess skin under dressing each shift. Report changes to [facility] nurse, or consult WOC nurse if new area develops.  Pritesh Sobecki [facility] MSN, RN,CWOCN, CNS, CWON-AP [phone]

## 2023-09-06 NOTE — Progress Notes (Signed)
Pt's providers updated that the pt still has not urinated & pharmacy unable to approve flomax d/t pt being unable to take capsules & the medication being unable to be opened. New orders for doxazosin .Providers also update that the pt hasnnot urinated all shift after several attempts. Pt's bladder scan showed 132. Pt voided some after getting the scan but it was only 25 ml & some in his external catheter. Per pt's dgtr [name] this happened at [facility] when they took him off his flomax as well. After voiding his scan showed 80 around 16:30. Will continue to monitor the pt.  Sanda Linger, RN

## 2023-09-06 NOTE — Progress Notes (Signed)
patient has not urinated on my shift. bladder appears and feels to be slightly distended. Patient endorses a little discomfort with palpation. With palpation, patient voided 150 mL. After void bladder scan shows a remainder of 370 mL. I asked the patient to try to void but with his mentation and drowsiness patient is not able to follow though. Sowell MD notified. See new orders.

## 2023-09-06 NOTE — Plan of Care (Addendum)
Saw note from RN noting that patient was in A. Fib w/ HR dropping in 30's, and Dr. Samara Deist. Went and reviewed telemetry, P-waves present and patient not in A. Fib. Hr 50's on telemetry. This provider messaged RN about telemetry and that Dr. Threasa Beards not on tonight. RN noted that her note was entered on wrong patient, with plans to delete. Provider thanked Charity fundraiser.

## 2023-09-06 NOTE — Consult Note (Addendum)
Palliative Medicine Inpatient Consult Note  Consulting Provider: Fortunato Curling, DO   Reason for consult:   Palliative Care Consult Services Palliative Medicine Consult  Reason for Consult? Goals of care   09/06/2023  HPI:  Per intake H&P --> 82 yoM with PMH as below, significant for dementia, chronic aspiration, dysphagia, Barret's esophagus, hiatal hernia, GERD, esophageal stricture with previous esophageal impaction requiring 3 dilations previously, anemia, BPH, frequent UTIs presenting from [facility] after being found down, hypoxic with gurgling respirations in resp distress. Palliative asked to get involved to further support goals of care conversations.   Clinical Assessment/Goals of Care:  *Please note that this is a verbal dictation therefore any spelling or grammatical errors are due to the "Dragon Medical One" system interpretation.  I have reviewed medical records including EPIC notes, labs and imaging, received report from bedside RN, assessed the patient who is lying in bed in NAD.    I met with patients daughter, [name] and older brother, Annette Stable to further discuss diagnosis prognosis, GOC, EOL wishes, disposition and options.   I introduced Palliative Medicine as specialized medical care for people living with serious illness. It focuses on providing relief from the symptoms and stress of a serious illness. The goal is to improve quality of life for both the patient and the family.  Medical History Review and Understanding:  A review of Shellie's past medical history of dementia, chronic aspiration, dysphagia, Barrett's esophagus, hiatal hernia, esophageal strictures with 3 esophageal's, anemia - pernicious type, & BPH with frequent urinary infections was held.  Social History:  Iann lives in [place].  He has been married twice though his first wife passed away is separated from his second wife he has 2 children 3 grand daughters 1 grandson, and 4  great-grandchildren.  He is identified as an extremely active man who used to be "the life of the party".  He used to love waterskiing, snow skiing played tennis as well as golf 3 times weekly.  He had an affinity for the ladies per his family.  He did have a strong faith in God and a belief from where he is going after this life he practices within Christianity.  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  Functional and Nutritional State:  Preceding hospitalization Bennet has been living at [facility] for roughly 2-1/2 years.  When he first moved there he had a wonderful first year whereby he was extremely social and involved in activities though this declined after he was hospitalized at [facility] for his severe aspirational event.  He does require assistance with some B ADLs his appetite has been decreasing as he has lost 30 pounds.  Advance Directives:  A detailed discussion was had today regarding advanced directives.  A copy of these have been obtained and his daughter, Higher education careers adviser.  Code Status:  Concepts specific to code status, artifical feeding and hydration, continued IV antibiotics and rehospitalization was had.  The difference between a aggressive medical intervention path  and a palliative comfort care path for this patient at this time was had.   I completed a MOST form today. The patient and family outlined their wishes for the following treatment decisions:  Cardiopulmonary Resuscitation: Do Not Attempt Resuscitation (DNR/No CPR)  Medical Interventions: Comfort Measures: Keep clean, warm, and dry. Use medication by any route, positioning, wound care, and other measures to  relieve pain and suffering. Use oxygen, suction and manual treatment of airway obstruction as needed for comfort. Do not transfer to the hospital unless comfort needs cannot be met  in current location.  Antibiotics: Determine use of limitation of antibiotics when infection occurs  IV Fluids: IV fluids for a defined trial period  Feeding Tube: No feeding tube   Discussion:  [name] and I discussed Ardean's tumultuous history of esophageal injury secondary to his GERD.  We reviewed his history of drinking extremely hot coffee as well as potentially consuming more alcohol in his home than was previously thought prior to him moving to the facility.  [name] attributes if he was drinking large quantities to the fact that he was caring for his wife who had unhealthy opioid addiction and placed a tremendous amount of stress on him as her primary caregiver.  All of that resolved when Lenus made the choice to help from his daughter was then added to a living facility where she is apparently doing better.  We discussed Bassel's clinical decline since his aspiration event roughly 2 years ago leading to a prolonged stay at [facility] of a complicated intubation.  [name] shares that she was told if he ever needed this again he would likely not survive it.  She questions whether or not at that time she made the right decision to put him through such aggressive efforts though also notes the difficulty in seeing him struggle to breathe.  I allowed her time and space to express these and provided support through therapeutic listening.  We reviewed the concern associated with aspiration in the setting of progressive dementia moving into the future.  We discussed this is something which is often incurable as patients who have progressive dementia lose the appropriate mechanism to swallow.  We also discussed the complications of patient's aspirations given his prior esophageal history and GERD.  The differences between outpatient palliative support and outpatient hospice were described as below:   Palliative care is specialized medical care for people living with a serious illness, such as cancer,  heart failure, COPD, Alzheimer's dementia, etc. Patients in palliative care may receive medical care for their symptoms, or palliative care, along with treatment intended to cure their serious illness.  I shared that palliative support would likely only visit once every few weeks.  Hospice care focuses on the care, comfort, and quality of life of a person with a serious illness who is approaching the end of life.  We reviewed that often this timeframe is limited to 6 months or less.  Of course can exceed this. At some point, it may not be possible to cure a serious illness, or a patient may choose not to undergo certain treatments. Hospice is designed for this situation.  [name] shares that multiple physicians have brought the idea of hospice to her attention in the past.  She is interested to get more insights from the inpatient teaching service to identify what they feel would be in the patient's best interest.  I shared that I would reach out to them this morning.  Goals at this time are to continue to allow time for outcomes.    Discussed the importance of continued conversation with family and their  medical providers regarding overall plan of care and treatment options, ensuring decisions are within the context of the patients values and GOCs.  Decision Maker: Bailey Mech (Daughter): [phone] (Home Phone)   SUMMARY OF RECOMMENDATIONS   DNAR/DNI  MOST/ DNR Form Completed, paper  copy placed onto the chart electric copy can be found in Vynca  AD's can be found in Vynca  Continue current care  Honest conversations held in the setting of patient's chronic disease burden and reversible nature of recurrent aspirational event  Appreciate primary team speaking to patient's daughter regarding their recommendations and if they feel it may be appropriate for hospice at this time  Have offered to coordinate a meeting with hospice care and patient's daughter [name]   Care will continue to  follow along with Molly Maduro during his hospitalization  Code Status/Advance Care Planning: DNAR/DNI  Palliative Prophylaxis:  Aspiration, Bowel Regimen, Delirium Protocol, Frequent Pain Assessment, Oral Care, Palliative Wound Care, and Turn Reposition  Additional Recommendations (Limitations, Scope, Preferences): Continue current care  Psycho-social/Spiritual:  Desire for further Chaplaincy support: Yes Additional Recommendations: Education on Alzheimer's progression chronic aspiration   Prognosis: Limited  Discharge Planning: Discharge plan to be determined  Vitals:   09/06/23 0400 09/06/23 0700  BP: 138/74 98/61  Pulse: 69 (!) 58  Resp:    Temp:    SpO2: 99% 96%    Intake/Output Summary (Last 24 hours) at 09/06/2023 1610 Last data filed at 09/06/2023 0459 Gross per 24 hour  Intake 850.39 ml  Output 625 ml  Net 225.39 ml   Gen: Elderly Caucasian male in no acute distress HEENT: moist mucous membranes CV: Regular rate and rhythm  PULM: On 2 L nasal cannula breathing is even and nonlabored ABD: soft/nontender  EXT: No edema  Neuro: Alert and oriented to self  PPS: 40%   This conversation/these recommendations were discussed with patient primary care team, Dr. Pollie Meyer  Billing based on MDM: High  Problems Addressed: One acute or chronic illness or injury that poses a threat to life or bodily function  Amount and/or Complexity of Data: Category 3:Discussion of management or test interpretation with external physician/other qualified health care professional/appropriate source (not separately reported)  Risks: Decision regarding hospitalization or escalation of hospital care and Decision not to resuscitate or to de-escalate care because of poor prognosis ______________________________________________________ [name] [place] Palliative Medicine Team Team Cell Phone: [phone] Please utilize secure chat with additional questions, if there is no  response within 30 minutes please call the above phone number  Palliative Medicine Team providers are available by phone from 7am to 7pm daily and can be reached through the team cell phone.  Should this patient require assistance outside of these hours, please call the patient's attending physician.

## 2023-09-06 NOTE — Assessment & Plan Note (Addendum)
Hx of advanced dementia. - Holding Memantine and Donepezil w/ current NPO status, awaiting SLP recs - Delirium precautions

## 2023-09-07 DIAGNOSIS — Z7189 Other specified counseling: Secondary | ICD-10-CM | POA: Diagnosis not present

## 2023-09-07 DIAGNOSIS — Z515 Encounter for palliative care: Secondary | ICD-10-CM | POA: Diagnosis not present

## 2023-09-07 DIAGNOSIS — J9601 Acute respiratory failure with hypoxia: Secondary | ICD-10-CM | POA: Diagnosis not present

## 2023-09-07 LAB — CBC
HCT: 34.9 % — ABNORMAL LOW (ref 39.0–52.0)
Hemoglobin: 11.8 g/dL — ABNORMAL LOW (ref 13.0–17.0)
MCH: 32.6 pg (ref 26.0–34.0)
MCHC: 33.8 g/dL (ref 30.0–36.0)
MCV: 96.4 fL (ref 80.0–100.0)
Platelets: 112 10*3/uL — ABNORMAL LOW (ref 150–400)
RBC: 3.62 MIL/uL — ABNORMAL LOW (ref 4.22–5.81)
RDW: 14.6 % (ref 11.5–15.5)
WBC: 7.1 10*3/uL (ref 4.0–10.5)
nRBC: 0 % (ref 0.0–0.2)

## 2023-09-07 LAB — URINALYSIS, ROUTINE W REFLEX MICROSCOPIC
Bilirubin Urine: NEGATIVE
Glucose, UA: NEGATIVE mg/dL
Ketones, ur: NEGATIVE mg/dL
Nitrite: NEGATIVE
Protein, ur: 30 mg/dL — AB
Specific Gravity, Urine: 1.013 (ref 1.005–1.030)
WBC, UA: >50 WBC/hpf (ref 0–5)
pH: 7 (ref 5.0–8.0)

## 2023-09-07 LAB — BASIC METABOLIC PANEL
Anion gap: 10 (ref 5–15)
BUN: 19 mg/dL (ref 8–23)
CO2: 23 mmol/L (ref 22–32)
Calcium: 8.1 mg/dL — ABNORMAL LOW (ref 8.9–10.3)
Chloride: 103 mmol/L (ref 98–111)
Creatinine, Ser: 1.17 mg/dL (ref 0.61–1.24)
GFR, Estimated: >60 mL/min (ref >60)
Glucose, Bld: 86 mg/dL (ref 70–99)
Potassium: 3.3 mmol/L — ABNORMAL LOW (ref 3.5–5.1)
Sodium: 136 mmol/L (ref 135–145)

## 2023-09-07 MED ORDER — POTASSIUM CHLORIDE 10 MEQ/100ML IV SOLN
10.0000 meq | INTRAVENOUS | Status: AC
  Administered 2023-09-07 (×4): 10 meq via INTRAVENOUS
  Filled 2023-09-07 (×4): qty 100

## 2023-09-07 MED ORDER — CHLORHEXIDINE GLUCONATE CLOTH 2 % EX PADS
6.0000 | MEDICATED_PAD | Freq: Every day | CUTANEOUS | Status: DC
  Administered 2023-09-07 – 2023-09-09 (×3): 6 via TOPICAL

## 2023-09-07 NOTE — Assessment & Plan Note (Addendum)
Afebrile, RR improved, BP improved with MAP >65. UA with large leukocytes, overall unchanged from prior U/A in July 2024, pending urine culture.  Blood culture no growth x2d. - Zosyn (1/25- ) - will likely transition to PO abx tomorrow - f/u Urine and Blood cultures

## 2023-09-07 NOTE — Progress Notes (Signed)
Urine reiduals >300 ml despite voids greater than . Indwelling placed.

## 2023-09-07 NOTE — Progress Notes (Signed)
   Palliative Medicine Inpatient Follow Up Note HPI: 83 yo M with PMH as below, significant for dementia, chronic aspiration, dysphagia, Barret's esophagus, hiatal hernia, GERD, esophageal stricture with previous esophageal impaction requiring 3 dilations previously, anemia, BPH, frequent UTIs presenting from [facility] after being found down, hypoxic with gurgling respirations in resp distress. Palliative asked to get involved to further support goals of care conversations.   Today's Discussion 09/07/2023  *Please note that this is a verbal dictation therefore any spelling or grammatical errors are due to the "Dragon Medical One" system interpretation.  Chart reviewed inclusive of vital signs, progress notes, laboratory results, and diagnostic images.   I met with Molly Maduro at bedside in the presence of his granddaughter. Llewellyn is resting comfortably and has apparently been on and off sleeping throughout the morning.  We were able to call patients daughter, [name] over the speaker-phone. We discussed patients present health and possibility of ongoing aspiration events in the future. Patients daughter shares that she has reached out [facility] and confirmed they work with [employer]. She plans to meet with them once patient is discharged and for the time being pursue palliative care.   We  discussed scenarios for when hospice might be appropriate to consider further.  [name] was very thankful for the Palliative care team(s) involvement.  Questions and concerns addressed/Palliative Support Provided.   Objective Assessment: Vital Signs Vitals:   09/07/23 0628 09/07/23 0806  BP: 139/69 117/70  Pulse: (!) 51 (!) 58  Resp:  17  Temp: 97.7 F (36.5 C) 98.6 F (37 C)  SpO2:  95%    Intake/Output Summary (Last 24 hours) at 09/07/2023 1333 Last data filed at 09/07/2023 1033 Gross per 24 hour  Intake 426.04 ml  Output 1085 ml  Net -658.96 ml   Last Weight  Most recent update:  09/06/2023  8:33 AM    Weight  59.1 kg (130 lb 4.8 oz)            Gen: Elderly Caucasian male in no acute distress HEENT: moist mucous membranes CV: Regular rate and rhythm  PULM: On RA, breathing is even and nonlabored ABD: soft/nontender  EXT: No edema  Neuro: Alert and oriented to self  SUMMARY OF RECOMMENDATIONS   DNAR/DNI   MOST/ DNR Form Completed, paper copy placed onto the chart electric copy can be found in Vynca   AD's can be found in Vynca   Continue current care   Patients family plan to pursue Palliative support through [facility] once he goes back to [facility]. Daughter [name] shares that she will communicate directly with Hassel Neth about setting up a meeting with [facility].    Care will continue to follow along with Molly Maduro during his hospitalization  Billing based on MDM: Moderate  ______________________________________________________________________________________ [name] [place] Palliative Medicine Team Team Cell Phone: [phone] Please utilize secure chat with additional questions, if there is no response within 30 minutes please call the above phone number  Palliative Medicine Team providers are available by phone from 7am to 7pm daily and can be reached through the team cell phone.  Should this patient require assistance outside of these hours, please call the patient's attending physician.

## 2023-09-07 NOTE — Plan of Care (Signed)

## 2023-09-07 NOTE — Assessment & Plan Note (Addendum)
Afebrile >24 hours. On RA, normal SPO2, normal WOB. RVP negative. Continue Zosyn, consider transitioning to oral tomorrow.    - Palliative care consulted, appreciate recommendations - Zosyn (1/25-), Vanc (1/25-1/26) - DuoNeb q4h scheduled - Albuterol PRN - IV Pantoprazole 40 daily - NTS PRN to clear secretions - Dysphagia 1 diet - SLP eval - AM CBC and BMP - Fall precautions - IV Tylenol PRN for pain and fevers - PT

## 2023-09-07 NOTE — TOC Initial Note (Signed)
Transition of Care [facility]) - Initial/Assessment Note    Patient Details  Name: Andrew Osborne MRN: [id number] Date of Birth: Sep 21, 1940  Transition of Care [facility]) CM/SW Contact:    Delilah Shan, LCSWA Phone Number: 09/07/2023, 11:29 AM  Clinical Narrative:                  CSW spoke with patients daughter [name]. [name] informed CSW PTA patient comes from [facility]. [name] confirmed plan is for patient to return back to [facility] when patient is medically ready for dc. [name] informed CSW when patient is medically ready for dc that she plans on her husband transporting him back to facility. CSW LVM with [facility]. CSW awaiting call back. CSW will continue to follow and assist with patients dc planning needs.   Expected Discharge Plan: Memory Care Barriers to Discharge: Continued Medical Work up   Patient Goals and CMS Choice     Choice offered to / list presented to : Adult Children (daughter [name])      Expected Discharge Plan and Services In-house Referral: Clinical Social Work     Living arrangements for the past 2 months:  (memory care)                                      Prior Living Arrangements/Services Living arrangements for the past 2 months:  (memory care) Lives with:: Facility Resident Patient language and need for interpreter reviewed:: Yes        Need for Family Participation in Patient Care: Yes (Comment) Care giver support system in place?: Yes (comment)   Criminal Activity/Legal Involvement Pertinent to Current Situation/Hospitalization: No - Comment as needed  Activities of Daily Living   ADL Screening (condition at time of admission) Independently performs ADLs?: No Does the patient have a NEW difficulty with bathing/dressing/toileting/self-feeding that is expected to last >3 days?: No Does the patient have a NEW difficulty with getting in/out of bed, walking, or climbing stairs that is expected to  last >3 days?: Yes (Initiates electronic notice to provider for possible PT consult) Does the patient have a NEW difficulty with communication that is expected to last >3 days?: Yes (Initiates electronic notice to provider for possible SLP consult) Is the patient deaf or have difficulty hearing?: No Does the patient have difficulty seeing, even when wearing glasses/contacts?: No Does the patient have difficulty concentrating, remembering, or making decisions?: Yes  Permission Sought/Granted Permission sought to share information with : Case Manager, Magazine features editor, Family Supports                Emotional Assessment       Orientation: : Oriented to Self Alcohol / Substance Use: Not Applicable Psych Involvement: No (comment)  Admission diagnosis:  Hypoxia [R09.02] Acute respiratory failure with hypoxia (HCC) [J96.01] Aspiration into airway, initial encounter [T17.908A] Patient Active Problem List   Diagnosis Date Noted   Acute respiratory failure with hypoxia (HCC) 09/05/2023   Mental status alteration 09/05/2023   Pain due to onychomycosis of toenails of both feet 07/02/2023   Urinary tract infection 02/27/2023   Sepsis (HCC) 02/27/2023   Prediabetes 07/21/2019   Tinea corporis 12/09/2018   Esophageal thrush (HCC) 12/09/2018   Hyperglycemia 12/09/2018   Lower extremity edema 12/09/2018   Dementia without behavioral disturbance (HCC) 12/09/2018   Senile purpura (HCC) 04/29/2018   H/O benign neoplasm of bladder  04/29/2018   Basal cell carcinoma (BCC) of right cheek 04/29/2018   Raynaud's disease without gangrene 04/29/2018   Weight loss 04/29/2018   Benign prostatic hyperplasia (BPH) with straining on urination 04/29/2018   Pernicious anemia 04/29/2018   HTN (hypertension) 09/14/2013   B12 deficiency 08/31/2013   Proteins serum plasma low 08/31/2013   Hypogammaglobulinaemia, unspecified 08/31/2013   Thrush 08/23/2013   Rash and nonspecific skin  eruption 01/17/2013   Gastroesophageal reflux disease 01/11/2013   Dyslipidemia 01/11/2013   Hematuria 01/11/2013   Weight loss, unintentional 01/11/2013   Cholelithiasis with cholecystitis 09/21/2012   PCP:  Smiley Houseman, NP Pharmacy:   [facility] [address] [place], [place] - [address] [place] [address] Phone: [phone] Fax: [phone]  [facility] [address] [place] [address] Phone: [phone] Fax: [phone]     Social Drivers of Health (SDOH) Social History: SDOH Screenings   Food Insecurity: No Food Insecurity (09/05/2023)  Housing: Low Risk  (09/06/2023)  Transportation Needs: No Transportation Needs (09/05/2023)  Utilities: Not At Risk (09/05/2023)  Alcohol Screen: Low Risk  (08/02/2018)  Depression (PHQ2-9): Low Risk  (10/25/2021)  Social Connections: Unknown (09/05/2023)  Tobacco Use: Low Risk  (09/05/2023)   SDOH Interventions:     Readmission Risk Interventions     No data to display

## 2023-09-07 NOTE — Progress Notes (Signed)
Mobility Specialist Progress Note;   09/07/23 1550  Mobility  Activity Ambulated with assistance in hallway  Level of Assistance Moderate assist, patient does 50-74%  Assistive Device Four wheel walker  Distance Ambulated (ft) 100 ft  Activity Response Tolerated well  Mobility Referral Yes  Mobility visit 1 Mobility  Mobility Specialist Start Time (ACUTE ONLY) 1550  Mobility Specialist Stop Time (ACUTE ONLY) 1613  Mobility Specialist Time Calculation (min) (ACUTE ONLY) 23 min   Pt agreeable to mobility. Daughter present during session. Required ModA to stand from bedside and MinA throughout ambulation for safety. Heavy L sided lean that daughter mentions as well. VSS throughout and no c/o during session. Pt returned back to bed with all needs met, alarm on. Daughter present.   Caesar Bookman Mobility Specialist Please contact via SecureChat or [employer] [phone]

## 2023-09-07 NOTE — Progress Notes (Signed)
Daily Progress Note Intern Pager: [phone]  Patient name: Andrew Osborne Medical record number: [id number] Date of birth: 02-Oct-1940 Age: 83 y.o. Gender: male  Primary Care Provider: Smiley Houseman, NP Consultants: CCM (signed off), palliative Code Status:   Code Status: Limited: Do not attempt resuscitation (DNR) -DNR-LIMITED -Do Not Intubate/DNI    Pt Overview and Major Events to Date:  1/25 - admitted  Assessment and Plan:  82yo with PMHx chronic aspiration, dysphagia, Barrett's esophagus, hiatal hernia, GERD, esophageal stricture, tracheostomy, Gilbert's syndrome, dementia, BPH, frequent UTIs, presenting from [facility] with hypoxia in the setting of aspiration pneumonia.   Currently on room air. Will continue to monitoring how he does with feeding - if minor aspiration could d/c back to facility, if concern for major aspiration will need to continue goals of care conversations and consider discharge with hospice Assessment & Plan Acute respiratory failure with hypoxia (HCC) Afebrile >24 hours. On RA, normal SPO2, normal WOB. RVP negative. Continue Zosyn, consider transitioning to oral tomorrow.    - Palliative care consulted, appreciate recommendations - Zosyn (1/25-), Vanc (1/25-1/26) - DuoNeb q4h scheduled - Albuterol PRN - IV Pantoprazole 40 daily - NTS PRN to clear secretions - Dysphagia 1 diet - SLP eval - AM CBC and BMP - Fall precautions - IV Tylenol PRN for pain and fevers - PT Sepsis (HCC) Afebrile, RR improved, BP improved with MAP >65. UA with large leukocytes, overall unchanged from prior U/A in July 2024, pending urine culture.  Blood culture no growth x2d. - Zosyn (1/25- ) - will likely transition to PO abx tomorrow - f/u Urine and Blood cultures Mental status alteration Hx of advanced dementia. - Holding Memantine and Donepezil - Delirium precautions  Chronic and Stable Problems:  BPH: on Flomax, currently held Dry eyes: Reordered -  artificial tears QID, Erythromycin ophthalmic ointment - 1 application into both eyes at bedtime, and Olopatadine - 1 drop into both eyes daily GERD: on Omeprazole and Famotidine per chart, currently holding Famotidine Dementia: Memantine and Donepezil at home, currently held Insomnia: Trazodone nightly, currently held Dizziness: Meclizine PRN, currently held  Pressure Ulcer: Barrier cream ordered Gilbert's: Bilirubin elevated to 2, elevated in past hospitalizations, stable   FEN/GI: dysphagia 1 PPx: lovenox Dispo:SNF pending clinical improvement   Subjective:  NAEON. Daughter states he tolerated dinner last night.   Objective: Temp:  [97.6 F (36.4 C)-98.7 F (37.1 C)] 97.7 F (36.5 C) (01/27 0628) Pulse Rate:  [51-71] 51 (01/27 0628) Resp:  [16-20] 18 (01/26 2009) BP: (106-139)/(58-95) 139/69 (01/27 0628) SpO2:  [96 %-98 %] 97 % (01/26 1613) Weight:  [59.1 kg] 59.1 kg (01/26 0833) Physical Exam: General: NAD, resting comfortably Cardiovascular: RRR, no m/r/g Respiratory: normal work of breathing on RA, CTAB Abdomen: Normal bowel sounds, soft, non-tender Extremities: No swelling BLE  Laboratory: Most recent CBC Lab Results  Component Value Date   WBC 7.1 09/07/2023   HGB 11.8 (L) 09/07/2023   HCT 34.9 (L) 09/07/2023   MCV 96.4 09/07/2023   PLT 112 (L) 09/07/2023   Most recent BMP    Latest Ref Rng & Units 09/07/2023    3:53 AM  BMP  Glucose 70 - 99 mg/dL 86   BUN 8 - 23 mg/dL 19   Creatinine 1.47 - 1.24 mg/dL 8.29   Sodium 562 - 130 mmol/L 136   Potassium 3.5 - 5.1 mmol/L 3.3   Chloride 98 - 111 mmol/L 103   CO2 22 - 32 mmol/L 23  Calcium 8.9 - 10.3 mg/dL 8.1    Andrew Radke, DO 09/07/2023, 7:16 AM  PGY-1, [facility] FPTS Intern pager: [phone], text pages welcome Secure chat group [facility] Teaching Service

## 2023-09-07 NOTE — Assessment & Plan Note (Addendum)
Hx of advanced dementia. - Holding Memantine and Donepezil - Delirium precautions

## 2023-09-08 DIAGNOSIS — Z7189 Other specified counseling: Secondary | ICD-10-CM | POA: Diagnosis not present

## 2023-09-08 DIAGNOSIS — Z515 Encounter for palliative care: Secondary | ICD-10-CM | POA: Diagnosis not present

## 2023-09-08 DIAGNOSIS — J9601 Acute respiratory failure with hypoxia: Secondary | ICD-10-CM | POA: Diagnosis not present

## 2023-09-08 LAB — BASIC METABOLIC PANEL
Anion gap: 7 (ref 5–15)
BUN: 13 mg/dL (ref 8–23)
CO2: 26 mmol/L (ref 22–32)
Calcium: 8.5 mg/dL — ABNORMAL LOW (ref 8.9–10.3)
Chloride: 107 mmol/L (ref 98–111)
Creatinine, Ser: 1.18 mg/dL (ref 0.61–1.24)
GFR, Estimated: >60 mL/min (ref >60)
Glucose, Bld: 93 mg/dL (ref 70–99)
Potassium: 3.8 mmol/L (ref 3.5–5.1)
Sodium: 140 mmol/L (ref 135–145)

## 2023-09-08 LAB — CBC
HCT: 37.7 % — ABNORMAL LOW (ref 39.0–52.0)
Hemoglobin: 12.7 g/dL — ABNORMAL LOW (ref 13.0–17.0)
MCH: 32.4 pg (ref 26.0–34.0)
MCHC: 33.7 g/dL (ref 30.0–36.0)
MCV: 96.2 fL (ref 80.0–100.0)
Platelets: 121 10*3/uL — ABNORMAL LOW (ref 150–400)
RBC: 3.92 MIL/uL — ABNORMAL LOW (ref 4.22–5.81)
RDW: 14.7 % (ref 11.5–15.5)
WBC: 5.3 10*3/uL (ref 4.0–10.5)
nRBC: 0 % (ref 0.0–0.2)

## 2023-09-08 MED ORDER — AMOXICILLIN-POT CLAVULANATE 875-125 MG PO TABS
1.0000 | ORAL_TABLET | Freq: Two times a day (BID) | ORAL | Status: DC
  Administered 2023-09-08 – 2023-09-09 (×3): 1 via ORAL
  Filled 2023-09-08 (×2): qty 1

## 2023-09-08 NOTE — Assessment & Plan Note (Addendum)
Afebrile, RR improved, BP improved with MAP >65. UA with large leukocytes, overall unchanged from prior U/A in July 2024, pending urine culture.  Blood culture no growth x3d. - Zosyn (1/25- 1/28) - Bcx NG x3d

## 2023-09-08 NOTE — TOC Progression Note (Signed)
Transition of Care [facility]) - Progression Note    Patient Details  Name: Andrew Osborne MRN: [id number] Date of Birth: 1940-12-30  [facility] CM/SW Contact  Delilah Shan, LCSWA Phone Number: 09/08/2023, 3:25 PM  Clinical Narrative:     CSW informed by MD that patient will be returning back to [facility] memory care with hospice services to follow. Contact at [facility] is Joy tele#[phone] or melanie [phone]. Fax# [phone]. MD informed CSW after speaking with family they have decided for patient to transport by PTAR when medically ready for dc. CSW will continue to follow and assist with patients dc planning needs.  Expected Discharge Plan: Memory Care Barriers to Discharge: Continued Medical Work up  Expected Discharge Plan and Services In-house Referral: Clinical Social Work     Living arrangements for the past 2 months:  (memory care)                                       Social Determinants of Health (SDOH) Interventions SDOH Screenings   Food Insecurity: No Food Insecurity (09/05/2023)  Housing: Low Risk  (09/06/2023)  Transportation Needs: No Transportation Needs (09/05/2023)  Utilities: Not At Risk (09/05/2023)  Alcohol Screen: Low Risk  (08/02/2018)  Depression (PHQ2-9): Low Risk  (10/25/2021)  Social Connections: Unknown (09/05/2023)  Tobacco Use: Low Risk  (09/05/2023)    Readmission Risk Interventions     No data to display

## 2023-09-08 NOTE — Assessment & Plan Note (Signed)
Hx of advanced dementia. - Holding Memantine and Donepezil - Delirium precautions

## 2023-09-08 NOTE — Progress Notes (Signed)
Mobility Specialist Progress Note;   09/08/23 1554  Mobility  Activity Refused mobility   Pts family declined mobility on his behalf. Pt asleep in chair and family member stated that he needed his rest. Will f/u as schedule permits.   Caesar Bookman Mobility Specialist Please contact via SecureChat or [employer] [phone]

## 2023-09-08 NOTE — Progress Notes (Signed)
SLP Cancellation Note  Patient Details Name: Andrew Osborne MRN: [id number] DOB: 1940-09-10   Cancelled treatment:       Reason Eval/Treat Not Completed: Other (comment). Pt resting up in chair with brother at bedside. Daughter [name] had gone for the day but I called her and answered her questions and addressed her concerns. At this time Mr. Rockett has been mostly sleepy with limited intake and ongoing intermittent coughing. [name] and her husband and daughter have been carefully feeding pt small amounts with recommended strategies to mitigate risk, but to continue to offer food and liquid for comfort. Nadine Counts did have one good meal last night. Overall diet and strategies remain appropriate. There is no plan for any further testing and all support is in the form of careful feeding methods when pt alert and interested in eating, which is less and less often.     Janan Bogie, Riley Nearing 09/08/2023, 1:20 PM

## 2023-09-08 NOTE — Assessment & Plan Note (Addendum)
Afebrile >24 hours. On RA, normal SPO2, normal WOB. RVP negative. Transitioned to PO abx today.  - Palliative care consulted, appreciate recommendations - Zosyn (1/25-1/28), Vanc (1/25-1/26) - Transition to PO Augmentin 875-125mg  x5 doses - Albuterol PRN - IV Pantoprazole 40 daily - NTS PRN to clear secretions - Dysphagia 1 diet - SLP eval - AM CBC and BMP - Fall precautions - IV Tylenol PRN for pain and fevers - PT

## 2023-09-08 NOTE — Progress Notes (Addendum)
[facility] Liaison Note:  Notified by [facility] manager of patient/family request for [facility] Palliative services at home after discharge.   Please call with any hospice or outpatient palliative care related questions.   Thank you for the opportunity to participate in this patient's care.   Glenna Fellows, BSN, RN, OCN [facility] [phone]  After further discussion, his daughter has decided to change to hospice in facility plan. DME has been requested and will anticipate discharge tomorrow.  Thank you, Ines Bloomer

## 2023-09-08 NOTE — Progress Notes (Signed)
Daily Progress Note Intern Pager: [phone]  Patient name: Andrew Osborne Medical record number: [id number] Date of birth: 07-Oct-1940 Age: 83 y.o. Gender: male  Primary Care Provider: Smiley Houseman, NP Consultants: palliative care Code Status:   Code Status: Limited: Do not attempt resuscitation (DNR) -DNR-LIMITED -Do Not Intubate/DNI    Pt Overview and Major Events to Date:  1/25 - admitted  Assessment and Plan:  82yo with PMHx chronic aspiration, dysphagia, Barrett's esophagus, hiatal hernia, GERD, esophageal stricture, tracheostomy, Gilbert's syndrome, dementia, BPH, frequent UTIs, presenting from [facility] with hypoxia in the setting of aspiration pneumonia.  Will need to speak with daughter today regarding goals of care and dispo planning.  Assessment & Plan Acute respiratory failure with hypoxia (HCC) Afebrile >24 hours. On RA, normal SPO2, normal WOB. RVP negative. Transitioned to PO abx today.  - Palliative care consulted, appreciate recommendations - Zosyn (1/25-1/28), Vanc (1/25-1/26) - Transition to PO Augmentin 875-125mg  x5 doses - Albuterol PRN - IV Pantoprazole 40 daily - NTS PRN to clear secretions - Dysphagia 1 diet - SLP eval - AM CBC and BMP - Fall precautions - IV Tylenol PRN for pain and fevers - PT Sepsis (HCC) Afebrile, RR improved, BP improved with MAP >65. UA with large leukocytes, overall unchanged from prior U/A in July 2024, pending urine culture.  Blood culture no growth x3d. - Zosyn (1/25- 1/28) - Bcx NG x3d Mental status alteration Hx of advanced dementia. - Holding Memantine and Donepezil - Delirium precautions   Chronic and Stable Problems:  BPH: on Flomax, currently held Dry eyes: Reordered - artificial tears QID, Erythromycin ophthalmic ointment - 1 application into both eyes at bedtime, and Olopatadine - 1 drop into both eyes daily GERD: on Omeprazole and Famotidine per chart, currently holding Famotidine Dementia:  Memantine and Donepezil at home, currently held Insomnia: Trazodone nightly, currently held Dizziness: Meclizine PRN, currently held  Pressure Ulcer: Barrier cream ordered Gilbert's: Bilirubin elevated to 2, elevated in past hospitalizations, stable   FEN/GI: dysphagia 1 PPx: lovenox Dispo:SNF pending clinical improvement   Subjective:  Daughter thinks pt has been more tired and less of an appetite since yesterday. Choked on 3 pieces of food yesterday.   Objective: Temp:  [97.6 F (36.4 C)-98.6 F (37 C)] 97.6 F (36.4 C) (01/28 0432) Pulse Rate:  [50-61] 50 (01/28 0432) Resp:  [17] 17 (01/27 1711) BP: (98-117)/(61-70) 112/61 (01/27 2039) SpO2:  [95 %-96 %] 95 % (01/28 0432) Physical Exam: General: NAD, resting comfortably Cardiovascular: RRR, no m/r/g Respiratory: normal work of breathing on RA, CTAB Abdomen: Normal bowel sounds, soft, non-tender Extremities: No swelling BLE  Laboratory: Most recent CBC Lab Results  Component Value Date   WBC 5.3 09/08/2023   HGB 12.7 (L) 09/08/2023   HCT 37.7 (L) 09/08/2023   MCV 96.2 09/08/2023   PLT 121 (L) 09/08/2023   Most recent BMP    Latest Ref Rng & Units 09/08/2023    4:21 AM  BMP  Glucose 70 - 99 mg/dL 93   BUN 8 - 23 mg/dL 13   Creatinine 1.47 - 1.24 mg/dL 8.29   Sodium 562 - 130 mmol/L 140   Potassium 3.5 - 5.1 mmol/L 3.8   Chloride 98 - 111 mmol/L 107   CO2 22 - 32 mmol/L 26   Calcium 8.9 - 10.3 mg/dL 8.5     Ieisha Gao, DO 09/08/2023, 7:07 AM  PGY-1, [facility] FPTS Intern pager: [phone], text pages welcome Secure  chat group [facility] Teaching Service

## 2023-09-08 NOTE — Progress Notes (Signed)
Physical Therapy Treatment Patient Details Name: Andrew Osborne MRN: [id number] DOB: 1941/06/27 Today's Date: 09/08/2023   History of Present Illness Pt is 83 yo presenting to [facility] ED from [facility] due to aspiration of liquid causing respiratory distress. PMH significant of  advanced dementia, recurrent UTIs, pernicious anemia, Barret's esophagus, hiatal hernia, reported distal esophageal stricture requiring multiple dilations, Gilbert's syndrome, kidney disease, B12 deficiency, Raynaud's disease, and pre-diabetes.    PT Comments  Pt admitted with above diagnosis. Pt able to ambulate in hallway with Rollator and min assist.  Pt needs constant support for ambulation and daughter states he varies in assist level needed from hour to hour. Daughter states that [facility] Memory care will provide level of assist pt needs. Pt will need HHPT f/u at facility.   Pt currently with functional limitations due to the deficits listed below (see PT Problem List). Pt will benefit from acute skilled PT to increase their independence and safety with mobility to allow discharge.       If plan is discharge home, recommend the following: A little help with walking and/or transfers;Assistance with cooking/housework;Assist for transportation;Supervision due to cognitive status   Can travel by private vehicle        Equipment Recommendations  None recommended by PT    Recommendations for Other Services       Precautions / Restrictions Precautions Precautions: Fall Restrictions Weight Bearing Restrictions Per Provider Order: No     Mobility  Bed Mobility Overal bed mobility: Needs Assistance Bed Mobility: Supine to Sit, Sit to Supine     Supine to sit: Min assist, HOB elevated Sit to supine: Min assist   General bed mobility comments: Min A at trunk to get to mid line and with LE to get to supine.    Transfers Overall transfer level: Needs assistance Equipment used: Rollator  (4 wheels) Transfers: Sit to/from Stand Sit to Stand: Min assist           General transfer comment: Min A for sit to stand due to posterior COM over BOS.    Ambulation/Gait Ambulation/Gait assistance: Min assist, +2 safety/equipment Gait Distance (Feet): 300 Feet Assistive device: Rollator (4 wheels) Gait Pattern/deviations: Step-through pattern, Narrow base of support, Drifts right/left, Trunk flexed Gait velocity: decreased Gait velocity interpretation: <1.31 ft/sec, indicative of household ambulator   General Gait Details: decreased stride length overall, reciprocal gait pattern. Pt takes standing rest breaks periodically and drifts L/R occasionally due to difficulty with distractions during gait. Overall poor postural stability.   Stairs             Wheelchair Mobility     Tilt Bed    Modified Rankin (Stroke Patients Only)       Balance Overall balance assessment: Needs assistance Sitting-balance support: Bilateral upper extremity supported, Feet supported Sitting balance-Leahy Scale: Fair   Postural control: Posterior lean Standing balance support: Bilateral upper extremity supported, During functional activity, Reliant on assistive device for balance Standing balance-Leahy Scale: Poor Standing balance comment: intermittent Min A for balance with gait especially during turns or with dual attention                            Cognition Arousal: Alert Behavior During Therapy: WFL for tasks assessed/performed Overall Cognitive Status: History of cognitive impairments - at baseline  Exercises General Exercises - Lower Extremity Ankle Circles/Pumps: AROM, Both, 10 reps, Supine Long Arc Quad: AROM, Both, 5 reps, Seated Hip Flexion/Marching: AROM, Both, 5 reps, Seated    General Comments        Pertinent Vitals/Pain Pain Assessment Pain Assessment: No/denies pain    Home Living                           Prior Function            PT Goals (current goals can now be found in the care plan section) Acute Rehab PT Goals Patient Stated Goal: to return to [facility] and improve mobility Progress towards PT goals: Progressing toward goals    Frequency    Min 1X/week      PT Plan      Co-evaluation              AM-PAC PT "6 Clicks" Mobility   Outcome Measure  Help needed turning from your back to your side while in a flat bed without using bedrails?: A Little Help needed moving from lying on your back to sitting on the side of a flat bed without using bedrails?: A Little Help needed moving to and from a bed to a chair (including a wheelchair)?: A Little Help needed standing up from a chair using your arms (e.g., wheelchair or bedside chair)?: A Little Help needed to walk in hospital room?: A Little Help needed climbing 3-5 steps with a railing? : A Lot 6 Click Score: 17    End of Session Equipment Utilized During Treatment: Gait belt Activity Tolerance: Patient tolerated treatment well Patient left: with call bell/phone within reach;with family/visitor present;in chair Nurse Communication: Mobility status PT Visit Diagnosis: Other abnormalities of gait and mobility (R26.89);Muscle weakness (generalized) (M62.81);History of falling (Z91.81)     Time: 1014-1040 PT Time Calculation (min) (ACUTE ONLY): 26 min  Charges:    $Gait Training: 23-37 mins PT General Charges $$ ACUTE PT VISIT: 1 Visit                     Andrew Osborne M,PT Acute Rehab Services [phone]    Andrew Osborne 09/08/2023, 12:44 PM

## 2023-09-08 NOTE — Progress Notes (Signed)
   Palliative Medicine Inpatient Follow Up Note HPI: 83 yo M with PMH as below, significant for dementia, chronic aspiration, dysphagia, Barret's esophagus, hiatal hernia, GERD, esophageal stricture with previous esophageal impaction requiring 3 dilations previously, anemia, BPH, frequent UTIs presenting from [facility] after being found down, hypoxic with gurgling respirations in resp distress. Palliative asked to get involved to further support goals of care conversations.   Today's Discussion 09/08/2023  *Please note that this is a verbal dictation therefore any spelling or grammatical errors are due to the "[employer]agon [employer] One" system interpretation.  Chart reviewed inclusive of vital signs, progress notes, laboratory results, and diagnostic images.   I met with Andrew Osborne at bedside in the presence of his daughter, Andrew Osborne.  Andrew Osborne voices some concerns about patient in the setting of his ongoing aspiration risk. She shares worry that he was lying with his food in front of him. Patients RN, Andrew Osborne was able to comment on this situation I particular. Provided insights on patient remaining upright 30 minutes after eating as well as not having a food tray with in access. He is required to be closely  monitored while eating/drinking.   We discussed that Andrew Osborne has been sleeping more since being here and less interactive. He has also had a decrease in his appetite. We reviewed if this were to continue when he discharges that perhaps considering hospice sooner than not would be within reason.   Patients daughter, Andrew Osborne is agreeable to meeting with hospice for further conversations about their Palliative to Hospice services.   [name] himself is quite pleasant this morning. He denies pain, shortness of breath, or nausea.   Questions and concerns addressed/Palliative Support Provided.   Objective Assessment: Vital Signs Vitals:   09/08/23 0432 09/08/23 0804  BP:  122/78  Pulse: (!) 50 (!) 53   Resp:  16  Temp: 97.6 F (36.4 C) 98.7 F (37.1 C)  SpO2: 95% 94%    Intake/Output Summary (Last 24 hours) at 09/08/2023 1052 Last data filed at 09/08/2023 1045 Gross per 24 hour  Intake 696 ml  Output 2800 ml  Net -2104 ml   Last Weight  Most recent update: 09/06/2023  8:33 AM    Weight  59.1 kg (130 lb 4.8 oz)            Gen: Elderly Caucasian male in no acute distress HEENT: moist mucous membranes CV: Regular rate and rhythm  PULM: On RA, breathing is even and nonlabored ABD: soft/nontender  EXT: No edema  Neuro: Alert and oriented to self  SUMMARY OF RECOMMENDATIONS   DNAR/DNI   MOST/ DNR Form Completed, paper copy placed onto the chart electric copy can be found in [facility]   AD's can be found in [facility]   Continue current care   Appreciate [facility] liaison meeting with patients daughter, Andrew Osborne   Care will continue to follow along with Andrew Osborne during his hospitalization  Billing based on MDM: Moderate  ______________________________________________________________________________________ Andrew Osborne Andrew Osborne Palliative Medicine Team Team Cell Phone: [phone] Please utilize secure chat with additional questions, if there is no response within 30 minutes please call the above phone number  Palliative Medicine Team providers are available by phone from 7am to 7pm daily and can be reached through the team cell phone.  Should this patient require assistance outside of these hours, please call the patient's attending physician.

## 2023-09-08 NOTE — Plan of Care (Signed)
Spoke with [name] (Daughter) by phone.  Discussed the natural history of her father's condition, including concern he will likely continue to decline in setting of aspiration.  Primary team recommends pursuing hospice care.  Additional recommendations as below.  [name] has agreed to pursue hospice care.  Primary team has reached out to palliative, SLP and social work to help coordinate his dispo and transition to hospice.  Recommendations Keep foley in place for urinary retention and comfort. If there is desire to remove foley at facility, recommend Doxazosin. Recommend discontinuing antibiotics at discharge  Most home meds can be discontinued. Continue eye drops for comfort. Consider keeping trazodone if it benefits sleep.

## 2023-09-09 ENCOUNTER — Other Ambulatory Visit (HOSPITAL_COMMUNITY): Payer: Self-pay

## 2023-09-09 DIAGNOSIS — J9601 Acute respiratory failure with hypoxia: Secondary | ICD-10-CM | POA: Diagnosis not present

## 2023-09-09 DIAGNOSIS — Z7189 Other specified counseling: Secondary | ICD-10-CM | POA: Diagnosis not present

## 2023-09-09 DIAGNOSIS — Z515 Encounter for palliative care: Secondary | ICD-10-CM | POA: Diagnosis not present

## 2023-09-09 MED ORDER — DOXAZOSIN MESYLATE 2 MG PO TABS
1.0000 mg | ORAL_TABLET | Freq: Every day | ORAL | 0 refills | Status: DC
  Filled 2023-09-09: qty 15, 30d supply, fill #0

## 2023-09-09 MED ORDER — DOXAZOSIN MESYLATE 1 MG PO TABS: 1.0000 mg | ORAL_TABLET | Freq: Every day | ORAL | 0 refills | Status: DC

## 2023-09-09 MED ORDER — DOXAZOSIN MESYLATE 1 MG PO TABS: 1.0000 mg | ORAL_TABLET | Freq: Every day | ORAL | Status: DC

## 2023-09-09 NOTE — Progress Notes (Signed)
     Daily Progress Note Intern Pager: [phone]  Patient name: Andrew Osborne Medical record number: [id number] Date of birth: 07-26-1941 Age: 83 y.o. Gender: male  Primary Care Provider: Smiley Houseman, NP Consultants: palliative care Code Status:   Code Status: Limited: Do not attempt resuscitation (DNR) -DNR-LIMITED -Do Not Intubate/DNI   Pt Overview and Major Events to Date:  1/25 - admitted  Assessment and Plan:  82yo with PMHx chronic aspiration, dysphagia, Barrett's esophagus, hiatal hernia, GERD, esophageal stricture, tracheostomy, Gilbert's syndrome, dementia, BPH, frequent UTIs, presenting from [facility] with hypoxia in the setting of aspiration pneumonia. Discharged today to [facility] with hospice Assessment & Plan Acute respiratory failure with hypoxia (HCC) On RA, normal SPO2, normal WOB. RVP negative. Transitioned to PO abx yesterday.  - Palliative care consulted, appreciate recommendations - Zosyn (1/25-1/28), Vanc (1/25-1/26) - Transition to PO Augmentin 1/28 875-125mg  x5 doses - Albuterol PRN - IV Pantoprazole 40 daily - NTS PRN to clear secretions - Dysphagia 1 diet - SLP eval - AM CBC and BMP - Fall precautions - IV Tylenol PRN for pain and fevers - PT Sepsis (HCC) Afebrile, RR improved, BP improved with MAP >65. UA with large leukocytes, overall unchanged from prior U/A in July 2024, pending urine culture.  Blood culture no growth x4d. - Zosyn (1/25- 1/28) - Bcx NG x4d Mental status alteration Hx of advanced dementia. - Holding Memantine and Donepezil - Delirium precautions   Chronic and Stable Problems:  BPH: on Flomax, currently held Dry eyes: Reordered - artificial tears QID, Erythromycin ophthalmic ointment - 1 application into both eyes at bedtime, and Olopatadine - 1 drop into both eyes daily GERD: on Omeprazole and Famotidine per chart, currently holding Famotidine Dementia: Memantine and Donepezil at home, currently  held Insomnia: Trazodone nightly, currently held Dizziness: Meclizine PRN, currently held  Pressure Ulcer: Barrier cream ordered Gilbert's: Bilirubin elevated to 2, elevated in past hospitalizations, stable   FEN/GI: dysphagia 1 PPx: lovenox Dispo:SNF   Subjective:  NAEON, no concerns this morning  Objective: Temp:  [98.2 F (36.8 C)-98.7 F (37.1 C)] 98.2 F (36.8 C) (01/29 0449) Pulse Rate:  [53-65] 53 (01/29 0449) Resp:  [14-18] 14 (01/29 0449) BP: (96-127)/(63-82) 106/72 (01/29 0449) SpO2:  [94 %-96 %] 95 % (01/28 1939) Physical Exam: General: no acute distress, resting comfortably Cardiovascular: RRR, no m/r/g Respiratory: normal work of breathing on RA, CTAB Abdomen: Normal bowel sounds, soft, non-tender Extremities: No swelling BLE  Laboratory: Most recent CBC Lab Results  Component Value Date   WBC 5.3 09/08/2023   HGB 12.7 (L) 09/08/2023   HCT 37.7 (L) 09/08/2023   MCV 96.2 09/08/2023   PLT 121 (L) 09/08/2023   Most recent BMP    Latest Ref Rng & Units 09/08/2023    4:21 AM  BMP  Glucose 70 - 99 mg/dL 93   BUN 8 - 23 mg/dL 13   Creatinine 1.30 - 1.24 mg/dL 8.65   Sodium 784 - 696 mmol/L 140   Potassium 3.5 - 5.1 mmol/L 3.8   Chloride 98 - 111 mmol/L 107   CO2 22 - 32 mmol/L 26   Calcium 8.9 - 10.3 mg/dL 8.5    Ranata Laughery, DO 09/09/2023, 7:09 AM  PGY-1, [facility] FPTS Intern pager: [phone], text pages welcome Secure chat group [facility] Teaching Service

## 2023-09-09 NOTE — Assessment & Plan Note (Signed)
Hx of advanced dementia. - Holding Memantine and Donepezil - Delirium precautions

## 2023-09-09 NOTE — Evaluation (Signed)
Occupational Therapy Evaluation Patient Details Name: Andrew Osborne MRN: [id number] DOB: 17-Jun-1941 Today's Date: 09/09/2023   History of Present Illness Pt is 83 yo presenting to [facility] ED from [facility] due to aspiration of liquid causing respiratory distress. PMH significant of  advanced dementia, recurrent UTIs, pernicious anemia, Barret's esophagus, hiatal hernia, reported distal esophageal stricture requiring multiple dilations, Gilbert's syndrome, kidney disease, B12 deficiency, Raynaud's disease, and pre-diabetes.   Clinical Impression   Pt with baseline cognitive impairments, needing max to total A for ADLs due to lack of initiation and some processing impairments. Pt able to ambulate with min A + Rollator in halls, resides at a memory care unit at baseline. Pt would benefit from continued acute skilled OT services to address listed deficits and help transition to next level of care. Recommend pt return to memory care unit with 24/7 support from staffing.       If plan is discharge home, recommend the following: A little help with walking and/or transfers;A lot of help with bathing/dressing/bathroom;Direct supervision/assist for medications management;Assistance with cooking/housework;Supervision due to cognitive status;Direct supervision/assist for financial management;Assist for transportation    Functional Status Assessment  Patient has had a recent decline in their functional status and demonstrates the ability to make significant improvements in function in a reasonable and predictable amount of time.  Equipment Recommendations  Tub/shower seat    Recommendations for Other Services       Precautions / Restrictions Precautions Precautions: Fall Restrictions Weight Bearing Restrictions Per Provider Order: No      Mobility Bed Mobility Overal bed mobility: Needs Assistance Bed Mobility: Supine to Sit     Supine to sit: Mod assist     General bed  mobility comments: pt lethargic, not asissting much initially    Transfers Overall transfer level: Needs assistance Equipment used: Rollator (4 wheels) Transfers: Sit to/from Stand Sit to Stand: Min assist           General transfer comment: forward trunk posture      Balance Overall balance assessment: Needs assistance Sitting-balance support: Bilateral upper extremity supported, Feet supported Sitting balance-Leahy Scale: Fair Sitting balance - Comments: needing intermittent min A Postural control: Posterior lean Standing balance support: Bilateral upper extremity supported, During functional activity, Reliant on assistive device for balance Standing balance-Leahy Scale: Poor                             ADL either performed or assessed with clinical judgement   ADL Overall ADL's : Needs assistance/impaired Eating/Feeding: Sitting;Maximal assistance Eating/Feeding Details (indicate cue type and reason): daughter assisting with feeding Grooming: Maximal assistance;Sitting Grooming Details (indicate cue type and reason): lack of pt initiation Upper Body Bathing: Total assistance   Lower Body Bathing: Total assistance   Upper Body Dressing : Maximal assistance   Lower Body Dressing: Total assistance Lower Body Dressing Details (indicate cue type and reason): don shoes Toilet Transfer: Ambulation;Rollator (4 wheels);Minimal assistance   Toileting- Clothing Manipulation and Hygiene: Sit to/from stand;Maximal assistance       Functional mobility during ADLs: Rolling walker (2 wheels);Minimal assistance;Rollator (4 wheels)       Vision         Perception         Praxis         Pertinent Vitals/Pain Pain Assessment Pain Assessment: No/denies pain     Extremity/Trunk Assessment Upper Extremity Assessment Upper Extremity Assessment: Generalized weakness  Lower Extremity Assessment Lower Extremity Assessment: Generalized weakness   Cervical  / Trunk Assessment Cervical / Trunk Assessment: Kyphotic   Communication Communication Communication: Hearing impairment Cueing Techniques: Verbal cues;Tactile cues   Cognition Arousal: Lethargic Behavior During Therapy: WFL for tasks assessed/performed Overall Cognitive Status: History of cognitive impairments - at baseline                                 General Comments: follows commands with increased time and repetitive cues, some simple prompts easier for pt to follow than others     General Comments  Daughter present and supportive    Exercises     Shoulder Instructions      Home Living Family/patient expects to be discharged to:: Skilled nursing facility                                 Additional Comments: Pt is from [facility]      Prior Functioning/Environment Prior Level of Function : Needs assist             Mobility Comments: Uses Rollator occasionally but often forgets it or "loses" it in the facilty ADLs Comments: Pt requiring assist with toileting secondary to difficulty with clothing management. Requiring Max assist with dressing and Total assist with bathing and grooming.        OT Problem List: Decreased strength;Impaired balance (sitting and/or standing);Decreased cognition;Decreased safety awareness      OT Treatment/Interventions: Self-care/ADL training;DME and/or AE instruction;Therapeutic activities;Balance training;Therapeutic exercise;Patient/family education    OT Goals(Current goals can be found in the care plan section) Acute Rehab OT Goals Patient Stated Goal: to return home OT Goal Formulation: With patient Time For Goal Achievement: 09/23/23 Potential to Achieve Goals: Good ADL Goals Pt Will Perform Upper Body Bathing: sitting;with set-up Pt Will Perform Lower Body Bathing: sitting/lateral leans;with contact guard assist Pt Will Transfer to Toilet: with contact guard  assist;ambulating Pt Will Perform Toileting - Clothing Manipulation and hygiene: with contact guard assist;sitting/lateral leans  OT Frequency: Min 1X/week    Co-evaluation              AM-PAC OT "6 Clicks" Daily Activity     Outcome Measure Help from another person eating meals?: A Lot Help from another person taking care of personal grooming?: A Lot Help from another person toileting, which includes using toliet, bedpan, or urinal?: A Lot Help from another person bathing (including washing, rinsing, drying)?: A Lot Help from another person to put on and taking off regular upper body clothing?: A Lot Help from another person to put on and taking off regular lower body clothing?: Total 6 Click Score: 11   End of Session Equipment Utilized During Treatment: Gait belt;Rollator (4 wheels) Nurse Communication: Mobility status  Activity Tolerance: Patient tolerated treatment well Patient left: in chair;with call bell/phone within reach;with chair alarm set;with family/visitor present  OT Visit Diagnosis: Unsteadiness on feet (R26.81)                Time: 1250-1314 OT Time Calculation (min): 24 min Charges:  OT General Charges $OT Visit: 1 Visit OT Evaluation $OT Eval Moderate Complexity: 1 Mod OT Treatments $Therapeutic Activity: 8-22 mins  09/09/2023  AB, OTR/L  Acute Rehabilitation Services  Office: [phone]   Tristan Schroeder 09/09/2023, 2:14 PM

## 2023-09-09 NOTE — Discharge Summary (Addendum)
Family Medicine Teaching [facility] Discharge Summary  Patient name: Andrew Osborne Medical record number: [id number] Date of birth: 05-Feb-1941 Age: 83 y.o. Gender: male Date of Admission: 09/05/2023  Date of Discharge: 09/09/23 Admitting Physician: Levin Erp, MD  Primary Care Provider: Smiley Houseman, NP Consultants: palliative care  Indication for Hospitalization: acute hypoxic respiratory failure  Discharge Diagnoses/Problem List:  Principal Problem for Admission: acute hypoxic respiratory failure with hypoxia Other Problems addressed during stay:  Principal Problem:   Acute respiratory failure with hypoxia (HCC) Active Problems:   Sepsis [facility])   Mental status alteration    Brief Hospital Course:  Andrew Osborne is a 83 y.o.male with a history of chronic aspiration, dysphagia, Barret's esophagus, hiatal hernia, GERD, esophageal stricture with previous impaction requiring 3 dilations presenting from [facility] who was admitted to the [facility] Medicine Teaching Service at [facility] for hypoxia with gurgling respirations in respiratory distress. His hospital course is detailed below:  Acute Hypoxic Respiratory Failure Sepsis:  Presented to the ED for increased WOB likely secondary to aspiration event especially with history of chronic aspiration and esophageal stricture requiring dilations and tracheostomy for severe aspiration in the past. Abx given in hospital Zosyn (1/25-1/28), Vanc (1/25-1/26) with transition to Augmentin for oral abx.  Blood cultures showed NGTD x4d.   Unfortunately patient continued to have choking with feeding throughout the admission.  Was evaluated by SLP who recommended sitting up in chair for meals, following bites with sips, and offering frequent small meals instead of 3 large meals a day.  Recommended patient staying upright for at least 30 minutes after meals.  Also put patient on dysphagia 1 (puree) diet.  Given patient's persistent  high aspiration risk discussed palliative care with family.  After discussion family decided to discharge with hospice and stopped abx.   Urinary Retention  Required foley after multiple I/O. Was started on Doxazosin after admission as he could not take Flomax (unable to be crushed and could not swallow whole).  After speaking with daughter, decided to leave Foley catheter in on discharge for patient's comfort. Will send prescription for Doxazosin to SNF in the event that pt has a void trial and stays on that medication for comfort.   Other chronic conditions were medically managed with home medications and formulary alternatives as necessary (Dry eyes, GERD, Dementia, Insomnia, dizziness, pressure ulcer, gilberts)  SNF: SNF staff OR hospice staff is able to take out the foley when necessary.    Disposition: Back to [facility] (SNF)  Discharge Condition: stable  Discharge Exam:  Vitals:   09/08/23 1939 09/09/23 0449  BP: 96/63 106/72  Pulse: 65 (!) 53  Resp: 18 14  Temp: 98.2 F (36.8 C) 98.2 F (36.8 C)  SpO2: 95%    General: no acute distress, resting comfortably Cardiovascular: RRR, no m/r/g Respiratory: normal work of breathing on RA, CTAB Abdomen: Normal bowel sounds, soft, non-tender Extremities: No swelling BLE   Significant Procedures: none  Significant Labs and Imaging:  Recent Labs  Lab 09/08/23 0421  WBC 5.3  HGB 12.7*  HCT 37.7*  PLT 121*   Recent Labs  Lab 09/08/23 0421  NA 140  K 3.8  CL 107  CO2 26  GLUCOSE 93  BUN 13  CREATININE 1.18  CALCIUM 8.5*   CXR 09/05/23 IMPRESSION: 1. No acute cardiopulmonary findings. 2. Symmetric nodular densities at the lung bases are probably nipple shadows. Repeat chest x-ray with nipple recommended to confirm. 3. 3rd nodular density in  the left mid lung stable back to 07/28/2018 consistent with benign etiology.  CT Head WO Contrast 09/05/23 IMPRESSION: No evidence of acute intracranial  abnormality.  Results/Tests Pending at Time of Discharge: none  Discharge Medications:  Allergies as of 09/09/2023       Reactions   Nizoral [ketoconazole] Other (See Comments)   "Made fluid come through skin"  (was a combination of this drug w/ another drug pt can't remember)        Medication List     STOP taking these medications    cyanocobalamin 1000 MCG/ML injection Commonly known as: VITAMIN B12   donepezil 10 MG tablet Commonly known as: ARICEPT   erythromycin ophthalmic ointment   famotidine 20 MG tablet Commonly known as: PEPCID   GNP Chest Rub Oint   loperamide 2 MG tablet Commonly known as: IMODIUM A-D   meclizine 12.5 MG tablet Commonly known as: ANTIVERT   memantine 10 MG tablet Commonly known as: NAMENDA   omeprazole 20 MG capsule Commonly known as: PRILOSEC   tamsulosin 0.4 MG Caps capsule Commonly known as: FLOMAX   terbinafine 1 % cream Commonly known as: LAMISIL   Vitamin D 50 MCG (2000 UT) tablet       TAKE these medications    Artificial Tears 1 % ophthalmic solution Generic drug: carboxymethylcellulose Place 1 drop into both eyes 4 (four) times daily.   BAZA PROTECT EX Apply 1 application  topically at bedtime.   CalProtect 0.44-20.6 % Oint Generic drug: Menthol-Zinc Oxide Apply 1 Application topically at bedtime. Apply to buttocks for incontinence.   CORICIDIN HBP CONGESTION/COUGH PO Take 1 capsule by mouth every 6 (six) hours as needed (cold symptoms).   doxazosin 1 MG tablet Commonly known as: CARDURA Take 1 tablet (1 mg total) by mouth daily. Start taking on: September 10, 2023   Geri-Tussin 100 MG/5ML liquid Generic drug: guaiFENesin Take 10 mLs by mouth every 6 (six) hours as needed for cough.   Olopatadine HCl 0.2 % Soln Place 1 drop into both eyes daily.   traZODone 50 MG tablet Commonly known as: DESYREL TAKE 1 TABLET BY MOUTH EVERYDAY AT BEDTIME        Discharge Instructions: Please refer to  Patient Instructions section of EMR for full details.  Patient was counseled important signs and symptoms that should prompt return to medical care, changes in medications, dietary instructions, activity restrictions, and follow up appointments.   Follow-Up Appointments:   Para March, DO 09/09/2023, 11:58 AM PGY-1, [facility]  Upper Level Addendum:  I have seen and evaluated this patient along with Dr. Rexene Alberts and reviewed the above note, making necessary revisions as appropriate.  I agree with the medical decision making and physical exam as noted above.  Levin Erp, MD PGY-3 [facility] Res[facility]

## 2023-09-09 NOTE — Assessment & Plan Note (Addendum)
On RA, normal SPO2, normal WOB. RVP negative. Transitioned to PO abx yesterday.  - Palliative care consulted, appreciate recommendations - Zosyn (1/25-1/28), Vanc (1/25-1/26) - Transition to PO Augmentin 1/28 875-125mg  x5 doses - Albuterol PRN - IV Pantoprazole 40 daily - NTS PRN to clear secretions - Dysphagia 1 diet - SLP eval - AM CBC and BMP - Fall precautions - IV Tylenol PRN for pain and fevers - PT

## 2023-09-09 NOTE — Care Management Important Message (Signed)
Important Message  Patient Details  Name: Andrew Osborne MRN: [id number] Date of Birth: 04/09/1941   Important Message Given:  Yes - Medicare IM     Andrew Osborne 09/09/2023, 11:46 AM

## 2023-09-09 NOTE — NC FL2 (Signed)
[place] MEDICAID FL2 LEVEL OF CARE FORM     IDENTIFICATION  Patient Name: Andrew Osborne Birthdate: [birth year] Sex: male Admission Date (Current Location): 09/05/2023  [facility] and [place] Number:  Producer, television/film/video and Address:  [facility], [address], [place], [place] [address]      Provider Number: [id number]  Attending Physician Name and Address:  Latrelle Dodrill, MD  Relative Name and Phone Number:       Current Level of Care: Hospital Recommended Level of Care: Memory Care Prior Approval Number:    Date Approved/Denied:   PASRR Number:    Discharge Plan: Other (Comment) (Memory Care)    Current Diagnoses: Patient Active Problem List   Diagnosis Date Noted   Acute respiratory failure with hypoxia (HCC) 09/05/2023   Mental status alteration 09/05/2023   Pain due to onychomycosis of toenails of both feet 07/02/2023   Urinary tract infection 02/27/2023   Sepsis (HCC) 02/27/2023   Prediabetes 07/21/2019   Tinea corporis 12/09/2018   Esophageal thrush (HCC) 12/09/2018   Hyperglycemia 12/09/2018   Lower extremity edema 12/09/2018   Dementia without behavioral disturbance (HCC) 12/09/2018   Senile purpura (HCC) 04/29/2018   H/O benign neoplasm of bladder 04/29/2018   Basal cell carcinoma (BCC) of right cheek 04/29/2018   Raynaud's disease without gangrene 04/29/2018   Weight loss 04/29/2018   Benign prostatic hyperplasia (BPH) with straining on urination 04/29/2018   Pernicious anemia 04/29/2018   HTN (hypertension) 09/14/2013   B12 deficiency 08/31/2013   Proteins serum plasma low 08/31/2013   Hypogammaglobulinaemia, unspecified 08/31/2013   Thrush 08/23/2013   Rash and nonspecific skin eruption 01/17/2013   Gastroesophageal reflux disease 01/11/2013   Dyslipidemia 01/11/2013   Hematuria 01/11/2013   Weight loss, unintentional 01/11/2013   Cholelithiasis with cholecystitis 09/21/2012    Orientation RESPIRATION  BLADDER Height & Weight     Self  Normal Incontinent (Urethral catheter) Weight: 130 lb 4.8 oz (59.1 kg) Height:  5' 7.5" (171.5 cm)  BEHAVIORAL SYMPTOMS/MOOD NEUROLOGICAL BOWEL NUTRITION STATUS      Incontinent Diet (Puree)  AMBULATORY STATUS COMMUNICATION OF NEEDS Skin   Limited Assist Verbally Other (Comment) (Abrasion,arm,R,upper,Erythema,Buttocks,scrotum,Bil.,Wound/Incision LDAs,PI Buttocks stage 1)                       Personal Care Assistance Level of Assistance  Bathing, Feeding, Dressing Bathing Assistance: Limited assistance Feeding assistance: Limited assistance Dressing Assistance: Limited assistance     Functional Limitations Info  Sight, Hearing, Speech (WDL)          SPECIAL CARE FACTORS FREQUENCY                       Contractures Contractures Info: Not present    Additional Factors Info  Code Status, Allergies Code Status Info: DNR Allergies Info: Nizoral (ketoconazole)           TAKE these medications     Artificial Tears 1 % ophthalmic solution Generic drug: carboxymethylcellulose Place 1 drop into both eyes 4 (four) times daily.    BAZA PROTECT EX Apply 1 application  topically at bedtime.    CalProtect 0.44-20.6 % Oint Generic drug: Menthol-Zinc Oxide Apply 1 Application topically at bedtime. Apply to buttocks for incontinence.    CORICIDIN HBP CONGESTION/COUGH PO Take 1 capsule by mouth every 6 (six) hours as needed (cold symptoms).    doxazosin 1 MG tablet Commonly known as: CARDURA Take 1 tablet (  1 mg total) by mouth daily. Start taking on: September 10, 2023    Geri-Tussin 100 MG/5ML liquid Generic drug: guaiFENesin Take 10 mLs by mouth every 6 (six) hours as needed for cough.    Olopatadine HCl 0.2 % Soln Place 1 drop into both eyes daily.    traZODone 50 MG tablet Commonly known as: DESYREL TAKE 1 TABLET BY MOUTH EVERYDAY AT BEDTIME          Relevant Imaging Results:  Relevant Lab  Results:   Additional Information SSN-[id number]  Delilah Shan, LCSWA

## 2023-09-09 NOTE — Plan of Care (Signed)

## 2023-09-09 NOTE — Plan of Care (Signed)

## 2023-09-09 NOTE — Progress Notes (Addendum)
   Palliative Medicine Inpatient Follow Up Note HPI: 83 yo M with PMH as below, significant for dementia, chronic aspiration, dysphagia, Barret's esophagus, hiatal hernia, GERD, esophageal stricture with previous esophageal impaction requiring 3 dilations previously, anemia, BPH, frequent UTIs presenting from [facility] after being found down, hypoxic with gurgling respirations in resp distress. Palliative asked to get involved to further support goals of care conversations.   Today's Discussion 09/09/2023  *Please note that this is a verbal dictation therefore any spelling or grammatical errors are due to the "Dragon Medical One" system interpretation.  Chart reviewed inclusive of vital signs, progress notes, laboratory results, and diagnostic images.   I met with Molly Maduro at bedside in the presence of his daughter, [name] this morning.  We discussed that Vicent is more alert this morning than on previous days. He also ate more of his breakfast. We reviewed the plan for Christie to transition back to [facility] with hospice support.  We discussed that [name] has spoken to the primary team and she understands it has ben hard to determine what may be best for Dre though she does agree with transitioning goals towards hospice. I reviewed with [name] the level of support which would likely be provided.   We lamented on the type of man that Epifanio has been throughout his life. We discussed how active he has been and the impact he had on his grandchildren over the years.   Observed how difficult it is for [name] under the given circumstances and the thought of losing her father.   Discussed getting Amedio an activity apron for his fidgeting.   Questions and concerns addressed/Palliative Support Provided.   Objective Assessment: Vital Signs Vitals:   09/08/23 1939 09/09/23 0449  BP: 96/63 106/72  Pulse: 65 (!) 53  Resp: 18 14  Temp: 98.2 F (36.8 C) 98.2 F (36.8 C)  SpO2: 95%      Intake/Output Summary (Last 24 hours) at 09/09/2023 1053 Last data filed at 09/08/2023 2333 Gross per 24 hour  Intake 318.01 ml  Output 1675 ml  Net -1356.99 ml   Last Weight  Most recent update: 09/06/2023  8:33 AM    Weight  59.1 kg (130 lb 4.8 oz)            Gen: Elderly Caucasian male in no acute distress HEENT: moist mucous membranes CV: Regular rate and rhythm  PULM: On RA, breathing is even and nonlabored ABD: soft/nontender  EXT: No edema  Neuro: Alert and oriented to self  SUMMARY OF RECOMMENDATIONS   DNAR/DNI   MOST/ DNR Form Completed, paper copy placed onto the chart electric copy can be found in [facility]   AD's can be found in [place]   Plan to transition back to [facility] with Hospice support through [employer] based on MDM: Moderate  ______________________________________________________________________________________ [name] [place] Palliative Medicine Team Team Cell Phone: [phone] Please utilize secure chat with additional questions, if there is no response within 30 minutes please call the above phone number  Palliative Medicine Team providers are available by phone from 7am to 7pm daily and can be reached through the team cell phone.  Should this patient require assistance outside of these hours, please call the patient's attending physician.

## 2023-09-09 NOTE — Assessment & Plan Note (Addendum)
Afebrile, RR improved, BP improved with MAP >65. UA with large leukocytes, overall unchanged from prior U/A in July 2024, pending urine culture.  Blood culture no growth x4d. - Zosyn (1/25- 1/28) - Bcx NG x4d

## 2023-09-09 NOTE — TOC Transition Note (Signed)
Transition of Care [facility]) - Discharge Note   Patient Details  Name: Andrew Osborne MRN: [id number] Date of Birth: 1941/05/20  [facility] CM/SW Contact:  Delilah Shan, LCSWA Phone Number: 09/09/2023, 12:33 PM   Clinical Narrative:     Patient will [id number] with hospice services to follow.  Anticipated DC date: 09/09/2023  Family notified: [name]   Transport by: Sharin Mons  ?  Per MD patient ready for DC to [facility] with hospice services to follow . RN, patient, patient's family, Shawn with [facility],and facility notified of DC. Discharge Summary and FL2  sent to facility. RN given number for report [phone] [id number]. DC packet on chart. DNR signed by MD attached to patients DC packet.Ambulance transport requested for patient.  CSW signing off.   Final next level of care: Memory Care Barriers to Discharge: No Barriers Identified   Patient Goals and CMS Choice     Choice offered to / list presented to : Adult Children (Patients daughter [name])      Discharge Placement              Patient chooses bed at:  [facility]) Patient to be transferred to facility by: [facility] Name of family member notified: [name] Patient and family notified of of transfer: 09/09/23  Discharge Plan and Services Additional resources added to the After Visit Summary for   In-house Referral: Clinical Social Work                                   Social Drivers of Health (SDOH) Interventions SDOH Screenings   Food Insecurity: No Food Insecurity (09/05/2023)  Housing: Low Risk  (09/06/2023)  Transportation Needs: No Transportation Needs (09/05/2023)  Utilities: Not At Risk (09/05/2023)  Alcohol Screen: Low Risk  (08/02/2018)  Depression (PHQ2-9): Low Risk  (10/25/2021)  Social Connections: Unknown (09/05/2023)  Tobacco Use: Low Risk  (09/05/2023)     Readmission Risk Interventions     No data to display

## 2023-09-10 LAB — CULTURE, BLOOD (ROUTINE X 2)
Culture: NO GROWTH
Culture: NO GROWTH

## 2023-12-10 DEATH — deceased
